# Patient Record
Sex: Male | Born: 1955
Health system: Southern US, Community
[De-identification: ages and names within clinical notes are randomized; demographics above are authoritative.]

## PROBLEM LIST (undated history)

## (undated) DIAGNOSIS — Z87442 Personal history of urinary calculi: Secondary | ICD-10-CM

## (undated) DIAGNOSIS — R0789 Other chest pain: Secondary | ICD-10-CM

## (undated) DIAGNOSIS — Z972 Presence of dental prosthetic device (complete) (partial): Secondary | ICD-10-CM

## (undated) DIAGNOSIS — N200 Calculus of kidney: Secondary | ICD-10-CM

## (undated) DIAGNOSIS — R0609 Other forms of dyspnea: Secondary | ICD-10-CM

## (undated) DIAGNOSIS — Z8489 Family history of other specified conditions: Secondary | ICD-10-CM

## (undated) DIAGNOSIS — G629 Polyneuropathy, unspecified: Secondary | ICD-10-CM

## (undated) DIAGNOSIS — N400 Enlarged prostate without lower urinary tract symptoms: Secondary | ICD-10-CM

## (undated) DIAGNOSIS — K219 Gastro-esophageal reflux disease without esophagitis: Secondary | ICD-10-CM

## (undated) DIAGNOSIS — R0600 Dyspnea, unspecified: Secondary | ICD-10-CM

## (undated) DIAGNOSIS — K08109 Complete loss of teeth, unspecified cause, unspecified class: Secondary | ICD-10-CM

## (undated) DIAGNOSIS — Z9889 Other specified postprocedural states: Secondary | ICD-10-CM

## (undated) DIAGNOSIS — M1990 Unspecified osteoarthritis, unspecified site: Secondary | ICD-10-CM

## (undated) DIAGNOSIS — R112 Nausea with vomiting, unspecified: Secondary | ICD-10-CM

## (undated) DIAGNOSIS — E785 Hyperlipidemia, unspecified: Secondary | ICD-10-CM

## (undated) DIAGNOSIS — I10 Essential (primary) hypertension: Secondary | ICD-10-CM

## (undated) DIAGNOSIS — Z973 Presence of spectacles and contact lenses: Secondary | ICD-10-CM

## (undated) DIAGNOSIS — R000 Tachycardia, unspecified: Secondary | ICD-10-CM

## (undated) HISTORY — PX: CYSTOSCOPY W/ URETERAL STENT PLACEMENT: SHX1429

## (undated) HISTORY — PX: TONSILLECTOMY AND ADENOIDECTOMY: SUR1326

## (undated) HISTORY — DX: Polyneuropathy, unspecified: G62.9

## (undated) HISTORY — PX: LUMBAR DISC SURGERY: SHX700

## (undated) HISTORY — PX: CARDIOVASCULAR STRESS TEST: SHX262

## (undated) HISTORY — PX: TOTAL SHOULDER ARTHROPLASTY: SHX126

## (undated) HISTORY — DX: Unspecified osteoarthritis, unspecified site: M19.90

## (undated) HISTORY — PX: ANTERIOR CERVICAL DECOMP/DISCECTOMY FUSION: SHX1161

## (undated) HISTORY — DX: Other forms of dyspnea: R06.09

## (undated) HISTORY — PX: CYSTOSCOPY/RETROGRADE/URETEROSCOPY/STONE EXTRACTION WITH BASKET: SHX5317

## (undated) HISTORY — DX: Dyspnea, unspecified: R06.00

## (undated) HISTORY — PX: LUMBAR FUSION: SHX111

## (undated) HISTORY — PX: TRANSTHORACIC ECHOCARDIOGRAM: SHX275

## (undated) HISTORY — DX: Hyperlipidemia, unspecified: E78.5

## (undated) HISTORY — PX: SPINE SURGERY: SHX786

---

## 1995-04-27 HISTORY — PX: LUMBAR SPINE SURGERY: SHX701

## 1999-08-25 ENCOUNTER — Encounter: Admission: RE | Admit: 1999-08-25 | Discharge: 1999-09-25 | Payer: Self-pay | Admitting: Endocrinology

## 1999-11-29 ENCOUNTER — Emergency Department (HOSPITAL_COMMUNITY): Admission: EM | Admit: 1999-11-29 | Discharge: 1999-11-29 | Payer: Self-pay | Admitting: Emergency Medicine

## 1999-11-29 ENCOUNTER — Encounter: Payer: Self-pay | Admitting: Emergency Medicine

## 1999-12-04 ENCOUNTER — Encounter: Payer: Self-pay | Admitting: Emergency Medicine

## 1999-12-04 ENCOUNTER — Inpatient Hospital Stay (HOSPITAL_COMMUNITY): Admission: EM | Admit: 1999-12-04 | Discharge: 1999-12-06 | Payer: Self-pay | Admitting: Emergency Medicine

## 1999-12-04 ENCOUNTER — Encounter: Payer: Self-pay | Admitting: Urology

## 1999-12-07 ENCOUNTER — Encounter: Admission: RE | Admit: 1999-12-07 | Discharge: 1999-12-07 | Payer: Self-pay | Admitting: Urology

## 1999-12-07 ENCOUNTER — Encounter: Payer: Self-pay | Admitting: Urology

## 1999-12-14 ENCOUNTER — Ambulatory Visit (HOSPITAL_COMMUNITY): Admission: RE | Admit: 1999-12-14 | Discharge: 1999-12-14 | Payer: Self-pay | Admitting: Urology

## 2000-04-15 ENCOUNTER — Ambulatory Visit (HOSPITAL_COMMUNITY): Admission: RE | Admit: 2000-04-15 | Discharge: 2000-04-15 | Payer: Self-pay | Admitting: Neurosurgery

## 2000-04-15 ENCOUNTER — Encounter: Payer: Self-pay | Admitting: Neurosurgery

## 2000-05-10 ENCOUNTER — Ambulatory Visit (HOSPITAL_COMMUNITY): Admission: RE | Admit: 2000-05-10 | Discharge: 2000-05-10 | Payer: Self-pay | Admitting: Neurosurgery

## 2000-05-10 ENCOUNTER — Encounter: Payer: Self-pay | Admitting: Neurosurgery

## 2000-05-24 ENCOUNTER — Inpatient Hospital Stay (HOSPITAL_COMMUNITY): Admission: RE | Admit: 2000-05-24 | Discharge: 2000-05-26 | Payer: Self-pay | Admitting: Neurosurgery

## 2000-05-24 ENCOUNTER — Encounter: Payer: Self-pay | Admitting: Neurosurgery

## 2000-07-04 ENCOUNTER — Encounter: Admission: RE | Admit: 2000-07-04 | Discharge: 2000-07-04 | Payer: Self-pay | Admitting: Neurosurgery

## 2000-07-04 ENCOUNTER — Encounter: Payer: Self-pay | Admitting: Neurosurgery

## 2000-08-12 ENCOUNTER — Encounter: Payer: Self-pay | Admitting: Urology

## 2000-08-12 ENCOUNTER — Encounter: Admission: RE | Admit: 2000-08-12 | Discharge: 2000-08-12 | Payer: Self-pay | Admitting: Urology

## 2000-08-15 ENCOUNTER — Encounter: Admission: RE | Admit: 2000-08-15 | Discharge: 2000-08-15 | Payer: Self-pay | Admitting: Neurosurgery

## 2000-08-15 ENCOUNTER — Encounter: Payer: Self-pay | Admitting: Neurosurgery

## 2000-08-16 ENCOUNTER — Ambulatory Visit (HOSPITAL_BASED_OUTPATIENT_CLINIC_OR_DEPARTMENT_OTHER): Admission: RE | Admit: 2000-08-16 | Discharge: 2000-08-16 | Payer: Self-pay | Admitting: Orthopedic Surgery

## 2000-08-19 ENCOUNTER — Encounter: Admission: RE | Admit: 2000-08-19 | Discharge: 2000-09-15 | Payer: Self-pay | Admitting: Neurosurgery

## 2000-10-06 ENCOUNTER — Ambulatory Visit (HOSPITAL_COMMUNITY): Admission: RE | Admit: 2000-10-06 | Discharge: 2000-10-06 | Payer: Self-pay | Admitting: Neurosurgery

## 2001-05-22 ENCOUNTER — Encounter: Admission: RE | Admit: 2001-05-22 | Discharge: 2001-05-22 | Payer: Self-pay | Admitting: Neurosurgery

## 2001-05-22 ENCOUNTER — Encounter: Payer: Self-pay | Admitting: Neurosurgery

## 2001-06-13 ENCOUNTER — Ambulatory Visit (HOSPITAL_COMMUNITY): Admission: RE | Admit: 2001-06-13 | Discharge: 2001-06-13 | Payer: Self-pay | Admitting: Neurosurgery

## 2001-06-13 ENCOUNTER — Encounter: Payer: Self-pay | Admitting: Neurosurgery

## 2001-06-26 ENCOUNTER — Encounter: Payer: Self-pay | Admitting: Neurosurgery

## 2001-06-29 ENCOUNTER — Inpatient Hospital Stay (HOSPITAL_COMMUNITY): Admission: RE | Admit: 2001-06-29 | Discharge: 2001-07-03 | Payer: Self-pay | Admitting: Neurosurgery

## 2001-06-29 ENCOUNTER — Encounter: Payer: Self-pay | Admitting: Neurosurgery

## 2003-08-17 ENCOUNTER — Emergency Department (HOSPITAL_COMMUNITY): Admission: EM | Admit: 2003-08-17 | Discharge: 2003-08-17 | Payer: Self-pay | Admitting: Emergency Medicine

## 2004-03-09 ENCOUNTER — Ambulatory Visit (HOSPITAL_COMMUNITY): Admission: RE | Admit: 2004-03-09 | Discharge: 2004-03-10 | Payer: Self-pay | Admitting: Neurosurgery

## 2005-11-01 ENCOUNTER — Observation Stay (HOSPITAL_COMMUNITY): Admission: AD | Admit: 2005-11-01 | Discharge: 2005-11-03 | Payer: Self-pay | Admitting: Neurosurgery

## 2005-11-01 HISTORY — PX: CERVICAL FUSION: SHX112

## 2006-06-06 ENCOUNTER — Inpatient Hospital Stay (HOSPITAL_COMMUNITY): Admission: RE | Admit: 2006-06-06 | Discharge: 2006-06-08 | Payer: Self-pay | Admitting: Neurosurgery

## 2006-06-06 HISTORY — PX: POSTERIOR FUSION CERVICAL SPINE: SUR628

## 2006-08-30 ENCOUNTER — Encounter (INDEPENDENT_AMBULATORY_CARE_PROVIDER_SITE_OTHER): Payer: Self-pay | Admitting: Specialist

## 2006-08-30 ENCOUNTER — Ambulatory Visit (HOSPITAL_COMMUNITY): Admission: RE | Admit: 2006-08-30 | Discharge: 2006-08-30 | Payer: Self-pay | Admitting: *Deleted

## 2007-11-10 ENCOUNTER — Ambulatory Visit (HOSPITAL_COMMUNITY): Admission: RE | Admit: 2007-11-10 | Discharge: 2007-11-10 | Payer: Self-pay | Admitting: Urology

## 2008-04-29 ENCOUNTER — Ambulatory Visit (HOSPITAL_COMMUNITY): Admission: RE | Admit: 2008-04-29 | Discharge: 2008-04-30 | Payer: Self-pay | Admitting: Neurosurgery

## 2008-07-15 ENCOUNTER — Emergency Department (HOSPITAL_COMMUNITY): Admission: EM | Admit: 2008-07-15 | Discharge: 2008-07-15 | Payer: Self-pay | Admitting: Emergency Medicine

## 2008-09-26 ENCOUNTER — Encounter: Admission: RE | Admit: 2008-09-26 | Discharge: 2008-09-26 | Payer: Self-pay | Admitting: Neurosurgery

## 2008-10-15 ENCOUNTER — Inpatient Hospital Stay (HOSPITAL_COMMUNITY): Admission: RE | Admit: 2008-10-15 | Discharge: 2008-10-18 | Payer: Self-pay | Admitting: Neurosurgery

## 2009-07-27 HISTORY — PX: IRRIGATION AND DEBRIDEMENT ABSCESS: SHX5252

## 2009-08-06 ENCOUNTER — Ambulatory Visit (HOSPITAL_BASED_OUTPATIENT_CLINIC_OR_DEPARTMENT_OTHER): Admission: RE | Admit: 2009-08-06 | Discharge: 2009-08-06 | Payer: Self-pay | Admitting: Orthopedic Surgery

## 2010-01-08 ENCOUNTER — Inpatient Hospital Stay (HOSPITAL_COMMUNITY): Admission: RE | Admit: 2010-01-08 | Discharge: 2010-01-09 | Payer: Self-pay | Admitting: Orthopedic Surgery

## 2010-07-09 LAB — URINALYSIS, ROUTINE W REFLEX MICROSCOPIC
Bilirubin Urine: NEGATIVE
Glucose, UA: NEGATIVE mg/dL
Hgb urine dipstick: NEGATIVE
Ketones, ur: NEGATIVE mg/dL
Nitrite: NEGATIVE
Protein, ur: NEGATIVE mg/dL
Specific Gravity, Urine: 1.02 (ref 1.005–1.030)
Urobilinogen, UA: 0.2 mg/dL (ref 0.0–1.0)
pH: 5.5 (ref 5.0–8.0)

## 2010-07-09 LAB — COMPREHENSIVE METABOLIC PANEL
ALT: 21 U/L (ref 0–53)
AST: 27 U/L (ref 0–37)
Albumin: 4.2 g/dL (ref 3.5–5.2)
Alkaline Phosphatase: 106 U/L (ref 39–117)
BUN: 7 mg/dL (ref 6–23)
CO2: 30 mEq/L (ref 19–32)
Calcium: 9.4 mg/dL (ref 8.4–10.5)
Chloride: 102 mEq/L (ref 96–112)
Creatinine, Ser: 0.9 mg/dL (ref 0.4–1.5)
GFR calc Af Amer: >60 mL/min (ref >60)
GFR calc non Af Amer: >60 mL/min (ref >60)
Glucose, Bld: 96 mg/dL (ref 70–99)
Potassium: 4.1 mEq/L (ref 3.5–5.1)
Sodium: 138 mEq/L (ref 135–145)
Total Bilirubin: 0.6 mg/dL (ref 0.3–1.2)
Total Protein: 6.8 g/dL (ref 6.0–8.3)

## 2010-07-09 LAB — SURGICAL PCR SCREEN
MRSA, PCR: NEGATIVE
Staphylococcus aureus: POSITIVE — AB

## 2010-07-09 LAB — CBC
HCT: 37.7 % — ABNORMAL LOW (ref 39.0–52.0)
Hemoglobin: 12.3 g/dL — ABNORMAL LOW (ref 13.0–17.0)
MCH: 28.3 pg (ref 26.0–34.0)
MCHC: 32.6 g/dL (ref 30.0–36.0)
MCV: 86.9 fL (ref 78.0–100.0)
Platelets: 168 10*3/uL (ref 150–400)
RBC: 4.34 MIL/uL (ref 4.22–5.81)
RDW: 14.1 % (ref 11.5–15.5)
WBC: 5 10*3/uL (ref 4.0–10.5)

## 2010-07-09 LAB — PROTIME-INR
INR: 1.02 (ref 0.00–1.49)
Prothrombin Time: 13.6 seconds (ref 11.6–15.2)

## 2010-07-09 LAB — APTT: aPTT: 28 seconds (ref 24–37)

## 2010-07-15 LAB — BASIC METABOLIC PANEL
BUN: 7 mg/dL (ref 6–23)
CO2: 29 mEq/L (ref 19–32)
Calcium: 9.3 mg/dL (ref 8.4–10.5)
Chloride: 103 mEq/L (ref 96–112)
Creatinine, Ser: 0.73 mg/dL (ref 0.4–1.5)
GFR calc Af Amer: >60 mL/min (ref >60)
GFR calc non Af Amer: >60 mL/min (ref >60)
Glucose, Bld: 114 mg/dL — ABNORMAL HIGH (ref 70–99)
Potassium: 3.8 mEq/L (ref 3.5–5.1)
Sodium: 139 mEq/L (ref 135–145)

## 2010-07-15 LAB — WOUND CULTURE

## 2010-07-15 LAB — ANAEROBIC CULTURE

## 2010-07-15 LAB — POCT HEMOGLOBIN-HEMACUE: Hemoglobin: 12 g/dL — ABNORMAL LOW (ref 13.0–17.0)

## 2010-08-03 LAB — URINALYSIS, ROUTINE W REFLEX MICROSCOPIC
Bilirubin Urine: NEGATIVE
Glucose, UA: NEGATIVE mg/dL
Hgb urine dipstick: NEGATIVE
Ketones, ur: NEGATIVE mg/dL
Nitrite: NEGATIVE
Protein, ur: NEGATIVE mg/dL
Specific Gravity, Urine: 1.018 (ref 1.005–1.030)
Urobilinogen, UA: 0.2 mg/dL (ref 0.0–1.0)
pH: 6 (ref 5.0–8.0)

## 2010-08-03 LAB — PROTIME-INR
INR: 1 (ref 0.00–1.49)
Prothrombin Time: 13.6 seconds (ref 11.6–15.2)

## 2010-08-03 LAB — BASIC METABOLIC PANEL
BUN: 19 mg/dL (ref 6–23)
CO2: 27 mEq/L (ref 19–32)
Calcium: 9.4 mg/dL (ref 8.4–10.5)
Chloride: 97 mEq/L (ref 96–112)
Creatinine, Ser: 1.12 mg/dL (ref 0.4–1.5)
GFR calc Af Amer: >60 mL/min (ref >60)
GFR calc non Af Amer: >60 mL/min (ref >60)
Glucose, Bld: 104 mg/dL — ABNORMAL HIGH (ref 70–99)
Potassium: 4.4 mEq/L (ref 3.5–5.1)
Sodium: 133 mEq/L — ABNORMAL LOW (ref 135–145)

## 2010-08-03 LAB — TYPE AND SCREEN
ABO/RH(D): O POS
Antibody Screen: NEGATIVE

## 2010-08-03 LAB — CBC
HCT: 40.5 % (ref 39.0–52.0)
Hemoglobin: 13.7 g/dL (ref 13.0–17.0)
MCHC: 33.8 g/dL (ref 30.0–36.0)
MCV: 93.4 fL (ref 78.0–100.0)
Platelets: 167 10*3/uL (ref 150–400)
RBC: 4.33 MIL/uL (ref 4.22–5.81)
RDW: 13.6 % (ref 11.5–15.5)
WBC: 6.6 10*3/uL (ref 4.0–10.5)

## 2010-08-03 LAB — APTT: aPTT: 28 seconds (ref 24–37)

## 2010-09-08 NOTE — Op Note (Signed)
NAME:  Dustin Carter, Dustin Carter NO.:  [id number]   MEDICAL RECORD NO.:  [id number]          PATIENT TYPE:  INP   LOCATION:  3030                         FACILITY:  [facility]   PHYSICIAN:  Clydene Fake, M.D.  DATE OF BIRTH:  1955/10/16   DATE OF PROCEDURE:  10/15/2008  DATE OF DISCHARGE:                               OPERATIVE REPORT   PREOPERATIVE DIAGNOSES:  Lumbar stenosis and spondylosis at L4-5 prior  with prior surgery and prior fusion above this.   POSTOPERATIVE DIAGNOSES:  Lumbar stenosis and spondylosis at L4-5 prior  with prior surgery and prior fusion above this.   PROCEDURE:  Redo decompressive laminectomy at L4 and L5 (2 levels),  posterior lumbar interbody fusion at L4, Saber interbody cages at 4-5,  revision at the left L3 and bilateral L4 pedicle screws with segmented  pedicle fixation at L3 through L5 with expedient screws, posterolateral  fusion at L3 through L5 (2 levels), autograft, same incision, allograft  with Infuse BMP.   SURGEON:  Clydene Fake, MD   ASSISTANT:  None.   ANESTHESIA:  General endotracheal tube anesthesia.   ESTIMATED BLOOD LOSS:  350 mL.   BLOOD GIVEN:  None.   DRAINS:  None.   COMPLICATIONS:  None.   REASON FOR PROCEDURE:  The patient is a 55 year old gentleman who has  had an L1 to L4 fusion about 9 years ago.  He has had  other multiple  lumbar surgeries in the past also.  He developed transitional syndrome  at 4-5 with increased spondylosis and stenosis.  The patient was brought  in for decompression and fusion.   PROCEDURE IN DETAIL:  The patient was brought into the operating room  and general anesthesia was induced.  The patient was placed in the prone  position on a Wilson frame with all pressure points padded.  The patient  was prepped and draped in sterile fashion.  A standard incision was  injected with 20 mL of 1% lidocaine with epinephrine.  Incision was then  made beside previous incision at the lower  lumbar spine from about L3  down to L5.  Incision was taken down to the fascia.  Hemostasis obtained  with Bovie cauterization.  Fascia was incised and subperiosteal  dissection was done over the L4 spinous process and lamina up to the  facets.  We dissected up cephalad.  We found the old pedicle screw  system and rods.  We uncovered the screws at L4 and L3 including the  rods and exposed that.  The patient had a Zimmer XP 360instrumentation  and explored options extending transplantation down at one level, but  could not see any good way any rod connectors would  work appropriately,  so a high-speed drill rod cutter was used to cut the rods of the right  side at 3-4, on the left side between 2-3.  We then removed the rods  between the pedicle screws at right side at L4 and left side at L3 and  4.  The four screws were loose bilaterally.  We removed the excess  screws on the left side and the screws broke and the distal end was  still a vertical body and  that was left in place.  Then, at the left  side of L3, we placed a larger XP pedicle screw.  This was 7-mm wide by  __________, 0.5 mm deeper screw and on the right-side at L4, we replaced  that screw with a larger screw 7 mm x 45 mm screw.  We then returned  back to the 4-5 interspace.  We dissected out laterally the transverse  process of the 4-5.  Self retaining retractor was then placed there and  decompressive laminectomy was done, decompressing most of spinous  process and lamina at the top part of  5, decompressing the central  canal in the L4 and L5 nerve roots bilaterally.  Aggressive medial  facetectomies also were done to make sure that the lateral gutter was  well decompressed.  We decompressed more than was needed for just  standard interbody fusion.  Disk space was incised bilateral at the 4-5  level and diskectomy done with pituitary rongeurs, we distracted the  interspace up to 11 mm with interspace distractors.  We  continued to  prepare the interspace for interbody fusion using curettes, pituitary  rongeurs, and various broaches from the Saber system and two 11 x 9 wide  Saber interbody cages were packed with Infuse BMP and autograft bone.  All the bone removed during laminectomy was cleaned from its soft tissue  cut into small pieces and this bone was then packed in in the interbody  cages, also packed in the interspace and then we tapped our cages into  position and explored thecal sac and nerve roots.  We had good  decompression of the central canal and the 4/5 nerve roots bilaterally.  Attention was then taken to the pedicle entry points at L5 .  It was  decorticated with high-speed drill.  Pedicle probe placed on the pedicle  was tapped up with a small ball probe to ensure we had good bony  circumference and pedicle screw placed, 6 x 50 mm screws were used at  L5.  Then, on the left-side at L4, we put a pedicle probe down pedicle  and anterior intervertebral body adjacent to the broken screw, we then  tapped this and placed a 6 wide by 50-mm screw there.  Rods were then  placed in the screw heads on the right side between 4 and 5, and at the  left side between L3, 4, and 5 and then locking nuts placed and final  tightened.  Final AP and lateral fluoroscopic imaging showed good  position of interbody cages pedicle screw fixation.  Decorticated  lateral facets and transverse processes of L3 through 5, packed the  sterile gutters with Infuse BMP and autograft bone for posterolateral  fusion from L3 to L5.   Next we explored the dura and nerve roots.  There was a questionable  area in the dura, but with exploration of this with hooks and Valsalva,  no CSF leak was seen and Duraseal was placed over this area for  added  protection.  Retractors removed.  We had good hemostasis.  Fascia closed  with #0 Vicryl interrupted sutures.  Subcutaneous tissue closed with 2-0  and 3-0 Vicryl interrupted  sutures.  Skin closed with benzoin and Steri-  Strips.  Dressing was placed.  The patient was placed back in supine  position, awoken from anesthesia, and transferred to recovery room  in a  stable condition.           ______________________________  Clydene Fake, M.D.     JRH/MEDQ  D:  10/15/2008  T:  10/16/2008  Job:  [id number]

## 2010-09-08 NOTE — Op Note (Signed)
NAME:  Dustin Carter, CHAIKIN                 ACCOUNT NO.:  [id number]   MEDICAL RECORD NO.:  [id number]          PATIENT TYPE:  AMB   LOCATION:  DAY                          FACILITY:  [facility]   PHYSICIAN:  Sigmund I. Patsi Sears, M.D.DATE OF BIRTH:  07-11-1955   DATE OF PROCEDURE:  11/10/2007  DATE OF DISCHARGE:                               OPERATIVE REPORT   PREOPERATIVE DIAGNOSIS:  Right ureteral stone.   POSTOPERATIVE DIAGNOSIS:  Negative right ureteroscopy.   PROCEDURE:  1. Cystourethroscopy.  2. Right retrograde pyelogram.  3. Right ureteroscopy, semi rigid.   ATTENDING PHYSICIAN:  Sigmund I. Patsi Sears, M.D.   RESIDENT PHYSICIAN:  Dr. Delman Kitten.   ANESTHESIA:  Was general.   INDICATIONS FOR PROCEDURE:  Dustin Carter is a 55 year old white male who  saw Dr. Patsi Sears at the beginning of July complaining of right-sided  abdominal pain.  CT stone study at that time demonstrated a 3.5 to 4 mm  obstructing stone in the right mid ureter with perinephric soft tissue  stranding secondary to interstitial backflow.  His pain has improved,  however, he does not recall passing a stone per se.  He has been on  medical expulsive therapy since.  He is being brought back today for  further evaluation and definitive management of his stone if indicated.   PROCEDURE IN DETAIL:  The patient was brought to the operating room and  placed in supine position.  He was correctly identified by his wristband  and appropriate time-out was taken.  IV antibiotics were administered  and general anesthesia was delivered.  Once adequately anesthetized he  was placed in dorsal lithotomy position with great care taken to  minimize the risk of peripheral neuropathy or compartment syndrome.  His  perineum was prepped and draped sterilely.  We began our procedure by  performing rigid cystourethroscopy with a 22 French rigid cystoscopic  sheath and 12 degree lens and sterile water as our irrigant.  His  urethra was  normal in its course and caliber.  On entering the bladder  clear urine was identified.  Pan cystoscopy did not reveal any stones or  urothelial abnormality.  Both ureteral orifices were noted to be in  their normal anatomic position effluxing clear urine.  His right  ureteral orifice was cannulated with a 6 French end-hole catheter and  right retrograde pyelogram was performed.  There was a mobile filling  defect consistent with a bubble but otherwise there were no fixed  filling defects or radiopacities suggestive of stone either in the  ureter or the kidney.  His ureter was widely patent throughout its  entire length without evidence of stricture.  We then advanced a sensor  tip guidewire through the cystoscope and directed it to the level of the  right renal pelvis using fluoroscopy.  We then removed the cystoscope  and performed semi-rigid ureteroscopy.  The ureteral orifice was not  significantly difficult to cannulate and again the right ureter was  widely patent up to the UPJ.  There were no strictures.  There was no  evidence of edema or urothelial injury  nor were there any stones seen.  Likewise we slowly backed out the semi-rigid ureteroscope taking a  second very careful look at the ureter and no abnormalities were seen.  Likewise we removed the ureteroscope and the guidewire and replaced the  cystoscope back into his bladder to drain his bladder from urine and  this marked the  end of our procedure.  He tolerated the procedure well.  There were no  complications.  He awoke from anesthesia and was taken to the recovery  room in stable condition.  Dr. Patsi Sears was present and participated  in all aspects of the case.     ______________________________  Dr [place] Constable I. Patsi Sears, M.D.  Electronically Signed    DW/MEDQ  D:  11/10/2007  T:  11/10/2007  Job:  [id number]

## 2010-09-08 NOTE — Op Note (Signed)
NAME:  Dustin Carter, Dustin Carter NO.:  [id number]   MEDICAL RECORD NO.:  [id number]          PATIENT TYPE:  OIB   LOCATION:  3535                         FACILITY:  [facility]   PHYSICIAN:  Clydene Fake, M.D.  DATE OF BIRTH:  April 12, 1956   DATE OF PROCEDURE:  04/29/2008  DATE OF DISCHARGE:                               OPERATIVE REPORT   PREOPERATIVE DIAGNOSES:  Herniated nucleus pulposus and spondylosis, C4-  5 with right-sided radiculopathy.   POSTOPERATIVE DIAGNOSES:  Herniated nucleus pulposus and spondylosis, C4-  5 with right-sided radiculopathy.   PROCEDURE:  Anterior cervical decompression, diskectomy, and fusion at  C4-5 with LifeNet allograft bone and Trestle anterior cervical plate.   SURGEON:  Clydene Fake, MD   ASSISTANT:  Coletta Memos, MD   ANESTHESIA:  General endotracheal tube anesthesia.   ESTIMATED BLOOD LOSS:  Minimal.   BLOOD GIVEN:  None.   DRAINS:  None.   COMPLICATIONS:  None.   REASON FOR PROCEDURE:  The patient is a 55 year old gentleman with neck  and right shoulder and arm pain and numbness.  He has had a prior fusion  at anterior and posterior C5 through T1.  MRI and x-ray were done  showing some decreased disk height, progressive at C4-C5 with some  movement between flexion and extension, showing some instability.  An  MRI shows biforaminal narrowing, worse at the right due to spondylitic  change and some disk herniation.  The patient was brought in for  decompression and fusion.   PROCEDURE IN DETAIL:  The patient was brought to the operating room.  General anesthesia was induced.  The patient was placed in 10-pound  halter traction, prepped and draped in sterile fashion.  Site of  incision was injected with 10 mL of 1% lidocaine with epinephrine.  An  incision was then made in the midline to the anterior border of the  sternocleidomastoid muscle on the left side.  The neck incision was  taken down to the platysma and hemostasis  was obtained with Bovie  cauterization.  The platysma was incised with the Bovie and blunt  dissection and taken through the anterior cervical fascia of the  anterior cervical spine.  Needle was placed at the interspace to the  previous plate and was entered at a level below.  X-rays were obtained  showing the needle was in the C4-5 interspace.  Disk space was incised  with 15-blade.  Partial diskectomy was performed and the needle was  removed.  Longus coli muscle was reflected laterally using the Bovie on  each side.  A self-retaining retractor was then placed and anterior  osteophytes were removed with Leksell rongeurs and Kerrison punches.  Diskectomy continued with pituitary rongeurs and curettes.  Microscope  was then brought in for microdissection.  Distraction pins were placed  in the C4-5 and the interspace was distracted and then the diskectomy  was continued with 1 and 2-mm Kerrison punches, removing posterior disk  osteophytes, ligament disk herniation, decompressing the central canal  and performing bilateral foraminotomies.  Once we had finished, we had  good  decompression of the central canal, each foramen, and nerve root.  We used high-speed drill to remove cartilaginous endplate, measured the  height of disk space to be 5 mm and 5 mm LifeNet allograft bone was  tapped into place and countersunk couple of millimeters.  Distraction  pins were removed.  Hemostasis was obtained with Gelfoam and thrombin.  Weight was removed from the traction.  Bone was firmly in place and the  Trestle anterior cervical plate was placed over the anterior cervical  spine and 2 screws was placed in C4 and 2 in the C5.  These were  tightened down.  Lateral x-rays obtained showing good position of plate,  screws, and bone plug at the C4-5 level.  Retractors were removed.  Hemostasis was obtained with bipolar cauterization.  We irrigated with  antibiotic solution and had good hemostasis and the  platysma was closed  with 3-0 Vicryl interrupted sutures.  Subcutaneous tissue closed with  same.  Skin was closed with benzoin and Steri-Strips.  A dressing was  placed.  The patient was placed in a cervical collar, awoken from  anesthesia, and transferred to recovery room in stable condition.           ______________________________  Clydene Fake, M.D.     JRH/MEDQ  D:  04/29/2008  T:  04/30/2008  Job:  [id number]

## 2010-09-11 NOTE — Op Note (Signed)
NAME:  Dustin Carter, Dustin Carter NO.:  [id number]   MEDICAL RECORD NO.:  [id number]          PATIENT TYPE:  AMB   LOCATION:  ENDO                         FACILITY:  [facility]   PHYSICIAN:  Georgiana Spinner, M.D.    DATE OF BIRTH:  03/31/1956   DATE OF PROCEDURE:  08/30/2006  DATE OF DISCHARGE:                               OPERATIVE REPORT   PROCEDURE:  Upper endoscopy.   INDICATIONS:  Gastroesophageal reflux disease.   ANESTHESIA:  Demerol 70 mg, Versed 7 mg.   PROCEDURE:  With the patient mildly sedated in the left lateral  decubitus position the Pentax videoscopic endoscope was inserted and  passed under direct vision through the esophagus which appeared normal  until we reached distal esophagus.  There was [address] of [place]  photographed and biopsied.  We entered into the stomach.  Fundus, body,  antrum, duodenal bulb, second portion of duodenum were visualized.  From  this point the endoscope was slowly withdrawn taking circumferential  views of duodenal mucosa until the endoscope had been pulled back into  the stomach placed in retroflexion to view the stomach from below.  The  endoscope was then straightened and withdrawn after taking biopsies of  the antrum which appeared mildly erythematous.  We took circumferential  views of the remaining gastric and esophageal mucosa.  The patient's  vital signs, pulse oximeter remained stable.  The patient tolerated  procedure well without apparent complications.   FINDINGS:  Changes of antrum and island of Barrett's esophagus seen,  both of which were photographed.  Await biopsy report.  The patient will  call me for results and follow-up with me as an outpatient.  Proceed to  colonoscopy.           ______________________________  Georgiana Spinner, M.D.     GMO/MEDQ  D:  08/30/2006  T:  08/30/2006  Job:  [id number]   cc:   Lonzo Cloud. Kriste Basque, MD

## 2010-09-11 NOTE — H&P (Signed)
[employer]  Patient:    Dustin Carter, Dustin Carter Visit Number: [id number] MRN: [id number]          Service Type: SUR Location: 3000 [id number] Attending Physician:  Colon Branch Dictated by:   Clydene Fake, M.D. Admit Date:  06/29/2001                           History and Physical  CHIEF COMPLAINT:  Back and leg pain, worse on the left.  HISTORY:  Patient is a 55 year old gentleman who has had multiple lumbar surgeries but continues having worsening in back pain and leg pain with problems doing any activity.  Pain increases if he walks, sits or stands.  He is having intermittent numbness in the left anterior thigh.  His activities are being diminished because of pain.  He was worked up with an MRI of the lumbar spine with flexion and extension.  He has a right-sided broad-based herniation at L1-2 causing stenosis.  At 2-3, he has spondylitic change, degenerative change and spondylolisthesis that does move between flexion and extension.  At 3-4, there is a left-sided recurrent disk herniation along with degenerative changes.  Patient will be admitted for decompression and fusion of L1 to L4.  PAST MEDICAL HISTORY:  Significant for hypertension and kidney stones.  PAST SURGICAL HISTORY:  Thoracic and lumbar diskectomies in January of 2002, right 2-3 diskectomy in 1997, hand surgery in 1990, kidney stone procedure in 2001.  CURRENT MEDICATIONS:  Zocor, Ziac, Prevacid, Celebrex, Neurontin, Elavil.  ALLERGIES:  No known drug allergies.  SOCIAL HISTORY:  He does not smoke or drink alcohol and is single.  REVIEW OF SYSTEMS:  Otherwise negative.  FAMILY HISTORY:  Significant for back problems, otherwise, noncontributory.  PHYSICAL EXAMINATION:  GENERAL:  Patient pleasant.  HEENT:  Unremarkable.  NECK:  Supple.  LUNGS:  Clear.  HEART:  Regular rhythm.  ABDOMEN:  Soft and nontender.  EXTREMITIES:  Intact.  No edema.  BACK AND NEUROLOGIC:   Exam shows negative straight leg raising.  Range of motion of the back is decreased in all directions and having increasing pain. Motor strength is intact.  Sensation may be a little decreased in left anterior thigh.  Gait is normal.  ASSESSMENT AND PLAN:  Patient with significant degenerative changes, L1 through 4 with herniated nucleus pulposus at 1-2 and 3-4 and unstable spondylolisthesis at 2-3.  Patient is brought in for a diskectomy and fusion. Dictated by:   Clydene Fake, M.D. Attending Physician:  Colon Branch DD:  06/29/01 TD:  06/30/01 Job: [id number]

## 2010-09-11 NOTE — Op Note (Signed)
[facility]  Patient:    KALA, GASSMANN                        MRN: [id number] Proc. Date: 12/14/99 Adm. Date:  [id number] Attending:  Laqueta Jean                           Operative Report  PREOPERATIVE DIAGNOSIS:  Left lower ureteral calculus.  POSTOPERATIVE DIAGNOSIS:  Left lower ureteral calculus.  OPERATION PERFORMED:  Cystourethroscopy, removal of left Double J catheter, ureteroscopy, basket extraction of left ureteral stone, placement of Xylocaine in the left ureter.  SURGEON:  Sigmund I. Patsi Sears, M.D.  ANESTHESIA:  General.  DESCRIPTION OF PROCEDURE:  After appropriate preanesthesia, the patient is brought to the operating room and placed on the operating table in the dorsosupine position where general LMA anesthesia was introduced.  He was then replaced in the low Allen stirrup dorsolithotomy position where the pubis was prepped with Betadine solution and draped in the usual fashion.  Cystoscopy was accomplished and revealed the left Double J catheter.  C-arm fluoroscopy did not reveal the stone.  The left Double J catheter was removed and the guidewire was passed in the ureter and a retrograde pyelogram was performed which showed the stone in the left lower ureter.  Ureteroscopy was accomplished and the stone was basket extracted.  Following this, repeat ureteroscopy revealed no further stone.  Xylocaine jelly was placed in the ureter.  The bladder was drained of fluid and Xylocaine was placed in the urethra.  The patient was awakened and taken to the recovery room after IV Toradol given. DD:  12/14/99 TD:  12/15/99 Job: [id number]

## 2010-09-11 NOTE — Op Note (Signed)
NAME:  Dustin, Carter NO.:  [id number]   MEDICAL RECORD NO.:  [id number]          PATIENT TYPE:  AMB   LOCATION:  ENDO                         FACILITY:  [facility]   PHYSICIAN:  Georgiana Spinner, M.D.    DATE OF BIRTH:  [birth year]   DATE OF PROCEDURE:  DATE OF DISCHARGE:  08/30/2006                               OPERATIVE REPORT   PROCEDURE:  Colonoscopy.   INDICATION:  Screening colonoscopy.   ANESTHESIA:  Versed 2.5 mg.   DESCRIPTION OF PROCEDURE:  With the patient mildly sedated in the left  lateral decubitus position, rectal examination was attempted.  Subsequently the Pentax videoscopic colonoscope was inserted into the  rectum, passed under direct vision to the cecum identified by the  ileocecal valve and base of the cecum, both of which were photographed.  From this point, the colonoscope was slowly withdrawn, taking  circumferential views of the colonic mucosa, stopping only in the rectum  which appeared normal on direct, showed hemorrhoids in retroflexed view.  The endoscope was straightened and withdrawn.  The patient's vital signs  and pulse oximetry remained stable.  The patient tolerated the procedure  well with no apparent complications.   FINDINGS:  Internal hemorrhoids, otherwise, an unremarkable exam.   PLAN:  See endoscopy note for further details.           ______________________________  Georgiana Spinner, M.D.     GMO/MEDQ  D:  11/10/2006  T:  11/10/2006  Job:  [id number]

## 2010-09-11 NOTE — Op Note (Signed)
NAMETAVEN, STRITE NO.:  [id number]   MEDICAL RECORD NO.:  [id number]          PATIENT TYPE:  INP   LOCATION:  2899                         FACILITY:  [facility]   PHYSICIAN:  Clydene Fake, M.D.  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   POSTOPERATIVE DIAGNOSIS:  Nonunion, C6-7, 7-1, after anterior  decompression and fusion.   PROCEDURE:  Posterior fusion, C6-7 and C7-T1, autograft through the same  incision, allograft, Infuse, bone morphogenic protein, and  instrumentation with Mountaineer lateral mass and pedicle screws,  segmented, C6 through T1.   SURGEON:  Clydene Fake, M.D.   ASSISTANT:  Cristi Loron, M.D.   ANESTHESIA:  General endotracheal tube anesthesia.   ESTIMATED BLOOD LOSS:  Minimal.   BLOOD GIVEN:  None.   DRAINS:  None.   COMPLICATIONS:  None.   REASON FOR PROCEDURE:  The patient is a 55 year old gentleman, who  underwent 2-level ACDF at 6-7 and 7-1 in July, and a prior 5-6 ACDF 8  years before.  He has had decreased radicular symptoms, but has had  increasing neck pain, and followup x-rays showed some evidence between  bone grafts and vertebral bodies, 6-7 and 7-1, and a few millimeters of  screw pullout at the T1 level.  MRI of his neck was done, showing no  nerve root compression, delayed healing and screws starting to back out  at T1 and it was thought best to support him posteriorly and do a  posterior fusion of these levels.  The patient was brought in for this  procedure.   PROCEDURE IN DETAIL:  The patient was brought to the operating room and  general anesthesia was induced.  The patient was placed in [place] head  pins on the radiolucent frame and placed in a prone position with all  pressure points well padded.  The patient was prepped and draped  in  sterile fashion.  The site of the incision was injected with 10 mL of 1%  lidocaine with epinephrine.  A needle was placed in the midline of the  cervical spine and fluoroscopy was used to insure that the needle was  pointing at the C6-7 spinous process.  An incision was then made  centered where the needle was and the incision taken down to the fascia  and hemostasis obtained with Bovie cauterization.  The fascia was  incised with subperiosteal dissection over the spinous processes and the  laminae and out to the lateral masses.  T1 was found.  We dissected out  to C7 and C6.  We had to expose the lateral masses again of C6, 7 and  T1.  We used lateral fluoroscopic imaging with markers on the facets of  5-6 and 6-7, and the landmarks were used for the anterior plate, where  the screws were in C6 and T1, in order to confirm our positioning, along  with just the intraoperative view where  the first screw joins to T1.  We  used an awl to start a hole on the right side at C6 and C7, just  inferior medial to the midpoint of the lateral mass.  At each pedicle,  we used a drill and drilled up and out both areas.  We tapped and placed  12-mm screws, first on the right side, and then on the left side.  We  then used A/P fluoroscopy to find the T1 pedicles, placed a marker, and  I found the pedicle and started drilling down the pedicle.  I placed a  marker within the pedicle and continued using fluoroscopy as a guide,  and we had good bony edges all the way down and a good appearance on the  A/P fluoroscopy.  We tapped the hole to 22, and 24-mm screws were placed  in the T1 pedicle, first on the right and then on the left.  We then  used a high-speed drill to decorticated the lamina and to roughen up the  facets and decorticate the facets.  Rods were placed over the screws and  locking nuts were placed and these were tightened down.  Infuse BMP was  started at the lateral gutters and posteriorly  with some autograft bone  that was taken from around the facets from all our drillings, and was  placed with the BMP in the conduits.  Allograft substitute was placed  over this.  The retractors were removed.  The fascia was closed with 0  Vicryl interrupted suture.  The subcutaneous tissue was closed with 2-0  and 3-0 Vicryl interrupted sutures.  The skin was closed with benzoin  and Steri-Strips.  A dressing was placed.  The patient was placed back  in supine position, removed from the head pins, placed in an Aspen  cervical collar, and was transferred to the recovery room in stable  condition.           ______________________________  Clydene Fake, M.D.     JRH/MEDQ  D:  06/06/2006  T:  06/06/2006  Job:  [id number]

## 2010-09-11 NOTE — Op Note (Signed)
[place]  Patient:    Dustin Carter, Dustin Carter                        MRN: [id number] Proc. Date: 05/24/00 Adm. Date:  [id number] Attending:  Colon Branch                           Operative Report  DIAGNOSES: 1. Lumbar stenosis L3-4. 2. Left T9-10 herniated nucleus pulposus causing myelopathy and radiculopathy.  PROCEDURES: 1. Left L3-4 decompressive semihemilaminectomy and a diskectomy. 2. Left T9-10 transcostovertebral diskectomy. 3. Interbody fusion at T9-10 with autograft bone from same incision and    foreign microdissection with microscope.  SURGEON:  Clydene Fake, M.D.  ASSISTANT:  Izell Hartselle. Elesa Hacker, M.D.  ANESTHESIA:  General endotracheal anesthesia.  ESTIMATED BLOOD LOSS:  200 cc.  BLOOD GIVEN:  None.  DRAINS:  None.  COMPLICATIONS:  None.  REASON FOR PROCEDURE:  Patient is a 55 year old gentleman, who has had low-back pain and left leg pain and numbness and weakness with some left mid back pain with radiating pain around his lower rib cage.  Work-up included MRI of the lumbar spine and then a myelogram of the lumbar and thoracic spine. He was found to have severe stenosis at L3-4 worse on the left going along with his left symptoms due to hypertrophic ligament, facet hypertrophy, disk bulging.  Patient was also found to have a left T9-10 disk herniation that was causing some stenosis there and deforming the cord.  On examination, the patient was weak in his left leg.  It was mildly myelopathic and he was brought in for surgery.  DESCRIPTION OF PROCEDURE:  Patient was brought in the operating room, general anesthesia was induced.  Patient was placed on the Wilson frame in a prone position with all pressure points padded.  Incision was then made at the site of previous incision and scar in the midline of his lumbar spine and just slightly inferior to this.  Incision taken down to the fascia and hemostasis obtained with Bovie  cauterization. Subperiosteal dissection was done over the L3-4 spinous processes out to the facet and self-retaining retractor system was placed.  Meanwhile, marker was placed at the 3-4 interspace and x-ray obtained confirming the levels.  First x-ray prior to placing the retractor showed we were at the 2-3 level and we moved down and were at the 3-4 level on the next x-ray.  Drill and Kerrison punches were used then to perform a semihemilaminectomy of L3-4 taking off the inferior part of L3 and the superior part of the L4 lamina and along with the medial facetectomy was performed.  We also undercut the spinous process trying to decompress the canal as much as possible.  Attention was then taken in a lateral margin, where an enlarged ligament was removed.  Hemostasis in the epidural space was obtained with bipolar cauterization and Gelfoam and thrombin.  Disk space was explored and a large disk bulge or possibly even subligamentous herniation was seen and the disk space.  Microscope was brought into the field for the microdissection during this epidural exploration.  Disk space was incised with a 15-blade and diskectomy performed with pituitary rongeurs and curets.  After this, we had greatly decreased the stenosis at this level.  The nerve roots coming out above and below this level looked good.  We then used some curets to remove contralateral ligament from  the underside of lamina.  This also helped to decrease the stenosis at this level.  Gelfoam and thrombin was then placed over the dura.  Marker was placed on the L2 spinous processes and then lateral x-rays were obtained with marker up in the thoracic spine and T9 area was found.  Incision was then made centered over the T9-10 interspace and T8 through T10 spinous processes were exposed.  Hemostasis obtained with Bovie cauterization.  Subperiosteal dissection was done over the left side of the T8, 9, 10 spinous processes and lamina out  to the facets.  The transverse processes of T9, 10 and 11 were all found and we centered the disk space of 9-10 was under the T10 transverse process.  We then confirmed position with A/P and lateral imaging.  A/P imaging was through fluoroscopy.  At this point, we dissected the T10 transverse process and resected it.  This was saved to be used later in the case for fusion.  Microscope was then brought in for microdissection and using micro drill we drilled the rest of the transverse process and performed a lateral facetectomy.  We drilled down to the pedicle and the left T10 pedicle was then drilled out at its superior and lateral margins.  The T10 rib was then drilled at the posterior margin entering the costovertebral joint, but leaving the anterior margin of the rib intact.  We continued using A/P imaging to make sure we were at the appropriate disk space and keeping our orientation.  We then drilled into the vertebral body of T10 at the end plate the J8-11 disk.  We were then able to drill into the center of the disk and start removing the disk with pituitary rongeurs.  We then used curets and were able to push the herniated disk material down from near the dura into the cavity of the disk space and drilled out superior margin of the T10 vertebra.  As we did this, we did remove free fragment of disk herniation. After we were satisfied with the decompression of the thecal sac and nerve root on that side, we irrigated with antibiotic solution.  We decorticated T9 endplate and we cleaned the transverse process that was removed earlier in the case, cut it to size and tapped that into place in the T9-10 interbody space. This was tightly impacted.  A/P and lateral imaging was then obtained showing good position of the graft.  Wound was irrigated with antibiotic solution and retractors were removed and the fascia closed with 0 Vicryl interrupted sutures, subcutaneous tissue was closed with 2-0  and 3-0 Vicryl interrupted sutures.  Note, prior to closure, Valsalva maneuver was done and no spinal fluid was seen leaking.  We then filled the incision with irrigation and  valsalved again and no bubbles showed up showing no sign of any pneumothorax. Closure was then as previously dictated.  Skin was then closed with staples and the lumbar incision after we got positioning of our incision in the thoracic spine, we closed the lumbar incision with 0 Vicryl interrupted sutures in the fascia, 0, 2-0 and 3-0 Vicryl interrupted sutures in the subcutaneous tissue prior to continuing the thoracic diskectomy.  At the end of the case, so we stapled the skin along with the second incision.  Dressing was placed over both incisions and patient was placed back in a supine position, awoken from anesthesia and transferred to recovery room. DD:  05/24/00 TD:  05/24/00 Job: [id number]

## 2010-09-11 NOTE — Op Note (Signed)
[facility]  Patient:    Dustin Carter, Dustin Carter                        MRN: [id number] Proc. Date: 01/12/00 Adm. Date:  [id number] Disc. Date: [id number] Attending:  Laqueta Jean                           Operative Report  DATE OF BIRTH:  1956/03/25  PREOPERATIVE DIAGNOSIS:  Temperature of 102, white count 13.6, with two stones in the left ureter.  POSTOPERATIVE DIAGNOSIS:  Temperature of 102, white count 13.6, with two stones in the left ureter.  PROCEDURE:  Cystoscopy and left double-J stent placement.  SURGEON:  Verl Dicker, M.D.  ANESTHESIA:  General.  DRAINS:  A 6-French 26 cm stent.  SPECIMENS:  None.  COMPLICATIONS:  None.  INDICATIONS:  See Dr. Hollie Beach notes from December 04, 1999.  DESCRIPTION OF PROCEDURE:  The patient was prepped and draped in the dorsolithotomy position after institution of an adequate level of general anesthesia.  A well-lubricated 21-French panendoscope was gently inserted at the urethral meatus.  Normal urethra and sphincter and nonobstructive prostate.  There was edema around the left orifice and clear efflux at the right orifice.  Right retrograde showed normal course and caliber of the ureter, pelvis, and calices.  Left retrograde showed stone in the distal ureter with an additional stone in the proximal ureter.  A guidewire was negotiated beyond the distal ureteral stone with immediate efflux of grossly purulent urine from the left ureteral orifice.  The decision was made to make no attempt to manipulate the stone.  Once the guidewire coiled nicely in the upper pole calices, a 6-French 26 cm double-J stent was passed over the indwelling guidewire with prompt efflux of cloudy urine through the fenestrations.  The bladder was drained and the cystoscope was removed.  The patient was returned to recovery in satisfactory condition. DD:  01/12/00 TD:  01/13/00 Job: [id number]

## 2010-09-11 NOTE — Op Note (Signed)
NAME:  Dustin Carter, NEIDHARDT NO.:  [id number]   MEDICAL RECORD NO.:  [id number]          PATIENT TYPE:  AMB   LOCATION:  ENDO                         FACILITY:  [facility]   PHYSICIAN:  Georgiana Spinner, M.D.    DATE OF BIRTH:  [birth year]   DATE OF PROCEDURE:  08/30/2006  DATE OF DISCHARGE:                               OPERATIVE REPORT   PROCEDURE:  Colonoscopy.   INDICATIONS:  Colon cancer screening.   ANESTHESIA:  None further given.   PROCEDURE:  With the patient mildly sedated in the left lateral  decubitus position, the Pentax videoscopic colonoscope was inserted into  the rectum after normal rectal exam was performed, and passed under  direct vision to the cecum, identified by ileocecal valve and  appendiceal orifice, both of which were photographed.  From this point,  the colonoscope was slowly withdrawn, taking circumferential views of  colonic mucosa, stopping only in the rectum, which appeared normal on  direct, and showed hemorrhoids on retroflexed view.  The endoscope was  straightened and withdrawn.  The patient's vital signs and pulse  oximeter remained stable.  The patient tolerated the procedure well  without apparent complications.   FINDINGS:  Internal hemorrhoids, otherwise an unremarkable examination.   PLAN:  Consider repeat examination in the next 5 to 10 years.           ______________________________  Georgiana Spinner, M.D.     GMO/MEDQ  D:  08/30/2006  T:  08/30/2006  Job:  [id number]   cc:   Lonzo Cloud. Kriste Basque, MD

## 2010-09-11 NOTE — Op Note (Signed)
[facility]  Patient:    Dustin Carter, Dustin Carter                        MRN: [id number] Proc. Date: 12/05/99 Adm. Date:  [id number] Disc. Date: [id number] Attending:  Laqueta Jean                           Operative Report  DATE OF BIRTH:  10-24-1955  PREOPERATIVE DIAGNOSES:  Left ureteral calculi.  POSTOPERATIVE DIAGNOSES:  Left ureteral calculi.  OPERATION:  Cystoscopy, left double-J stent placement.  SURGEON:  Verl Dicker, M.D.  ANESTHESIA:  General.  DRAINS:  6 French 26 cm stent.  COMPLICATIONS:  None.  DESCRIPTION OF PROCEDURE:  The patient was prepped and draped in the dorsal lithotomy position after institution of an adequate level of general anesthesia.  A well-lubricated 21 French panendoscope was gently inserted at the urethral meatus.  Normal urethra and sphincter.  Nonobstructed prostate. Right retrograde showed normal course and caliber of the ureter, pelvis and calyces with prompt drainage at 3-5 minutes.  Guidewire was introduced at the left ureteral orifice.  It was negotiated beyond what appeared to be a distal ureteral calculus with immediate appearance of purulent urine at the left ureteral orifice.  No attempt was made to manipulate the stone.  End-hole catheter was advanced over the guidewire.  Several cc of contrast were then lightly injected which faintly outlined the hydronephrotic proximal ureter and calyces.  Guidewire was then advanced in the upper pole calyces and a 6 French 26 cm double-J stent was advanced over the guidewire with excellent pigtail formation on guidewire removal.  There were fenestrations of cloudy urine through the holes of the double-J stent.  The bladder was drained.  The cystoscope was removed.  The patient was returned to recovery in satisfactory condition. DD:  01/29/00 TD:  01/31/00 Job: [id number]

## 2010-09-11 NOTE — Op Note (Signed)
NAME:  GUSTIN, ZOBRIST                 ACCOUNT NO.:  [id number]   MEDICAL RECORD NO.:  [id number]          PATIENT TYPE:  AMB   LOCATION:  SDS                          FACILITY:  [facility]   PHYSICIAN:  Clydene Fake, M.D.  DATE OF BIRTH:  03-29-1956   DATE OF PROCEDURE:  11/01/2005  DATE OF DISCHARGE:                                 OPERATIVE REPORT   PREOPERATIVE DIAGNOSES:  1.  Herniated nucleus pulposus and spondylosis of cervical 6 and 7, and      cervical 7, thoracic 1 with,  2.  Left-sided radiculopathy.   POSTOPERATIVE DIAGNOSES:  1.  Herniated nucleus pulposus and spondylosis of cervical 6 and 7, and      cervical 7, thoracic 1 with,  2.  Left-sided radiculopathy.   OPERATION PERFORMED:  1.  Anterior cervical decompression,  2.  Diskectomy,  3.  Fusion at cervical 6 and 7, and cervical 7, thoracic 2 with LifeSite      allograft bone, Eagle anterior cervical plate; and,  4.  Removal of Eagle anterior cervical plate from cervical 5, 6.   SURGEON:  Clydene Fake, M.D.   ASSISTANT:  Eliane Decree, M.D.   ANESTHESIA:  General endotracheal tube anesthesia.   ESTIMATED BLOOD LOSS:  The estimated blood loss was 400 mL.  Blood given;  none.   DRAINS:  Jackson-Pratt drain placed.   COMPLICATIONS:  The complications were none.   REASON FOR THE SURGERY:  The patient is a 55 year old gentleman who two  years ago underwent cervical 5, 6  anterior cervical fusion who has  developed more and more neck pain, left-sided radicular symptoms, changes in  the foraminal nerve root, as well as disk protrusions at C6, 7 and C7, T1,  worse on the left.  The patient decided to undergo decompression and fusion.   OPERATION IN DETAIL:  The patient was brought into the operating room and  general anesthesia was induced.  The patient was placed in a Halter traction  of 10 pounds.  He was then prepped and draped in a sterile fashion.  The  site of the incision was injeceted with 10 mL of 1%  lidocaine with  epinephrine.  The site of the incision was in the site of the scar from the  previous surgery in the midline in the anterior border of the  sternocleidomastoid muscle on the left side.  The neck incision was taken  down deep to the platysma and hemostasis was obtained with Bovie  cauterization.  The platysma was incised with the Bovie and using blunt  dissection it was taken down to though the anterior cervical fascia to the  anterior cervical spine.  We went down below the omohyoid.  We were able to  get down to the spine nicely.  As we dissected up the __________  we could  feel the bottom end of the plate.   We were able to dissect the bottom end of the plate out to remove the two  screws from C6.  There was a scar in the omohyoid stopping [place] from seeing  the old plate  and removing the plate.  We turned the dissection through the  cervical fascia above the omohyoid spinal structures here.  We  carefully  dissected down the anterior cervical spine and the plate was removed,  the  next two screws from C5 to  loosen the plate and remove the plate.   We then placed a new plate in the 6-7 space, took an x-ray and confirmed our  positioning.  The longus colli muscle was reflected laterally __________  using the Bovie from C6 through T1.  Self-retaining retractors were placed  structures in the C6 and T1.  The interspaces of 6-7 and C7, T1 were incised  with a 15 blade.  A partial diskectomy was performed and we distracted the  interspaces gently.   The microscope was brought into the field for the remainder of the  dissection.  Using curets, one and two millimeter Kerrison punches we  continued the diskectomy using the high-speed to remove the cartilaginous  implant.  We removed the posterior disk and the posterior ligament with the  Kerrison punches, and did bilateral foraminotomies at both levels of 6-7 and  C7, T1 levels.  I measured the height of the disk spaces to be  7 mm.  I  dissected 6 mm at C7, T1.  We left small pieces of Gelfoam in the lateral  areas of venous bleeding, which were controlled easily. It was irrigated  with antibiotic solution and tapped the bone plugs into place at both levels  __________  a couple millimeters, which __________  posterior __________  cutting are between the bone grafts and dura.  Distraction pins were  removed. Hemostasis was obtained with Gelfoam and thrombin.   The osteophytes were removed with Leksell rongeur and weights were removed  from the traction.  Bone plugs were firmly in place.  An Eagle anterior  __________  was placed over the anterior cervical spine.  The rest of the  screws were placed in the holes of C6 where the higher plate was.  We then  drilled holes in the C7, T1 endplates  with 14 mm screws firmly attached  plate to the cervical spine and checked the bone plugs there, which were  firmly in place.   Lateral x-rays were obtained showing our plate and screws to look very good  C6, 7.  We could not see T1.   We irrigated with antibiotic solution.  Hemostasis was obtained with  electrocauterization.  The patient was on some aspirin  and there was some  oozing, but no definitive bleeding.  It was felt best to leave a drain and a  JP drain was placed through a stab wound incision, and placed into the  surgical site and connected to bulb suction.  The drain was sutured in with  3-0 nylon sutures.  The platysma was closed with 3-0 Vicryl interrupted  sutures.  The subcutaneous tissue was closed with the same material.  The  skin was closed with benzoin and Steri-strips.  A dressing was placed.   The patient was reversed from anesthesia and transferred to the recovery  room in stable condition.           ______________________________  Clydene Fake, M.D.     JRH/MEDQ  D:  11/01/2005  T:  11/02/2005  Job:  [id number]

## 2010-09-11 NOTE — Op Note (Signed)
[place]. [facility]  Patient:    Dustin Carter, Dustin Carter Visit Number: [id number] MRN: [id number]          Service Type: SUR Location: 3000 3010 [id number] Attending Physician:  Colon Branch Dictated by:   Clydene Fake, M.D. Proc. Date: 06/29/01 Admit Date:  06/29/2001                             Operative Report  PREOPERATIVE DIAGNOSIS:  Degenerative disk disease at L1 through L-4, with herniated nucleus pulposus on the right side at L1-2, recurrent herniated nucleus pulposus left side L3-4, and unstable spondylolisthesis at L2-3.  POSTOPERATIVE DIAGNOSIS:  Degenerative disk disease at L1 through L-4, with herniated nucleus pulposus on the right side at L1-2, recurrent herniated nucleus pulposus left side L3-4, and unstable spondylolisthesis at L2-3.  PROCEDURES: 1. Recurrent diskectomy at L3-4. 2. Lumbar laminectomy and diskectomy at L1-2. 3. T-LIF transforaminal lumbar interbody fusion at L1-2, L2-3, L3-4. 4. Interbody cages at L1-2, L2-3, and L3-4. 5. Segmented pedicle screw fixation, L1 through L4. 6. Posterolateral fusion, L1 through L4. 7. Open reduction and internal fixation of spondylolisthesis. 8. Left iliac crest graft harvest through separate fascial incision. 9. Allograft.  SURGEON:  Clydene Fake, M.D.  ASSISTANT:  Izell Grand Detour. Elesa Hacker, M.D.  ANESTHESIA:  General endotracheal tube anesthesia.  ESTIMATED BLOOD LOSS:  1100 cc.  BLOOD GIVEN:  500 cc of Cell Saver was given, no other blood.  COMPLICATIONS:  None.  DRAINS:  None.  DISPOSITION:  The patient was transferred to the recovery room in stable condition.  REASON FOR PROCEDURE:  Patient has a long history of lumbar problems, has had prior surgery at 2-3 and 3-4.  Continues having worsening of back pain with symptoms of stenosis, some leg pain.  Workup showed degenerative changes L1 through L4 with a right-sided disk herniation at 1-2 causing canal stenosis, unstable spondylolisthesis  at 2-3, and recurrent disk herniation on the left side at L3-4, and the patient brought in for decompression and fusion.  DESCRIPTION OF PROCEDURE:  The patient was brought into the operating room and general anesthesia was induced.  The patient was placed in the prone position on the Wilson frame with all pressure points padded.  The patient was then draped in a sterile fashion and the site of incision, which was in the midline of the lumbar spine at the site of previous incision, was injected with 10 cc of 1% lidocaine with epinephrine.  The incision was then made in the midline of the lumbar spine.  The incision was made at the site of previous scar, but this was extended both cephalad and caudally.  The incision was taken from the bottom of T12 to the top of L5 spinous processes.  Subperiosteal dissection was done over the spinous processes and laminae out to the facets. Fluoroscopic imaging was used to confirm our levels.  Dissection was continued out lateral on each side to expose the L1, L2, L3, and L4 transverse processes bilaterally.  Two self-retaining retractors were then placed.  Again fluoroscopic imaging was used to confirm levels.  Lamina was then removed from bottom of L1 through the top of L4 and all bone was saved for fusion later in the case.  We then approached the right side at L1-2.  At this time a hemilaminectomy was performed with Kerrison punches, high-speed drill.  All bone again was saved for use later in the case.  The disk space was found.  We actually continued the dissection down laterally and removed the right facet there, preparing the space for a T-LIF fusion.  The disk space was incised and diskectomy performed.  There was a free fragment that was removed out from under the dura.  The disk space was then prepared for interbody cage, and the space was distracted with interbody distractors and an 11 mm high titanium cage was then tapped into place through  the T-LIF approach.  This processes was repeated at the L2-3 level on the right side where the facetectomy was performed.  With a standard T-LIF approach to the disk, the disk space was incised.  The disk was removed and the disk space prepared for the interbody cage, and the 9 mm cage was tapped in there.  Note prior to placing the cages, we went off to the left side of our incision and made a separate fascial incision to the left iliac crest, hemostasis with Bovie cauterization.  The left iliac crest was then harvested using osteotomes and Leksell rongeurs and curettes to curette the bone.  This autograft bone was then added to the autograft from the spinous processes and laminae, which was cleaned and chopped up into small pieces.  This bone had Grafton putty added to it, and this was packed into our interbody cages.  The fascia over the iliac crest was closed with 0 Vicryl interrupted suture after bone waxing the bone and placing some Gelfoam in the iliac crest defect.  Next we approached the L3-4 disk.  A semihemilaminectomy was performed and a recurrent diskectomy performed.  A lot of scar tissue was there.  We did find a few pieces of disk, and we decompressed the 3 and 4 roots.  We continued dissection to perform a facetectomy on that left side and through a standard T-LIF approach we continued dissecting the disk out laterally and then preparing the interspace for interbody cage.  An 11 mm interbody cage was then tapped into place after preparing the space and packing it with autograft bone.  Note in all three spaces we placed this autograft bone mixture into the space prior to putting the cage in.  Fluoroscopic imaging was used to confirm good position of all our cages and good reduction of the spondylolisthesis at the 2-3 level with the distraction and the interbody cage.  We irrigated with antibiotic solution, hemostasis in the epidural space with Gelfoam and thrombin at  each space.  We then went back to the L1 level.  Starting at the right side, we found the pedicle entry points using fluoroscopy and landmarks of the  transverse process and facet, used a high-speed drill to decorticate the bone, placed a pedicle probe down the pedicle, tapped the hole, and then placed a 5.5 mm x 40 mm long screw in the L1 pedicle.  We then went down to L2, L3, and L4, placing 6.5 mm diameter pedicle screws into each of the pedicles using fluoroscopic imaging as a guidance.  After that we repeated the process at L1 through L4, one screw at each level, L1, L2, L3, and L4 on the right side.  A 5.5 mm screw was used at L1, 6.5 mm screws used at the other levels.  We then placed a rod over the screws and tightened our connectors to these screws.  We then started to tighten the rod to our connectors starting at L4 to tighten them down and then with compression across our  pedicle screw system with compression over the 3-4 space, tightening the L3 connector to the rod, compressing the level of 2-3, tightening the L2, compression over L1-2, and tightening the rod to the L1 connector.  We repeated this process on the right side after we did it on the left.  Final AP and lateral fluoroscopic images were obtained showing good position of the pedicle screws, rods, interbody cages, and good orientation of the spine.  The wound was irrigated with antibiotic solution.  The transverse processes were then decorticated with a high-speed bur and our bone mixture had allograft bone added to this, and this was then placed into our posterolateral gutters from L1 to L4, performing a posterolateral fusion.  Twenty cubic centimeters of 0.25% Marcaine without epinephrine was then injected in the paraspinous muscles.  The paraspinous muscles and then the fascia were closed with 0 Vicryl interrupted sutures.  We then closed the fascial layer that went off toward the iliac crest, separating that from  our midline incision.  Vicryl 2-0 interrupted sutures and 3-0 sutures were used to close the subcutaneous tissue, and the skin was closed with benzoin and Steri-Strips.  Dressing was placed.  The patient was placed back in a supine position and transferred to the recovery room in stable condition. Dictated by:   Clydene Fake, M.D. Attending Physician:  Colon Branch DD:  06/29/01 TD:  06/30/01 Job: [id number]

## 2010-09-11 NOTE — Discharge Summary (Signed)
[place]. [facility]  Patient:    Dustin Carter, Dustin Carter Visit Number: [id number] MRN: [id number]          Service Type: SUR Location: 3000 3010 01 Attending Physician:  Colon Branch Dictated by:   Clydene Fake, M.D. Admit Date:  06/29/2001 Discharge Date: 07/03/2001                             Discharge Summary  ADMISSION DIAGNOSES: 1. Degenerative disc disease lumbar spine with right herniated nucleus    pulposus at L1-L2 and recurrent herniated nucleus pulposus at L3-L4. 2. Unstable spondylolisthesis at L2-L3.  DISCHARGE DIAGNOSES: 1. Degenerative disc disease lumbar spine with right herniated nucleus    pulposus at L1-L2 and recurrent herniated nucleus pulposus at L3-L4. 2. Unstable spondylolisthesis at L2-L3.  PROCEDURE:  Recurrent diskectomy L3-L4 and diskectomy at L1-L2, transforaminal lumbar interbody fusion at L1-L2, L2-L3 and L3-L4 with interbody cages at all three levels with pedicle screw fixation L1-L4 and posterior lateral fusion L1-L4 with reduction internal fixation spondylolisthesis and left leg iliac crest harvest and Allograft.  REASON FOR ADMISSION:  The patient is a 55 year old gentleman who was has had multiple lumbar surgeries but continues to have worsening back pain and leg pain.  His work-up has included x-rays and magnetic resonance imaging which showed degenerative changes in the upper lumbar spine with abnormal motion between flexion and extension  at L2-L3 level.  Magnetic resonance imaging shows broad based shell disc herniation more on the right side at L1-L2 that does cause canal stenosis and recurrent disc herniation left sided at L3-L4 and the patient was brought in for decompression and fusion of these levels.  HOSPITAL COURSE:  The patient was admitted to day surgery and underwent the above procedures with no complications.  Postoperatively the patient was transferred to the recovery room and then to the floor.  He  had some left sided shoulder pain that lasted about one day which was probably due to the positioning and this resolved.  We followed his hemoglobin and hematocrit levels postoperatively which were 13 and 37 and first day after surgery was 11.5 and 37.2.  He started getting up and ambulating and was working with therapy and working on Marketing executive.  He was up with the brace. Incision has remained clean, dry and intact.  The patient had a mild postoperative ileus but did have a bowel movement on the morning of discharge. He has been taking oral medications well.  He will be discharged home in stable condition.  DISCHARGE MEDICATIONS:  Same as prehospitalization except no nonsteroidal anti-inflammatory drugs plus OxyContin 20 mg q.12h with Vicodin p.r.n. for break through pain.  Flexeril p.r.n. spasms.  DIET: As tolerated.  ACTIVITY: Up with brace on.  May shower if he keeps his incision dry.  FOLLOW UP: Follow up will be in three weeks in my office. Dictated by:   Clydene Fake, M.D. Attending Physician:  Colon Branch DD:  07/03/01 TD:  07/04/01 Job: [id number]

## 2010-09-11 NOTE — Discharge Summary (Signed)
[facility]  Patient:    Dustin Carter, Dustin Carter                        MRN: [id number] Adm. Date:  [id number] Disc. Date: [id number] Attending:  Laqueta Jean CC:         Bernadene Person, M.D.   Discharge Summary  HOSPITAL COURSE:  Patient was admitted on August 10, operated on August 11. The operation was a cystourethroscopy and left double-J catheter.  PRINCIPAL DIAGNOSES: 1. Left ureteral stone. 2. History of back surgery. 3. History of tonsillectomy. 4. Hypertension. 5. Gastroesophageal reflux disease.  HISTORY OF PRESENT ILLNESS:  Mr. Kropf is a 55 year old, single white male, seen in the [facility] emergency room by Dr. Wanda Plump one week prior to admission, with left upper quadrant pain, left lower quadrant pain, gross hematuria, and a CT scan showing a 3 m left upper ureteral stone, and a 2 mm left renal stone.  The patient was seen in the office for evaluation with continued pain but could not be operated upon for a basket extraction of stone without medical clearance.  The patient has awaited cardiac and medical clearance for the week, but called 911 overnight for emergency room evaluation for continued left upper quadrant pain, nausea, and vomiting.  PAST MEDICAL HISTORY: 1. Back surgery. 2. Tonsillectomy. 3. Hyperlipidemia. 4. Gastroesophageal reflux disease.  ALLERGIES:  None known.  MEDICATIONS: 1. Ziac. 2. Zocor. 3. Prevacid. 4. Celebrex.  FAMILY HISTORY: 1. Arteriosclerotic vascular disease. 2. Hypertension. 3. Carcinoma of the prostate. 4. Renal stones. 5. Renal disease.  SOCIAL HISTORY:  Cigarettes:  None.  Alcohol:  None.  ADMISSION PHYSICAL EXAMINATION:  VITAL SIGNS:  Temperature 102, decreased to 99.2, pulse 111, respiratory rate 16, blood pressure 154/98.  Remainder of physical examination is as noted on H&P dictated on December 04, 1999.  LABORATORY DATA:  White blood count 17,000, hemoglobin 12.9,  hematocrit 38.1, platelet count 191,000, with sodium of 136, potassium 4,5, chloride 101, CO2 27, BUN 13, creatinine 1.5.  Liver functions showed alkaline phosphatase of 194 (elevated).  Urinalysis show 6 to 10 white blood cells, 0 to 5 white blood cells.  Urine culture showed no growth.  EKG showed normal EKG.  HOSPITAL COURSE:  The patient was admitted to the urologic surgical service where he was seen in consultation by Dr. Juleen China on August 10, and noted to have a history of hypertension, elevated cholesterol, and GERD.  He was felt to be stable for surgery.  On August 11, the patient was taken to the operating room by Dr. Wanda Plump, and underwent a left double J catheter insertion.  He tolerated the procedure well, and was discharged on August 12 per Dr. Wanda Plump.  A CBC showed the white count had decreased to 8800 by the time of discharge.  The patient is discharged in stable condition. DD:  02/12/00 TD:  02/12/00 Job: [id number]

## 2010-09-11 NOTE — Discharge Summary (Signed)
[place]. [facility]  Patient:    Dustin Carter, Dustin Carter                          MRN: [id number] Adm. Date:  05/24/00 Disc. Date: 05/26/00 Attending:  Clydene Fake, M.D.                           Discharge Summary  ADMISSION DIAGNOSES: 1. Lumbar stenosis at L3-4. 2. Left T9-10 herniated nucleus pulposus with some myelopathy.  DISCHARGE DIAGNOSES: 1. Lumbar stenosis at L3-4. 2. Left T9-10 herniated nucleus pulposus with some myelopathy.  PROCEDURES: 1. A left L3-4 decompressive semihemilaminectomy and diskectomy. 2. A left T9-10 transcostovertebral diskectomy, inner body fusion with    autograft in the same incision, microdissection with microscope.  REASON FOR ADMISSION:  The patient is a 55 year old gentleman who has been having left leg pain, low back pain with numbness and weakness in his legs and also some left mid back pain with pain radiating around his lower rib cage. He was worked up with MRI lumbar spine and myelogram of the lumbar and thoracic spine.  He was found to have severe stenosis at L3-4, especially on the left side but also found to have a large T9-10 disc herniation causing deformation of the spinal cord.  The patient was found to have some mild myelopathic findings and is brought in for surgery for both levels.  HOSPITAL COURSE:  The patient was admitted the day of surgery and underwent the procedures named above at the same anesthetic course without complications.  Postoperatively, the patient was transferred to the recovery room and a chest x-ray was done showing no signs of pneumothorax and he was transferred to the floor. The next day he did complain of some incisional pain, especially the thoracic incision, moved his lower extremities well, 0-1 beats of clonus in the lower extremities which is better than preoperatively. Rapid alternating movements were done well.  Both incisions were looking good. During that day we worked on  increasing his diet and his activities.  He has been up ambulating with brace on and actually doing very well.  On May 26, 2000, he was ambulating and eating well.  Moderate incision pain but controlled with oral pain medications and he was discharged home in stable condition.  DISCHARGE MEDICATIONS: 1. Vicodin ES one to two q.4-6h. p.r.n. 2. Flexeril 10 mg q.8h. p.r.n. 3. All his home medications which include Zocor, Ziac, Prevacid, and will keep    him off Celebrex for a while.  DIET:  As tolerated.  FOLLOW-UP:  He will follow up in my office in 10 to 14 days for staple removal. DD:  06/02/00 TD:  06/03/00 Job: [id number]

## 2010-09-11 NOTE — H&P (Signed)
[facility]  Patient:    Dustin Carter, Dustin Carter                        MRN: [id number] Adm. Date:  [id number] Attending:  Laqueta Jean CC:         Bernadene Person, M.D.   History and Physical  HISTORY OF PRESENT ILLNESS:  Mr. Dyas is a 55 year old single white male, living at home with his parents, who was seen in the [facility] on Sunday of last weekend, with left upper quadrant pain, left lower quadrant pain, gross hematuria, with CT scan showing a 3 mm left upper ureteral calculus and a 2 mm left renal stone.  He was seen in the office for evaluation and with continued pain, but could not be operated upon for basket extraction of stone without medical clearance.  The patient has awaited cardiac and medical clearance this week, but called 911 overnight for emergency room evaluation for continued left upper quadrant pain, nausea, and vomiting.  PAST MEDICAL HISTORY:  Significant for back surgery, tonsillectomy.  Also significant for hypertension, GERD.  ALLERGIES:  None known.  MEDICATIONS:   Ziac (unknown dose), Zocor 40 mg a day; Prevacid 30 mg a day; Celebrex 200 mg a day.  FAMILY HISTORY: 1. ASCD. 2. Hypertension. 3. Carcinoma of the prostate. 4. Renal stones. 5. Renal disease.  SOCIAL HISTORY:  Cigarettes:  None.  Alcohol:  None.  ADMITTING PHYSICAL EXAMINATION:  VITAL SIGNS:  Temperature 102, was decreased to 99.2, pulse 111, respiratory rate 16, blood pressure 154/98.  GENERAL:  A well-developed, well-nourished white male in mild distress (the patient has been treated with Toradol).  HEENT:  PERRL, EOM full.  NECK:  Supple, nontender, no nodes.  CHEST:  Clear to P&A.  ABDOMEN:  Soft, decrease in bowel sounds.  There is left upper quadrant and left CVA pain to percussion.  There is some left lower quadrant pain as well.  EXTREMITIES:  Without cyanosis or edema.  NEUROLOGIC:  Physiologic.  RECTAL:   Deferred this examination.  GENITOURINARY:  Normal male external genitalia.  ADMISSION LABORATORY DATA:  Urinalysis with hematuria.  KUB showing possible 3 mm left lower ureteral stone.  IMPRESSION:  I have discussed the case with Dr. Juleen China.  The patient demands admission to the hospital for treatment of his nausea, vomiting, and pain. Dr. Juleen China will see in consultation for medical clearance, and will plan surgery for Monday if the patient does not pass the stone over the weekend. In view of the patients fever when first seen in the emergency room, he could need surgery over the weekend. DD:  12/04/99 TD:  12/04/99 Job: [id number]

## 2010-09-11 NOTE — H&P (Signed)
[place]. [facility]  Patient:    Dustin Carter, Dustin Carter                        MRN: [id number] Adm. Date:  [id number] Attending:  Colon Branch                         History and Physical  CHIEF COMPLAINT:  Low back and left leg pain with numbness and weakness and left mid back pain with pain radiating around to the lower rib cage.  HISTORY:  Patient is a 55 year old gentleman who had been having the complaints above and was worked up with MRI lumbar spine and myelogram of the lumbar and thoracic spine.  He was found to have severe stenosis at 3/4, especially on the left side, but also found to have a T9/10 left side disk herniation causing some deformation of the spinal cord.  Patient brought in for surgery.  PAST MEDICAL HISTORY:  Significant for hypertension and kidney stones.  PAST SURGICAL HISTORY:  Kidney stone procedure in August 2001, back surgery right 2/3 in 1997, hand surgery in 1990.  CURRENT MEDICATIONS:  Zocor, Ziac, Prevacid, Celebrex.  ALLERGIES:  No known drug allergies.  SOCIAL HISTORY:  He does not smoke or drink alcohol.  He is single.  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  REVIEW OF SYSTEMS:  Otherwise negative.  FAMILY HISTORY:  Noncontributory.  PHYSICAL EXAMINATION:  HEENT:  Unremarkable.  NECK:  Supple.  LUNGS:  Clear.  HEART:  Regular rhythm.  ABDOMEN:  Soft, nontender.  EXTREMITIES:  Intact.  No edema.  NEUROLOGIC:  Rapid alternating movements in the upper extremities are done well, also in the lower extremities.  There is no pronator drift in the upper extremities.  Motor strength intact in the upper extremities, in the lower extremities muscle strength also intact.  Sensation:  He has a decreased in light touch and pin prick in the left L3 and 4 distributions.  Maybe a slight decrease in light touch in the L5/S1 distributions on the left compared to the right side.  Deep tendon reflexes are 3 at the knees and  ankles with some ______ and brisker than they are in the upper extremities.  There is also two to three beats of clonus bilaterally at the ankles, though toes are downgoing.  ASSESSMENT AND PLAN:  Patient with lumbar stenosis with symptoms of that and T9/10 disk herniation with some mild myelopathy and thoracic radicular pain. Patient brought in for decompressive laminectomy of the lumbar spine, T9/10 diskectomy and possible fusion. DD:  05/24/00 TD:  05/24/00 Job: [id number]

## 2010-09-11 NOTE — Op Note (Signed)
NAME:  Carter, Dustin NO.:  [id number]   MEDICAL RECORD NO.:  [id number]          PATIENT TYPE:  OIB   LOCATION:  2899                         FACILITY:  [facility]   PHYSICIAN:  Clydene Fake, M.D.  DATE OF BIRTH:  Aug 23, 1955   DATE OF PROCEDURE:  03/09/2004  DATE OF DISCHARGE:                                 OPERATIVE REPORT   PREOPERATIVE DIAGNOSIS:  HNP, spondylosis of the C-spine with left  radiculopathy.   POSTOPERATIVE DIAGNOSIS:  HNP, spondylosis of the C-spine with left  radiculopathy.   PROCEDURE:  Anterior cervical decompression, diskectomy, and fusion at C5-6  with LifeNet bone and Eagle anterior cervical plate.   SURGEON:  Clydene Fake, M.D.   ASSISTANT:  Stefani Dama, M.D.   ANESTHESIA:  General endotracheal tube anesthesia.   ESTIMATED BLOOD LOSS:  Minimal.   BLOOD REPLACED:  None.   DRAINS:  None.   COMPLICATIONS:  None.   INDICATIONS FOR PROCEDURE:  The patient is a 55 year old gentleman with  head, neck, and left arm pain and numbness.  MRI was done showing severe  spondylitic changes at 5-6 with canal stenosis, biforaminal narrowing, and  flattening of small cord with mild spondylitic changes at other levels.  The  patient is brought in for decompression and fusion at C5-6.   DESCRIPTION OF PROCEDURE:  The patient was brought into the operating room  and general anesthesia was induced.  The patient was placed in 10 pounds of  Halter traction and prepped and draped in the usual sterile fashion.  The  site of incision was injected with 10 mL of 1% lidocaine with epinephrine.  The incision was then made from the midline to the anterior border of the  sternocleidomastoid muscle on the left side.  The neck incision was taken  down to the platysma and hemostasis was obtained with Bovie cauterization.  Blunt dissection was taken through the anterior cervical fascia to the  anterior cervical spine.  Needle was placed in the interspace  and x-rays  were obtained showing we were at the 5-6 interspace.  Disk space was incised  with a 15 blade, the needle was removed and partial diskectomy performed  with pituitary rongeurs.  Longus coli muscles were reflected laterally using  the Bovie and self-retaining retractors were then placed.  Distraction pins  were placed in the C5-6 interspace, distracted, and the diskectomy was  continued with curets, pituitary rongeurs.  1 and 2 mm Kerrison punches were  then used to remove posterior osteophytes, posterior ligament and disk, and  bilateral foraminotomies were performed.  When we were finished, we had good  central decompression and good decompression of the nerve roots and foramen.  The wound was irrigated with antibiotic solution.  Hemostasis was obtained  with Gelfoam and thrombin.  Gelfoam was irrigated out.  We decorticated the  cartilaginous endplates with high speed drill and measured the interspace to  be 6 mm x 6 mm.  The LifeNet bone was then tapped into place, countersunk  about 1 mm or so.  We checked and there was  plenty of room between dura and  bone graft.  The wound was irrigated with antibiotic solution.  Distraction  pins were removed and hemostasis was obtained with Gelfoam and thrombin.  Osteophytes were removed with osteophyte tool and anterior cervical plate  was placed over the anterior cervical spine.  Two screws were placed in the  C5 and two in the C6.  These were tightened down.  Lateral x-rays were  obtained and showed good position of plate and screws at C5-6 and good  position of the bone graft at the lamina spine and decompression of the  osteophytes posteriorly.  Wound was irrigated with antibiotic solution.  We  had good hemostasis.  Retractors were removed and platysma was closed with 3-  0 Vicryl interrupted suture, the subcutaneous tissue was closed with the  same, and skin closed with Benzoin and Steri-Strips.  Dressing was placed.  The patient  was placed in a soft cervical collar and awakened from  anesthesia and transferred to the recovery room in stable condition.       JRH/MEDQ  D:  03/09/2004  T:  03/09/2004  Job:  [id number]

## 2010-12-01 ENCOUNTER — Other Ambulatory Visit: Payer: Self-pay | Admitting: Gastroenterology

## 2010-12-15 ENCOUNTER — Other Ambulatory Visit: Payer: Self-pay | Admitting: Gastroenterology

## 2010-12-15 DIAGNOSIS — D509 Iron deficiency anemia, unspecified: Secondary | ICD-10-CM

## 2010-12-17 ENCOUNTER — Ambulatory Visit
Admission: RE | Admit: 2010-12-17 | Discharge: 2010-12-17 | Disposition: A | Discharge status: Home / Self Care | Payer: BC Managed Care – PPO | Source: Ambulatory Visit | Attending: Gastroenterology | Admitting: Gastroenterology

## 2010-12-17 DIAGNOSIS — D509 Iron deficiency anemia, unspecified: Secondary | ICD-10-CM

## 2011-01-22 LAB — BASIC METABOLIC PANEL
BUN: 8
CO2: 29
Calcium: 9.4
Chloride: 105
Creatinine, Ser: 0.75
GFR calc Af Amer: >60
GFR calc non Af Amer: >60
Glucose, Bld: 105 — ABNORMAL HIGH
Potassium: 3.8
Sodium: 141

## 2011-01-22 LAB — HEMOGLOBIN AND HEMATOCRIT, BLOOD
HCT: 41
Hemoglobin: 13.6

## 2011-01-28 LAB — BASIC METABOLIC PANEL WITH GFR
BUN: 7 mg/dL (ref 6–23)
CO2: 30 meq/L (ref 19–32)
Calcium: 9.1 mg/dL (ref 8.4–10.5)
Chloride: 106 meq/L (ref 96–112)
Creatinine, Ser: 0.73 mg/dL (ref 0.4–1.5)
GFR calc Af Amer: >60 mL/min (ref >60)
GFR calc non Af Amer: >60 mL/min (ref >60)
Glucose, Bld: 97 mg/dL (ref 70–99)
Potassium: 4.5 meq/L (ref 3.5–5.1)
Sodium: 141 meq/L (ref 135–145)

## 2011-01-28 LAB — URINALYSIS, ROUTINE W REFLEX MICROSCOPIC
Bilirubin Urine: NEGATIVE
Glucose, UA: NEGATIVE mg/dL
Hgb urine dipstick: NEGATIVE
Ketones, ur: NEGATIVE mg/dL
Nitrite: NEGATIVE
Protein, ur: NEGATIVE mg/dL
Specific Gravity, Urine: 1.007 (ref 1.005–1.030)
Urobilinogen, UA: 0.2 mg/dL (ref 0.0–1.0)
pH: 6.5 (ref 5.0–8.0)

## 2011-01-28 LAB — CBC
HCT: 39.1 % (ref 39.0–52.0)
Hemoglobin: 13.2 g/dL (ref 13.0–17.0)
MCHC: 33.7 g/dL (ref 30.0–36.0)
MCV: 89.7 fL (ref 78.0–100.0)
Platelets: 142 K/uL — ABNORMAL LOW (ref 150–400)
RBC: 4.36 MIL/uL (ref 4.22–5.81)
RDW: 13.6 % (ref 11.5–15.5)
WBC: 5 K/uL (ref 4.0–10.5)

## 2011-01-28 LAB — APTT: aPTT: 29 s (ref 24–37)

## 2011-01-28 LAB — PROTIME-INR
INR: 1 (ref 0.00–1.49)
Prothrombin Time: 13.6 s (ref 11.6–15.2)

## 2011-03-29 DIAGNOSIS — N400 Enlarged prostate without lower urinary tract symptoms: Secondary | ICD-10-CM

## 2011-03-29 HISTORY — DX: Benign prostatic hyperplasia without lower urinary tract symptoms: N40.0

## 2011-10-22 ENCOUNTER — Other Ambulatory Visit: Payer: Self-pay | Admitting: Endocrinology

## 2011-10-22 DIAGNOSIS — R7989 Other specified abnormal findings of blood chemistry: Secondary | ICD-10-CM

## 2011-10-22 DIAGNOSIS — R945 Abnormal results of liver function studies: Secondary | ICD-10-CM

## 2011-10-25 ENCOUNTER — Encounter (HOSPITAL_COMMUNITY): Payer: Self-pay | Admitting: Pharmacy Technician

## 2011-10-26 ENCOUNTER — Ambulatory Visit
Admission: RE | Admit: 2011-10-26 | Discharge: 2011-10-26 | Disposition: A | Discharge status: Home / Self Care | Payer: Medicare Other | Source: Ambulatory Visit | Attending: Endocrinology | Admitting: Endocrinology

## 2011-10-26 ENCOUNTER — Other Ambulatory Visit: Payer: Self-pay | Admitting: Cardiovascular Disease

## 2011-10-26 DIAGNOSIS — R7989 Other specified abnormal findings of blood chemistry: Secondary | ICD-10-CM

## 2011-10-26 DIAGNOSIS — R945 Abnormal results of liver function studies: Secondary | ICD-10-CM

## 2011-11-02 ENCOUNTER — Encounter (HOSPITAL_COMMUNITY)
Admission: RE | Disposition: A | Discharge status: Home / Self Care | Payer: Self-pay | Source: Ambulatory Visit | Attending: Cardiovascular Disease

## 2011-11-02 ENCOUNTER — Encounter (INDEPENDENT_AMBULATORY_CARE_PROVIDER_SITE_OTHER): Payer: Self-pay

## 2011-11-02 ENCOUNTER — Ambulatory Visit (INDEPENDENT_AMBULATORY_CARE_PROVIDER_SITE_OTHER): Payer: Self-pay | Admitting: General Surgery

## 2011-11-02 ENCOUNTER — Ambulatory Visit (HOSPITAL_COMMUNITY)
Admission: RE | Admit: 2011-11-02 | Discharge: 2011-11-02 | Disposition: A | Discharge status: Home / Self Care | Payer: Medicare Other | Source: Ambulatory Visit | Attending: Cardiovascular Disease | Admitting: Cardiovascular Disease

## 2011-11-02 DIAGNOSIS — Z8249 Family history of ischemic heart disease and other diseases of the circulatory system: Secondary | ICD-10-CM | POA: Insufficient documentation

## 2011-11-02 DIAGNOSIS — I10 Essential (primary) hypertension: Secondary | ICD-10-CM | POA: Insufficient documentation

## 2011-11-02 DIAGNOSIS — E785 Hyperlipidemia, unspecified: Secondary | ICD-10-CM | POA: Insufficient documentation

## 2011-11-02 DIAGNOSIS — Z87891 Personal history of nicotine dependence: Secondary | ICD-10-CM | POA: Insufficient documentation

## 2011-11-02 DIAGNOSIS — R079 Chest pain, unspecified: Secondary | ICD-10-CM | POA: Insufficient documentation

## 2011-11-02 HISTORY — PX: LEFT HEART CATHETERIZATION WITH CORONARY ANGIOGRAM: SHX5451

## 2011-11-02 LAB — CBC
HCT: 36.8 % — ABNORMAL LOW (ref 39.0–52.0)
Hemoglobin: 12 g/dL — ABNORMAL LOW (ref 13.0–17.0)
MCH: 28.4 pg (ref 26.0–34.0)
MCHC: 32.6 g/dL (ref 30.0–36.0)
MCV: 87 fL (ref 78.0–100.0)
Platelets: 149 10*3/uL — ABNORMAL LOW (ref 150–400)
RBC: 4.23 MIL/uL (ref 4.22–5.81)
RDW: 14 % (ref 11.5–15.5)
WBC: 7.9 10*3/uL (ref 4.0–10.5)

## 2011-11-02 SURGERY — LEFT HEART CATHETERIZATION WITH CORONARY ANGIOGRAM
Anesthesia: LOCAL

## 2011-11-02 MED ORDER — SODIUM CHLORIDE 0.9 % IJ SOLN: 3.0000 mL | INTRAMUSCULAR | Status: DC | PRN

## 2011-11-02 MED ORDER — DIAZEPAM 5 MG PO TABS
5.0000 mg | ORAL_TABLET | ORAL | Status: AC
  Administered 2011-11-02: 5 mg via ORAL
  Filled 2011-11-02: qty 1

## 2011-11-02 MED ORDER — SODIUM CHLORIDE 0.9 % IV SOLN
INTRAVENOUS | Status: DC
  Administered 2011-11-02: 13:00:00 via INTRAVENOUS

## 2011-11-02 MED ORDER — ASPIRIN 81 MG PO CHEW: 81.0000 mg | CHEWABLE_TABLET | Freq: Every day | ORAL | Status: DC

## 2011-11-02 MED ORDER — ONDANSETRON HCL 4 MG/2ML IJ SOLN: 4.0000 mg | Freq: Four times a day (QID) | INTRAMUSCULAR | Status: DC | PRN

## 2011-11-02 MED ORDER — SODIUM CHLORIDE 0.9 % IV SOLN: INTRAVENOUS | Status: DC

## 2011-11-02 MED ORDER — ACETAMINOPHEN 325 MG PO TABS
650.0000 mg | ORAL_TABLET | ORAL | Status: DC | PRN
  Administered 2011-11-02: 650 mg via ORAL
  Filled 2011-11-02: qty 2

## 2011-11-02 NOTE — Op Note (Signed)
CLOUD GRAHAM is a 56 y.o. male    [id number] LOCATION:  FACILITY: [facility]  PHYSICIAN: Nanetta Batty, M.D. [birth year]   DATE OF PROCEDURE:  11/02/2011  DATE OF DISCHARGE:  [facility]  CARDIAC CATHETERIZATION     History obtained from chart review. Mr. Blough is a 56 year old single Caucasian male with no children and has positive cardiac risk factors including hypertension and remote tobacco abuse as well as dyslipidemia and strong family history of heart disease. He's had ongoing chest pain despite a negative MET test and Myoview stress test. He presents now for outpatient diagnostic coronary arteriography to define his anatomy and rule out an ischemic etiology.   PROCEDURE DESCRIPTION:    The patient was brought to the second floor  [facility] lab in the postabsorptive state. He was  premedicated with Valium 5 mg by mouth. His right groin was prepped and shaved in usual sterile fashion. Xylocaine 1% was used  for local anesthesia. A 5 French sheath was inserted into the right common femoral  artery using standard Seldinger technique. 5 French left Judkins diagnostic catheters well-defined and pigtail catheter were used for selective coronary in angiography and left ventriculography respectively. Visipaque dye was used for the entirety of the case. Retrograde aortic, left ventricular, and pullback pressures were recorded.   HEMODYNAMICS:    AO SYSTOLIC/AO DIASTOLIC: 148/82   LV SYSTOLIC/LV DIASTOLIC: 148/14  ANGIOGRAPHIC RESULTS:   1. Left main; normal  2. LAD; normal 3. Left circumflex; normal.  4. Right coronary artery; dominant and normal 5. Left ventriculography; RAO left ventriculogram was performed using  25 mL of Visipaque dye at 12 mL/second. The overall LVEF estimated  60 % Without wall motion abnormalities  IMPRESSION:Mr. Dowson has normal coronaries and normal left ventricular function. I believe his chest pain is  noncardiac. The sheath was removed and pressure was held and the groin to achieve hemostasis. The patient left the lab in stable condition. The patient will be gently hydrated and sent home in 4 hours. I will see him back in the office in followup.  Runell Gess MD, [facility] 11/02/2011 3:04 PM

## 2011-12-01 ENCOUNTER — Ambulatory Visit (INDEPENDENT_AMBULATORY_CARE_PROVIDER_SITE_OTHER): Payer: Medicare Other | Admitting: General Surgery

## 2011-12-01 ENCOUNTER — Encounter (INDEPENDENT_AMBULATORY_CARE_PROVIDER_SITE_OTHER): Payer: Self-pay | Admitting: General Surgery

## 2011-12-01 VITALS — BP 140/80 | HR 84 | Temp 97.7°F | Resp 16 | Ht 68.0 in | Wt 190.6 lb

## 2011-12-01 DIAGNOSIS — K8020 Calculus of gallbladder without cholecystitis without obstruction: Secondary | ICD-10-CM

## 2011-12-01 NOTE — Patient Instructions (Signed)
Please read the brochure

## 2011-12-01 NOTE — Progress Notes (Signed)
Patient ID: Dustin Carter, male   DOB: 09/14/1955, 55 y.o.   MRN: [id number]  Chief Complaint  Patient presents with  . Other    eval GB w/stones    HPI Dustin Carter is a 55 y.o. male.   HPI 55-year-old Caucasian male referred by Dr. Kohut for evaluation of cholelithiasis. The patient had routine lab work performed  In mid June which revealed some elevated transaminases. His outline phosphatase was 319, his AST 55, ALT 41 and total bilirubin was normal at 0.3. This prompted an abdominal ultrasound which revealed fatty liver, cholelithiasis, and a normal common bile duct. The patient has been having intermittent epigastric pain on and off for several months. It appears it was initially thought  To be related to chest pain. He saw his cardiologist and underwent a heart catheterization in mid July which revealed normal coronary arteries as well as an ejection fraction of over 60%. He reports that he has occasional epigastric and right upper quadrant pain that lasts for about 15-20 minutes at a time. He describes it as a sharp sensation. It does radiate to his back. It is not associated with nausea or vomiting. It occurs mainly at night. It is not necessarily related to any particular foods. He does take medicine for acid reflux. He states that he didn't get a whole lot of relief when he took a antacid medicine. He denies any trouble swallowing liquids or solids. He denies any weight loss. He denies any jaundice. He states he has a bowel movement about every other day. He denies any melena or hematochezia. He does have a tendency to have loose stools. He denies any prior abdominal surgery. He had his LFTs rechecked in mid July his transaminases including his alkaline phosphatase and bilirubins were all normal. Past Medical History  Diagnosis Date  . Arthritis   . Hyperlipidemia   . Chronic kidney disease     Past Surgical History  Procedure Date  . Back surgery x5 1997-2011  . Neck surgery x4   .  Joint replacement 12/2009    arm & shoulder    Family History  Problem Relation Age of Onset  . Heart disease Father   . Hyperlipidemia Father   . Heart disease Brother   . Hyperlipidemia Brother   . Cancer Maternal Aunt     brain  . Heart disease Maternal Uncle   . Cancer Paternal Uncle     liver  . Heart disease Maternal Grandmother   . Stroke Paternal Grandfather   . Heart disease Brother   . Hyperlipidemia Brother   . Heart disease Maternal Uncle     Social History History  Substance Use Topics  . Smoking status: Former Smoker -- 1.0 packs/day for 12 years    Types: Cigarettes    Quit date: 04/26/1992  . Smokeless tobacco: Former User    Types: Chew    Quit date: 04/26/1980   Comment: used chew for 12 years before started smoking  . Alcohol Use: No    No Known Allergies  Current Outpatient Prescriptions  Medication Sig Dispense Refill  . amitriptyline (ELAVIL) 100 MG tablet Take 100 mg by mouth at bedtime.      . amLODipine (NORVASC) 5 MG tablet Take 5 mg by mouth daily.      . aspirin EC 325 MG tablet Take 325 mg by mouth at bedtime.      . cyclobenzaprine (FLEXERIL) 10 MG tablet Take 10 mg by mouth 3 (three)   times daily as needed. For muscle spasms      . gabapentin (NEURONTIN) 300 MG capsule Take 300 mg by mouth 4 (four) times daily.      . HYDROcodone-acetaminophen (NORCO) 7.5-325 MG per tablet Take 1 tablet by mouth 2 (two) times daily as needed. For pain      . meloxicam (MOBIC) 15 MG tablet Take 15 mg by mouth daily.      . metoprolol succinate (TOPROL-XL) 50 MG 24 hr tablet Take 75 mg by mouth daily. Take with or immediately following a meal.      . niacin-simvastatin (SIMCOR) 1000-20 MG 24 hr tablet Take 1 tablet by mouth at bedtime.      . pantoprazole (PROTONIX) 40 MG tablet Take 40 mg by mouth daily.        Review of Systems Review of Systems  Constitutional: Negative for fever, chills, appetite change and unexpected weight change.  HENT: Positive  for neck pain. Negative for congestion and trouble swallowing.   Eyes: Negative for visual disturbance.  Respiratory: Negative for chest tightness and shortness of breath.   Cardiovascular: Negative for chest pain and leg swelling.       No PND, no orthopnea, no DOE  Gastrointestinal:       See HPI  Genitourinary: Negative for dysuria and hematuria.  Musculoskeletal: Positive for back pain.  Skin: Negative for rash.  Neurological: Negative for seizures and speech difficulty.       No amaurosis fugax  Hematological: Does not bruise/bleed easily.  Psychiatric/Behavioral: Negative for behavioral problems and confusion.    Blood pressure 140/80, pulse 84, temperature 97.7 F (36.5 C), temperature source Temporal, resp. rate 16, height 5' 8" (1.727 m), weight 190 lb 9.6 oz (86.456 kg).  Physical Exam Physical Exam  Vitals reviewed. Constitutional: He is oriented to person, place, and time. He appears well-developed and well-nourished. No distress.  HENT:  Head: Normocephalic and atraumatic.  Right Ear: External ear normal.  Left Ear: External ear normal.       +glasses  Eyes: Conjunctivae are normal. No scleral icterus.  Neck: Normal range of motion. Neck supple. No tracheal deviation present. No thyromegaly present.    Cardiovascular: Normal rate, regular rhythm and normal heart sounds.   No murmur heard. Pulmonary/Chest: Effort normal and breath sounds normal. No respiratory distress. He has no wheezes.  Abdominal: Soft. Bowel sounds are normal. He exhibits no distension. There is no tenderness.  Musculoskeletal: Normal range of motion. He exhibits no edema and no tenderness.  Neurological: He is alert and oriented to person, place, and time. He exhibits normal muscle tone.  Skin: Skin is warm and dry. No rash noted. He is not diaphoretic. No erythema.  Psychiatric: He has a normal mood and affect. His behavior is normal. Thought content normal.    Data Reviewed ABDOMINAL  ULTRASOUND COMPLETE 10/22/11 Comparison: None.  Findings:   Gallbladder: There is an 8 mm gallstone within the gallbladder  lumen. No gallbladder wall thickening or pericholecystic fluid.  Negative sonographic Murphy's sign.   Common Bile Duct: Within normal limits in caliber. Measures 3 mm.   Liver: No focal mass lesion identified. There is diffuse fatty  infiltration.  IVC: Appears normal.  Pancreas: No abnormality identified.  Spleen: Within normal limits in size and echotexture.  Right kidney: Normal in size and parenchymal echogenicity. No  evidence of mass or hydronephrosis.  Left kidney: Normal in size and parenchymal echogenicity. No  evidence of mass or hydronephrosis.  Abdominal   Aorta: No aneurysm identified.   IMPRESSION:  There is diffuse fatty infiltration of the liver.  Cholelithiasis without sonographic evidence of cholecystitis.  Dr Berry's office note 6/28 LHC report 7/9 - normal coronary arteries and EF 60%   Assessment    Symptomatic cholelithiasis Fatty Liver    Plan    I believe the patient's symptoms are consistent with gallbladder disease. We did discuss several differential diagnoses for his transient elevation in his transaminases and his phosphatase in mid June. It could be due to fatty liver disease, medication related, or passing a gallstone. Since his LFTs normalized,my suspicion is that he passed a gallstone.  We discussed gallbladder disease. The patient was given educational material. We discussed non-operative and operative management. We discussed the signs & symptoms of acute cholecystitis  I discussed laparoscopic cholecystectomy with IOC in detail.  The patient was given educational material as well as diagrams detailing the procedure.  We discussed the risks and benefits of a laparoscopic cholecystectomy including, but not limited to bleeding, infection, injury to surrounding structures such as the intestine or liver, bile leak, retained  gallstones, need to convert to an open procedure, prolonged diarrhea, blood clots such as  DVT, common bile duct injury, anesthesia risks, and possible need for additional procedures.  We discussed the typical post-operative recovery course. I explained that the likelihood of improvement of their symptoms is good.  He has elected to proceed to the operating room.   Shaliyah Taite M. Derward Marple, MD, FACS General, Bariatric, & Minimally Invasive Surgery [facility]        Jennah Satchell M 12/01/2011, 11:03 AM    

## 2011-12-02 ENCOUNTER — Encounter (INDEPENDENT_AMBULATORY_CARE_PROVIDER_SITE_OTHER): Payer: Self-pay

## 2011-12-16 ENCOUNTER — Encounter (HOSPITAL_COMMUNITY): Payer: Self-pay | Admitting: Pharmacy Technician

## 2011-12-23 ENCOUNTER — Encounter (HOSPITAL_COMMUNITY): Payer: Self-pay

## 2011-12-23 ENCOUNTER — Encounter (HOSPITAL_COMMUNITY)
Admission: RE | Admit: 2011-12-23 | Discharge: 2011-12-23 | Disposition: A | Discharge status: Home / Self Care | Payer: Medicare Other | Source: Ambulatory Visit | Attending: General Surgery | Admitting: General Surgery

## 2011-12-23 HISTORY — DX: Gastro-esophageal reflux disease without esophagitis: K21.9

## 2011-12-23 HISTORY — DX: Benign prostatic hyperplasia without lower urinary tract symptoms: N40.0

## 2011-12-23 HISTORY — DX: Essential (primary) hypertension: I10

## 2011-12-23 LAB — COMPREHENSIVE METABOLIC PANEL
ALT: 12 U/L (ref 0–53)
AST: 17 U/L (ref 0–37)
Albumin: 3.6 g/dL (ref 3.5–5.2)
Alkaline Phosphatase: 96 U/L (ref 39–117)
BUN: 6 mg/dL (ref 6–23)
CO2: 28 mEq/L (ref 19–32)
Calcium: 9 mg/dL (ref 8.4–10.5)
Chloride: 106 mEq/L (ref 96–112)
Creatinine, Ser: 0.76 mg/dL (ref 0.50–1.35)
GFR calc Af Amer: >90 mL/min (ref >90)
GFR calc non Af Amer: >90 mL/min (ref >90)
Glucose, Bld: 92 mg/dL (ref 70–99)
Potassium: 4.2 mEq/L (ref 3.5–5.1)
Sodium: 140 mEq/L (ref 135–145)
Total Bilirubin: 0.2 mg/dL — ABNORMAL LOW (ref 0.3–1.2)
Total Protein: 6.5 g/dL (ref 6.0–8.3)

## 2011-12-23 LAB — CBC WITH DIFFERENTIAL/PLATELET
Basophils Absolute: 0.1 10*3/uL (ref 0.0–0.1)
Basophils Relative: 1 % (ref 0–1)
Eosinophils Absolute: 0.1 10*3/uL (ref 0.0–0.7)
Eosinophils Relative: 2 % (ref 0–5)
HCT: 37.7 % — ABNORMAL LOW (ref 39.0–52.0)
Hemoglobin: 12.2 g/dL — ABNORMAL LOW (ref 13.0–17.0)
Lymphocytes Relative: 25 % (ref 12–46)
Lymphs Abs: 1.5 10*3/uL (ref 0.7–4.0)
MCH: 27.8 pg (ref 26.0–34.0)
MCHC: 32.4 g/dL (ref 30.0–36.0)
MCV: 85.9 fL (ref 78.0–100.0)
Monocytes Absolute: 0.6 10*3/uL (ref 0.1–1.0)
Monocytes Relative: 9 % (ref 3–12)
Neutro Abs: 3.9 10*3/uL (ref 1.7–7.7)
Neutrophils Relative %: 64 % (ref 43–77)
Platelets: 177 10*3/uL (ref 150–400)
RBC: 4.39 MIL/uL (ref 4.22–5.81)
RDW: 14.3 % (ref 11.5–15.5)
WBC: 6.2 10*3/uL (ref 4.0–10.5)

## 2011-12-23 LAB — SURGICAL PCR SCREEN
MRSA, PCR: NEGATIVE
Staphylococcus aureus: POSITIVE — AB

## 2011-12-23 MED ORDER — CHLORHEXIDINE GLUCONATE 4 % EX LIQD: 1.0000 "application " | Freq: Once | CUTANEOUS | Status: DC

## 2011-12-23 NOTE — Pre-Procedure Instructions (Signed)
PREOP CBC, DIFF, CMET WERE DONE AT [facility] AS PER ORDERS DR. Andrey Campanile. PT HAS EKG REPORT June 2013, NUCLEAR STRESS TEST REPORT 10/01/11, AND CARDIOLOGY OFFICE NOTES ON CHART FROM DR. BERRY. PT HAS CXR REPORT 08/23/11 ON HIS CHART FROM DR. KOHUT. PREOP INSTRUCTIONS DISCUSSED WITH PT USING TEACH BACK METHOD.

## 2011-12-23 NOTE — Patient Instructions (Signed)
YOUR SURGERY IS SCHEDULED ON:  WED  9/4  AT 8:30 AM  REPORT TO [place] [facility]ORT STAY [facility] AT:  6:30 AM      PHONE # FOR SHORT STAY IS [phone]  DO NOT EAT OR DRINK ANYTHING AFTER MIDNIGHT THE NIGHT BEFORE YOUR SURGERY.  YOU MAY BRUSH YOUR TEETH, RINSE OUT YOUR MOUTH--BUT NO WATER, NO FOOD, NO CHEWING GUM, NO MINTS, NO CANDIES, NO CHEWING TOBACCO.  PLEASE TAKE THE FOLLOWING MEDICATIONS THE AM OF YOUR SURGERY WITH A FEW SIPS OF WATER:  AMLODIPINE, GABAPENTIN, METOPROLOL, PANTOPRAZOLE    IF YOU USE INHALERS--USE YOUR INHALERS THE AM OF YOUR SURGERY AND BRING INHALERS TO THE HOSPITAL -TAKE TO SURGERY.    IF YOU ARE DIABETIC:  DO NOT TAKE ANY DIABETIC MEDICATIONS THE AM OF YOUR SURGERY.  IF YOU TAKE INSULIN IN THE EVENINGS--PLEASE ONLY TAKE 1/2 NORMAL EVENING DOSE THE NIGHT BEFORE YOUR SURGERY.  NO INSULIN THE AM OF YOUR SURGERY.  IF YOU HAVE SLEEP APNEA AND USE CPAP OR BIPAP--PLEASE BRING THE MASK --NOT THE MACHINE-NOT THE TUBING   -JUST THE MASK. DO NOT BRING VALUABLES, MONEY, CREDIT CARDS.  CONTACT LENS, DENTURES / PARTIALS, GLASSES SHOULD NOT BE WORN TO SURGERY AND IN MOST CASES-HEARING AIDS WILL NEED TO BE REMOVED.  BRING YOUR GLASSES CASE, ANY EQUIPMENT NEEDED FOR YOUR CONTACT LENS. FOR PATIENTS ADMITTED TO THE HOSPITAL--CHECK OUT TIME THE DAY OF DISCHARGE IS 11:00 AM.  ALL INPATIENT ROOMS ARE PRIVATE - WITH BATHROOM, TELEPHONE, TELEVISION AND WIFI INTERNET. IF YOU ARE BEING DISCHARGED THE SAME DAY OF YOUR SURGERY--YOU CAN NOT DRIVE YOURSELF HOME--AND SHOULD NOT GO HOME ALONE BY TAXI OR BUS.  NO DRIVING OR OPERATING MACHINERY FOR 24 HOURS FOLLOWING ANESTHESIA / PAIN MEDICATIONS.                            SPECIAL INSTRUCTIONS:  CHLORHEXIDINE SOAP SHOWER (other brand names are Betasept and Hibiclens ) PLEASE SHOWER WITH CHLORHEXIDINE THE NIGHT BEFORE YOUR SURGERY AND THE AM OF YOUR SURGERY. DO NOT USE CHLORHEXIDINE ON YOUR FACE OR PRIVATE AREAS--YOU MAY USE YOUR NORMAL SOAP THOSE AREAS  AND YOUR NORMAL SHAMPOO.  WOMEN SHOULD AVOID SHAVING UNDER ARMS AND SHAVING LEGS 48 HOURS BEFORE USING CHLORHEXIDINE TO AVOID SKIN IRRITATION.  DO NOT USE IF ALLERGIC TO CHLORHEXIDINE.  PLEASE READ OVER ANY  FACT SHEETS THAT YOU WERE GIVEN: MRSA INFORMATION

## 2011-12-28 MED ORDER — DEXTROSE 5 % IV SOLN
2.0000 g | INTRAVENOUS | Status: AC
  Administered 2011-12-29: 2 g via INTRAVENOUS
  Filled 2011-12-28: qty 2

## 2011-12-28 NOTE — Anesthesia Preprocedure Evaluation (Addendum)
Anesthesia Evaluation  Patient identified by MRN, date of birth, ID band Patient awake    Reviewed: Allergy & Precautions, H&P , NPO status , Patient's Chart, lab work & pertinent test results, reviewed documented beta blocker date and time   Airway Mallampati: II TM Distance: >3 FB Neck ROM: full    Dental  (+) Edentulous Upper and Edentulous Lower   Pulmonary neg pulmonary ROS,  breath sounds clear to auscultation  Pulmonary exam normal       Cardiovascular hypertension, Pt. on medications Rhythm:regular Rate:Normal     Neuro/Psych Cervical and lumbar fusion negative psych ROS   GI/Hepatic negative GI ROS, Neg liver ROS, GERD-  Medicated and Controlled,  Endo/Other  negative endocrine ROS  Renal/GU negative Renal ROS  negative genitourinary   Musculoskeletal   Abdominal   Peds  Hematology negative hematology ROS (+)   Anesthesia Other Findings   Reproductive/Obstetrics negative OB ROS                          Anesthesia Physical Anesthesia Plan  ASA: II  Anesthesia Plan: General   Post-op Pain Management:    Induction: Intravenous  Airway Management Planned: Oral ETT  Additional Equipment:   Intra-op Plan:   Post-operative Plan: Extubation in OR  Informed Consent: I have reviewed the patients History and Physical, chart, labs and discussed the procedure including the risks, benefits and alternatives for the proposed anesthesia with the patient or authorized representative who has indicated his/her understanding and acceptance.   Dental Advisory Given  Plan Discussed with: CRNA and Surgeon  Anesthesia Plan Comments:         Anesthesia Quick Evaluation

## 2011-12-29 ENCOUNTER — Encounter (HOSPITAL_COMMUNITY): Payer: Self-pay | Admitting: Anesthesiology

## 2011-12-29 ENCOUNTER — Encounter (HOSPITAL_COMMUNITY): Payer: Self-pay | Admitting: *Deleted

## 2011-12-29 ENCOUNTER — Encounter (HOSPITAL_COMMUNITY)
Admission: RE | Disposition: A | Discharge status: Home / Self Care | Payer: Self-pay | Source: Ambulatory Visit | Attending: General Surgery

## 2011-12-29 ENCOUNTER — Ambulatory Visit (HOSPITAL_COMMUNITY): Payer: Medicare Other | Admitting: Anesthesiology

## 2011-12-29 ENCOUNTER — Ambulatory Visit (HOSPITAL_COMMUNITY)
Admission: RE | Admit: 2011-12-29 | Discharge: 2011-12-29 | Disposition: A | Discharge status: Home / Self Care | Payer: Medicare Other | Source: Ambulatory Visit | Attending: General Surgery | Admitting: General Surgery

## 2011-12-29 ENCOUNTER — Ambulatory Visit (HOSPITAL_COMMUNITY): Payer: Medicare Other

## 2011-12-29 DIAGNOSIS — K7689 Other specified diseases of liver: Secondary | ICD-10-CM | POA: Insufficient documentation

## 2011-12-29 DIAGNOSIS — K8010 Calculus of gallbladder with chronic cholecystitis without obstruction: Secondary | ICD-10-CM | POA: Insufficient documentation

## 2011-12-29 DIAGNOSIS — Z96619 Presence of unspecified artificial shoulder joint: Secondary | ICD-10-CM | POA: Insufficient documentation

## 2011-12-29 DIAGNOSIS — Z01812 Encounter for preprocedural laboratory examination: Secondary | ICD-10-CM | POA: Insufficient documentation

## 2011-12-29 DIAGNOSIS — N189 Chronic kidney disease, unspecified: Secondary | ICD-10-CM | POA: Insufficient documentation

## 2011-12-29 DIAGNOSIS — E785 Hyperlipidemia, unspecified: Secondary | ICD-10-CM | POA: Insufficient documentation

## 2011-12-29 DIAGNOSIS — Z79899 Other long term (current) drug therapy: Secondary | ICD-10-CM | POA: Insufficient documentation

## 2011-12-29 HISTORY — PX: CHOLECYSTECTOMY: SHX55

## 2011-12-29 SURGERY — LAPAROSCOPIC CHOLECYSTECTOMY WITH INTRAOPERATIVE CHOLANGIOGRAM
Anesthesia: General | Site: Abdomen | Wound class: Clean Contaminated

## 2011-12-29 MED ORDER — SODIUM CHLORIDE 0.9 % IJ SOLN: 3.0000 mL | Freq: Two times a day (BID) | INTRAMUSCULAR | Status: DC

## 2011-12-29 MED ORDER — MORPHINE SULFATE 10 MG/ML IJ SOLN: 1.0000 mg | INTRAMUSCULAR | Status: DC | PRN

## 2011-12-29 MED ORDER — OXYCODONE HCL 5 MG PO TABS: 5.0000 mg | ORAL_TABLET | ORAL | Status: DC | PRN

## 2011-12-29 MED ORDER — ACETAMINOPHEN 10 MG/ML IV SOLN
INTRAVENOUS | Status: DC | PRN
  Administered 2011-12-29: 1000 mg via INTRAVENOUS

## 2011-12-29 MED ORDER — GLYCOPYRROLATE 0.2 MG/ML IJ SOLN
INTRAMUSCULAR | Status: DC | PRN
  Administered 2011-12-29: 0.6 mg via INTRAVENOUS

## 2011-12-29 MED ORDER — LACTATED RINGERS IV SOLN
INTRAVENOUS | Status: DC | PRN
  Administered 2011-12-29: 08:00:00 via INTRAVENOUS

## 2011-12-29 MED ORDER — 0.9 % SODIUM CHLORIDE (POUR BTL) OPTIME
TOPICAL | Status: DC | PRN
  Administered 2011-12-29: 1000 mL

## 2011-12-29 MED ORDER — IOHEXOL 300 MG/ML  SOLN
INTRAMUSCULAR | Status: AC
  Filled 2011-12-29: qty 1

## 2011-12-29 MED ORDER — HYDROMORPHONE HCL PF 1 MG/ML IJ SOLN
INTRAMUSCULAR | Status: AC
  Filled 2011-12-29: qty 1

## 2011-12-29 MED ORDER — SODIUM CHLORIDE 0.9 % IJ SOLN: 3.0000 mL | INTRAMUSCULAR | Status: DC | PRN

## 2011-12-29 MED ORDER — HYDROMORPHONE HCL PF 1 MG/ML IJ SOLN
0.2500 mg | INTRAMUSCULAR | Status: DC | PRN
  Administered 2011-12-29 (×2): 0.5 mg via INTRAVENOUS

## 2011-12-29 MED ORDER — SUCCINYLCHOLINE CHLORIDE 20 MG/ML IJ SOLN
INTRAMUSCULAR | Status: DC | PRN
  Administered 2011-12-29: 100 mg via INTRAVENOUS

## 2011-12-29 MED ORDER — LACTATED RINGERS IV SOLN
INTRAVENOUS | Status: DC | PRN
  Administered 2011-12-29: 1000 mL

## 2011-12-29 MED ORDER — LACTATED RINGERS IV SOLN: INTRAVENOUS | Status: DC

## 2011-12-29 MED ORDER — BUPIVACAINE-EPINEPHRINE PF 0.25-1:200000 % IJ SOLN
INTRAMUSCULAR | Status: AC
  Filled 2011-12-29: qty 30

## 2011-12-29 MED ORDER — OXYCODONE HCL 5 MG PO TABS
ORAL_TABLET | ORAL | Status: AC
  Administered 2011-12-29: 5 mg
  Filled 2011-12-29: qty 1

## 2011-12-29 MED ORDER — BUPIVACAINE-EPINEPHRINE 0.25% -1:200000 IJ SOLN
INTRAMUSCULAR | Status: DC | PRN
  Administered 2011-12-29: 30 mL

## 2011-12-29 MED ORDER — ACETAMINOPHEN 650 MG RE SUPP
650.0000 mg | RECTAL | Status: DC | PRN
  Filled 2011-12-29: qty 1

## 2011-12-29 MED ORDER — PROMETHAZINE HCL 25 MG/ML IJ SOLN
INTRAMUSCULAR | Status: AC
  Filled 2011-12-29: qty 1

## 2011-12-29 MED ORDER — PROPOFOL 10 MG/ML IV BOLUS
INTRAVENOUS | Status: DC | PRN
  Administered 2011-12-29: 150 mg via INTRAVENOUS

## 2011-12-29 MED ORDER — FENTANYL CITRATE 0.05 MG/ML IJ SOLN
INTRAMUSCULAR | Status: DC | PRN
  Administered 2011-12-29 (×3): 50 ug via INTRAVENOUS
  Administered 2011-12-29: 100 ug via INTRAVENOUS

## 2011-12-29 MED ORDER — ONDANSETRON HCL 4 MG/2ML IJ SOLN
INTRAMUSCULAR | Status: DC | PRN
  Administered 2011-12-29: 4 mg via INTRAVENOUS

## 2011-12-29 MED ORDER — NEOSTIGMINE METHYLSULFATE 1 MG/ML IJ SOLN
INTRAMUSCULAR | Status: DC | PRN
  Administered 2011-12-29: 4 mg via INTRAVENOUS

## 2011-12-29 MED ORDER — ACETAMINOPHEN 325 MG PO TABS: 650.0000 mg | ORAL_TABLET | ORAL | Status: DC | PRN

## 2011-12-29 MED ORDER — PROMETHAZINE HCL 25 MG/ML IJ SOLN
12.5000 mg | INTRAMUSCULAR | Status: DC | PRN
  Administered 2011-12-29: 12.5 mg via INTRAVENOUS

## 2011-12-29 MED ORDER — CEFOXITIN SODIUM-DEXTROSE 1-4 GM-% IV SOLR (PREMIX)
INTRAVENOUS | Status: AC
  Filled 2011-12-29: qty 100

## 2011-12-29 MED ORDER — ONDANSETRON HCL 4 MG/2ML IJ SOLN: 4.0000 mg | Freq: Four times a day (QID) | INTRAMUSCULAR | Status: DC | PRN

## 2011-12-29 MED ORDER — SODIUM CHLORIDE 0.9 % IV SOLN: 250.0000 mL | INTRAVENOUS | Status: DC | PRN

## 2011-12-29 MED ORDER — OXYCODONE-ACETAMINOPHEN 7.5-325 MG PO TABS: 1.0000 | ORAL_TABLET | ORAL | Status: AC | PRN

## 2011-12-29 MED ORDER — ROCURONIUM BROMIDE 100 MG/10ML IV SOLN
INTRAVENOUS | Status: DC | PRN
  Administered 2011-12-29: 30 mg via INTRAVENOUS

## 2011-12-29 MED ORDER — MIDAZOLAM HCL 5 MG/5ML IJ SOLN
INTRAMUSCULAR | Status: DC | PRN
  Administered 2011-12-29: 2 mg via INTRAVENOUS

## 2011-12-29 MED ORDER — DIATRIZOATE MEGLUMINE 30 % UR SOLN
URETHRAL | Status: DC | PRN
  Administered 2011-12-29: 8 mL

## 2011-12-29 MED ORDER — POTASSIUM CHLORIDE IN NACL 20-0.9 MEQ/L-% IV SOLN: INTRAVENOUS | Status: DC

## 2011-12-29 MED ORDER — ACETAMINOPHEN 10 MG/ML IV SOLN
INTRAVENOUS | Status: AC
  Filled 2011-12-29: qty 100

## 2011-12-29 MED ORDER — DEXAMETHASONE SODIUM PHOSPHATE 10 MG/ML IJ SOLN
INTRAMUSCULAR | Status: DC | PRN
  Administered 2011-12-29: 10 mg via INTRAVENOUS

## 2011-12-29 SURGICAL SUPPLY — 44 items
APL SKNCLS STERI-STRIP NONHPOA (GAUZE/BANDAGES/DRESSINGS) ×1
APPLIER CLIP 5 13 M/L LIGAMAX5 (MISCELLANEOUS) ×2
APPLIER CLIP ROT 10 11.4 M/L (STAPLE)
APR CLP MED LRG 11.4X10 (STAPLE)
APR CLP MED LRG 5 ANG JAW (MISCELLANEOUS) ×1
BAG SPEC RTRVL LRG 6X4 10 (ENDOMECHANICALS) ×1
BANDAGE ADHESIVE 1X3 (GAUZE/BANDAGES/DRESSINGS) ×6 IMPLANT
BENZOIN TINCTURE PRP APPL 2/3 (GAUZE/BANDAGES/DRESSINGS) ×2 IMPLANT
CANISTER SUCTION 2500CC (MISCELLANEOUS) ×2 IMPLANT
CHLORAPREP W/TINT 26ML (MISCELLANEOUS) ×2 IMPLANT
CLIP APPLIE 5 13 M/L LIGAMAX5 (MISCELLANEOUS) IMPLANT
CLIP APPLIE ROT 10 11.4 M/L (STAPLE) IMPLANT
CLOTH BEACON ORANGE TIMEOUT ST (SAFETY) ×2 IMPLANT
COVER MAYO STAND STRL (DRAPES) ×1 IMPLANT
COVER SURGICAL LIGHT HANDLE (MISCELLANEOUS) ×2 IMPLANT
DECANTER SPIKE VIAL GLASS SM (MISCELLANEOUS) ×2 IMPLANT
DRAPE C-ARM 42X72 X-RAY (DRAPES) ×1 IMPLANT
DRAPE LAPAROSCOPIC ABDOMINAL (DRAPES) ×2 IMPLANT
DRAPE UTILITY XL STRL (DRAPES) ×2 IMPLANT
DRSG TEGADERM 2-3/8X2-3/4 SM (GAUZE/BANDAGES/DRESSINGS) ×2 IMPLANT
ELECT REM PT RETURN 9FT ADLT (ELECTROSURGICAL) ×2
ELECTRODE REM PT RTRN 9FT ADLT (ELECTROSURGICAL) ×1 IMPLANT
GLOVE BIO SURGEON STRL SZ7.5 (GLOVE) ×2 IMPLANT
GLOVE BIOGEL M STRL SZ7.5 (GLOVE) IMPLANT
GLOVE BIOGEL PI IND STRL 7.0 (GLOVE) ×1 IMPLANT
GLOVE BIOGEL PI INDICATOR 7.0 (GLOVE) ×1
GLOVE INDICATOR 8.0 STRL GRN (GLOVE) ×2 IMPLANT
GOWN STRL NON-REIN LRG LVL3 (GOWN DISPOSABLE) ×2 IMPLANT
GOWN STRL REIN XL XLG (GOWN DISPOSABLE) ×4 IMPLANT
HEMOSTAT SNOW SURGICEL 2X4 (HEMOSTASIS) ×1 IMPLANT
KIT BASIN OR (CUSTOM PROCEDURE TRAY) ×2 IMPLANT
NS IRRIG 1000ML POUR BTL (IV SOLUTION) ×2 IMPLANT
POUCH SPECIMEN RETRIEVAL 10MM (ENDOMECHANICALS) ×2 IMPLANT
SET CHOLANGIOGRAPH MIX (MISCELLANEOUS) IMPLANT
SET IRRIG TUBING LAPAROSCOPIC (IRRIGATION / IRRIGATOR) ×2 IMPLANT
SOLUTION ANTI FOG 6CC (MISCELLANEOUS) ×2 IMPLANT
STRIP CLOSURE SKIN 1/2X4 (GAUZE/BANDAGES/DRESSINGS) ×2 IMPLANT
SUT MNCRL AB 4-0 PS2 18 (SUTURE) ×2 IMPLANT
SUT VICRYL 0 UR6 27IN ABS (SUTURE) ×1 IMPLANT
TOWEL OR 17X26 10 PK STRL BLUE (TOWEL DISPOSABLE) ×2 IMPLANT
TRAY LAP CHOLE (CUSTOM PROCEDURE TRAY) ×2 IMPLANT
TROCAR BLADELESS OPT 5 75 (ENDOMECHANICALS) ×6 IMPLANT
TROCAR XCEL BLUNT TIP 100MML (ENDOMECHANICALS) ×2 IMPLANT
TUBING INSUFFLATION 10FT LAP (TUBING) ×2 IMPLANT

## 2011-12-29 NOTE — Transfer of Care (Signed)
Immediate Anesthesia Transfer of Care Note  Patient: Dustin Carter  Procedure(s) Performed: Procedure(s) (LRB) with comments: LAPAROSCOPIC CHOLECYSTECTOMY WITH INTRAOPERATIVE CHOLANGIOGRAM (N/A)  Patient Location: PACU  Anesthesia Type: General  Level of Consciousness: awake, alert  and patient cooperative  Airway & Oxygen Therapy: Patient Spontanous Breathing and Patient connected to face mask oxygen  Post-op Assessment: Report given to PACU RN and Post -op Vital signs reviewed and stable  Post vital signs: Reviewed and stable  Complications: No apparent anesthesia complications

## 2011-12-29 NOTE — Op Note (Signed)
Laparoscopic Cholecystectomy with IOC Procedure Note  Indications: This patient presents with symptomatic gallbladder disease and will undergo laparoscopic cholecystectomy.  Pre-operative Diagnosis: Calculus of gallbladder without mention of cholecystitis or obstruction  Post-operative Diagnosis: Calculus of gallbladder with other cholecystitis, without mention of obstruction  Surgeon: Atilano Ina   Assistants: Glenna Fellows  Anesthesia: General endotracheal anesthesia  ASA Class: 2  Procedure Details  The patient was seen again in the Holding Room. The risks, benefits, complications, treatment options, and expected outcomes were discussed with the patient. The possibilities of reaction to medication, pulmonary aspiration, perforation of viscus, bleeding, recurrent infection, finding a normal gallbladder, the need for additional procedures, failure to diagnose a condition, the possible need to convert to an open procedure, and creating a complication requiring transfusion or operation were discussed with the patient. The likelihood of improving the patient's symptoms with return to their baseline status is good.  The patient and/or family concurred with the proposed plan, giving informed consent. The site of surgery properly noted. The patient was taken to Operating Room, identified as Vernard Gambles and the procedure verified as Laparoscopic Cholecystectomy with Intraoperative Cholangiogram. A Time Out was held and the above information confirmed.  Prior to the induction of general anesthesia, antibiotic prophylaxis was administered. General endotracheal anesthesia was then administered and tolerated well. After the induction, the abdomen was prepped with Chloraprep and draped in the sterile fashion. The patient was positioned in the supine position.  Local anesthetic agent was injected into the skin near the umbilicus and an incision made. We dissected down to the abdominal fascia with  blunt dissection.  The fascia was incised vertically and we entered the peritoneal cavity bluntly.  A pursestring suture of 0-Vicryl was placed around the fascial opening.  The Hasson cannula was inserted and secured with the stay suture.  Pneumoperitoneum was then created with CO2 and tolerated well without any adverse changes in the patient's vital signs. An 5-mm port was placed in the subxiphoid position.  Two 5-mm ports were placed in the right upper quadrant. All skin incisions were infiltrated with a local anesthetic agent before making the incision and placing the trocars.   We positioned the patient in reverse Trendelenburg, tilted slightly to the patient's left.  The gallbladder was identified, the fundus grasped and retracted cephalad. Adhesions were lysed bluntly and with the electrocautery where indicated, taking care not to injure any adjacent organs or viscus. The infundibulum was grasped and retracted laterally, exposing the peritoneum overlying the triangle of Calot. This was then divided and exposed in a blunt fashion. A critical view of the cystic duct and cystic artery was obtained.  The cystic duct was clearly identified and bluntly dissected circumferentially. The cystic duct was ligated with a clip distally.   An incision was made in the cystic duct and the [facility] cholangiogram catheter introduced. The catheter was secured using a clip. A cholangiogram was then obtained which showed good visualization of the distal and proximal biliary tree with no sign of filling defects or obstruction.  Contrast flowed easily into the duodenum. The catheter was then removed.   The cystic duct was then ligated with clips and divided. The cystic artery was identified, (both a anterior and posterior branch) dissected free, & each ligated with clips and divided as well.   The gallbladder was dissected from the liver bed in retrograde fashion with the electrocautery. The gallbladder was removed and placed in  an Endocatch sac.  The gallbladder and Endocatch sac  were then removed through the umbilical port site. The liver bed was irrigated and inspected. Hemostasis was achieved with the electrocautery. Copious irrigation was utilized and was repeatedly aspirated until clear.  I elected to place a piece of Ethicon SNoW in the gallbladder fossa. The pursestring suture was used to close the umbilical fascia.    We again inspected the right upper quadrant for hemostasis.  The umbilical closure was inspected and there was no air leak and nothing trapped within the closure; however, I felt an additional suture was warranted. An additional interrupted 0-vicryl suture was placed at the umbilical fascia. It was again re-inspected.  Pneumoperitoneum was released as we removed the trocars.  4-0 Monocryl was used to close the skin.   Dermabond was applied. The patient was then extubated and brought to the recovery room in stable condition. Instrument, sponge, and needle counts were correct at closure and at the conclusion of the case.   Findings: Chronic Cholecystitis with Cholelithiasis +SNoW  Estimated Blood Loss: Minimal         Drains: none         Specimens: Gallbladder           Complications: None; patient tolerated the procedure well.         Disposition: PACU - hemodynamically stable.         Condition: stable  Mary Sella. Andrey Campanile, MD, FACS General, Bariatric, & Minimally Invasive Surgery [facility], [place]

## 2011-12-29 NOTE — Preoperative (Signed)
Beta Blockers   Reason not to administer Beta Blockers:Not Applicable 

## 2011-12-29 NOTE — H&P (View-Only) (Signed)
Patient ID: Dustin Carter   DOB: [birth year], 56 y.o.   MRN: [id number]  Chief Complaint  Patient presents with  . Other    eval GB w/stones    HPI Dustin Carter is a 56 y.o. Carter.   HPI 56 year old Caucasian Carter referred by Dr. Juleen China for evaluation of cholelithiasis. The patient had routine lab work performed  In mid June which revealed some elevated transaminases. His outline phosphatase was 319, his AST 55, ALT 41 and total bilirubin was normal at 0.3. This prompted an abdominal ultrasound which revealed fatty liver, cholelithiasis, and a normal common bile duct. The patient has been having intermittent epigastric pain on and off for several months. It appears it was initially thought  To be related to chest pain. He saw his cardiologist and underwent a heart catheterization in mid July which revealed normal coronary arteries as well as an ejection fraction of over 60%. He reports that he has occasional epigastric and right upper quadrant pain that lasts for about 15-20 minutes at a time. He describes it as a sharp sensation. It does radiate to his back. It is not associated with nausea or vomiting. It occurs mainly at night. It is not necessarily related to any particular foods. He does take medicine for acid reflux. He states that he didn't get a whole lot of relief when he took a antacid medicine. He denies any trouble swallowing liquids or solids. He denies any weight loss. He denies any jaundice. He states he has a bowel movement about every other day. He denies any melena or hematochezia. He does have a tendency to have loose stools. He denies any prior abdominal surgery. He had his LFTs rechecked in mid July his transaminases including his alkaline phosphatase and bilirubins were all normal. Past Medical History  Diagnosis Date  . Arthritis   . Hyperlipidemia   . Chronic kidney disease     Past Surgical History  Procedure Date  . Back surgery x5 1997-2011  . Neck surgery x4   .  Joint replacement 12/2009    arm & shoulder    Family History  Problem Relation Age of Onset  . Heart disease Father   . Hyperlipidemia Father   . Heart disease Brother   . Hyperlipidemia Brother   . Cancer Maternal Aunt     brain  . Heart disease Maternal Uncle   . Cancer Paternal Uncle     liver  . Heart disease Maternal Grandmother   . Stroke Paternal Grandfather   . Heart disease Brother   . Hyperlipidemia Brother   . Heart disease Maternal Uncle     Social History History  Substance Use Topics  . Smoking status: Former Smoker -- 1.0 packs/day for 12 years    Types: Cigarettes    Quit date: 04/26/1992  . Smokeless tobacco: Former Neurosurgeon    Types: Chew    Quit date: 04/26/1980   Comment: used chew for 12 years before started smoking  . Alcohol Use: No    No Known Allergies  Current Outpatient Prescriptions  Medication Sig Dispense Refill  . amitriptyline (ELAVIL) 100 MG tablet Take 100 mg by mouth at bedtime.      Marland Kitchen amLODipine (NORVASC) 5 MG tablet Take 5 mg by mouth daily.      Marland Kitchen aspirin EC 325 MG tablet Take 325 mg by mouth at bedtime.      . cyclobenzaprine (FLEXERIL) 10 MG tablet Take 10 mg by mouth 3 (three)  times daily as needed. For muscle spasms      . gabapentin (NEURONTIN) 300 MG capsule Take 300 mg by mouth 4 (four) times daily.      Marland Kitchen HYDROcodone-acetaminophen (NORCO) 7.5-325 MG per tablet Take 1 tablet by mouth 2 (two) times daily as needed. For pain      . meloxicam (MOBIC) 15 MG tablet Take 15 mg by mouth daily.      . metoprolol succinate (TOPROL-XL) 50 MG 24 hr tablet Take 75 mg by mouth daily. Take with or immediately following a meal.      . niacin-simvastatin (SIMCOR) 1000-20 MG 24 hr tablet Take 1 tablet by mouth at bedtime.      . pantoprazole (PROTONIX) 40 MG tablet Take 40 mg by mouth daily.        Review of Systems Review of Systems  Constitutional: Negative for fever, chills, appetite change and unexpected weight change.  HENT: Positive  for neck pain. Negative for congestion and trouble swallowing.   Eyes: Negative for visual disturbance.  Respiratory: Negative for chest tightness and shortness of breath.   Cardiovascular: Negative for chest pain and leg swelling.       No PND, no orthopnea, no DOE  Gastrointestinal:       See HPI  Genitourinary: Negative for dysuria and hematuria.  Musculoskeletal: Positive for back pain.  Skin: Negative for rash.  Neurological: Negative for seizures and speech difficulty.       No amaurosis fugax  Hematological: Does not bruise/bleed easily.  Psychiatric/Behavioral: Negative for behavioral problems and confusion.    Blood pressure 140/80, pulse 84, temperature 97.7 F (36.5 C), temperature source Temporal, resp. rate 16, height 5\' 8"  (1.727 m), weight 190 lb 9.6 oz (86.456 kg).  Physical Exam Physical Exam  Vitals reviewed. Constitutional: He is oriented to person, place, and time. He appears well-developed and well-nourished. No distress.  HENT:  Head: Normocephalic and atraumatic.  Right Ear: External ear normal.  Left Ear: External ear normal.       +glasses  Eyes: Conjunctivae are normal. No scleral icterus.  Neck: Normal range of motion. Neck supple. No tracheal deviation present. No thyromegaly present.    Cardiovascular: Normal rate, regular rhythm and normal heart sounds.   No murmur heard. Pulmonary/Chest: Effort normal and breath sounds normal. No respiratory distress. He has no wheezes.  Abdominal: Soft. Bowel sounds are normal. He exhibits no distension. There is no tenderness.  Musculoskeletal: Normal range of motion. He exhibits no edema and no tenderness.  Neurological: He is alert and oriented to person, place, and time. He exhibits normal muscle tone.  Skin: Skin is warm and dry. No rash noted. He is not diaphoretic. No erythema.  Psychiatric: He has a normal mood and affect. His behavior is normal. Thought content normal.    Data Reviewed ABDOMINAL  ULTRASOUND COMPLETE 10/22/11 Comparison: None.  Findings:   Gallbladder: There is an 8 mm gallstone within the gallbladder  lumen. No gallbladder wall thickening or pericholecystic fluid.  Negative sonographic Murphy's sign.   Common Bile Duct: Within normal limits in caliber. Measures 3 mm.   Liver: No focal mass lesion identified. There is diffuse fatty  infiltration.  IVC: Appears normal.  Pancreas: No abnormality identified.  Spleen: Within normal limits in size and echotexture.  Right kidney: Normal in size and parenchymal echogenicity. No  evidence of mass or hydronephrosis.  Left kidney: Normal in size and parenchymal echogenicity. No  evidence of mass or hydronephrosis.  Abdominal  Aorta: No aneurysm identified.   IMPRESSION:  There is diffuse fatty infiltration of the liver.  Cholelithiasis without sonographic evidence of cholecystitis.  Dr Hazle Coca office note 6/28 LHC report 7/9 - normal coronary arteries and EF 60%   Assessment    Symptomatic cholelithiasis Fatty Liver    Plan    I believe the patient's symptoms are consistent with gallbladder disease. We did discuss several differential diagnoses for his transient elevation in his transaminases and his phosphatase in mid June. It could be due to fatty liver disease, medication related, or passing a gallstone. Since his LFTs normalized,my suspicion is that he passed a gallstone.  We discussed gallbladder disease. The patient was given Agricultural engineer. We discussed non-operative and operative management. We discussed the signs & symptoms of acute cholecystitis  I discussed laparoscopic cholecystectomy with IOC in detail.  The patient was given educational material as well as diagrams detailing the procedure.  We discussed the risks and benefits of a laparoscopic cholecystectomy including, but not limited to bleeding, infection, injury to surrounding structures such as the intestine or liver, bile leak, retained  gallstones, need to convert to an open procedure, prolonged diarrhea, blood clots such as  DVT, common bile duct injury, anesthesia risks, and possible need for additional procedures.  We discussed the typical post-operative recovery course. I explained that the likelihood of improvement of their symptoms is good.  He has elected to proceed to the operating room.   Mary Sella. Andrey Campanile, MD, FACS General, Bariatric, & Minimally Invasive Surgery [facility], [place]        [facility] M 12/01/2011, 11:03 AM

## 2011-12-29 NOTE — Anesthesia Postprocedure Evaluation (Signed)
  Anesthesia Post-op Note  Patient: Dustin Carter  Procedure(s) Performed: Procedure(s) (LRB): LAPAROSCOPIC CHOLECYSTECTOMY WITH INTRAOPERATIVE CHOLANGIOGRAM (N/A)  Patient Location: PACU  Anesthesia Type: General  Level of Consciousness: awake and alert   Airway and Oxygen Therapy: Patient Spontanous Breathing  Post-op Pain: mild  Post-op Assessment: Post-op Vital signs reviewed, Patient's Cardiovascular Status Stable, Respiratory Function Stable, Patent Airway and No signs of Nausea or vomiting  Post-op Vital Signs: stable  Complications: No apparent anesthesia complications

## 2011-12-29 NOTE — Interval H&P Note (Signed)
History and Physical Interval Note:  12/29/2011 8:18 AM  Dustin Carter  has presented today for surgery, with the diagnosis of symptomatic cholelithiasis  The various methods of treatment have been discussed with the patient and family. After consideration of risks, benefits and other options for treatment, the patient has consented to  Procedure(s) (LRB) with comments: LAPAROSCOPIC CHOLECYSTECTOMY WITH INTRAOPERATIVE CHOLANGIOGRAM (N/A) as a surgical intervention .  The patient's history has been reviewed, patient examined, no change in status, stable for surgery.  I have reviewed the patient's chart and labs.  Questions were answered to the patient's satisfaction.    Mary Sella. Andrey Campanile, MD, FACS General, Bariatric, & Minimally Invasive Surgery [facility] Surgery, [place]   [facility] M

## 2011-12-30 ENCOUNTER — Encounter (HOSPITAL_COMMUNITY): Payer: Self-pay | Admitting: General Surgery

## 2012-01-21 ENCOUNTER — Encounter (INDEPENDENT_AMBULATORY_CARE_PROVIDER_SITE_OTHER): Payer: Self-pay | Admitting: General Surgery

## 2012-01-21 ENCOUNTER — Ambulatory Visit (INDEPENDENT_AMBULATORY_CARE_PROVIDER_SITE_OTHER): Payer: Medicare Other | Admitting: General Surgery

## 2012-01-21 VITALS — BP 121/83 | HR 72 | Temp 98.0°F | Resp 14 | Ht 68.0 in | Wt 187.0 lb

## 2012-01-21 DIAGNOSIS — Z09 Encounter for follow-up examination after completed treatment for conditions other than malignant neoplasm: Secondary | ICD-10-CM

## 2012-01-21 MED ORDER — OXYCODONE-ACETAMINOPHEN 5-325 MG PO TABS: 1.0000 | ORAL_TABLET | Freq: Four times a day (QID) | ORAL | Status: DC | PRN

## 2012-01-21 NOTE — Progress Notes (Signed)
Subjective:     Patient ID: Dustin Carter, male   DOB: 07/24/1955, 56 y.o.   MRN: [id number]  HPI 56 year old Caucasian male comes in for his postop visit after allowing a laparoscopic cholecystectomy with cholangiogram on September 4. He states that he did well after surgery. He denies any current fever, chills, nausea, vomiting, abdominal pain, diarrhea or constipation. He reports a good appetite. He states the symptoms that he was having prior to surgery have resolved. He states that he feels that he slipped a disc in his back and it bothers him particularly at night. He is unable to get an appointment with his back surgeon until 2 weeks from now. He states that the Percocet he was given for his abdominal surgery has helped somewhat with his back pain and is inquiring if I would give him a refill. He denies any numbness or tingling in his legs  Review of Systems     Objective:   Physical Exam BP 121/83  Pulse 72  Temp 98 F (36.7 C) (Temporal)  Resp 14  Ht 5\' 8"  (1.727 m)  Wt 187 lb (84.823 kg)  BMI 28.43 kg/m2  Gen: alert, NAD, non-toxic appearing Abd: soft, nontender, nondistended. Well-healed trocar sites. No cellulitis. No incisional hernia Ext: no edema, no calf tenderness Skin: no rash, no jaundice     Assessment:     Status post laparoscopic cholecystectomy for chronic cholecystitis and cholelithiasis    Plan:     We reviewed his pathology report which showed chronic cholecystitis and cholelithiasis. Overall he is doing quite well with respect to his abdomen. I have released him to full activities. We did discuss the importance of a cardiac diet. With respect to his back pain I encouraged him to follow with his back surgeon. I did give him a refill on Percocet for 30 tablets. I told him I would not give him any additional refills after this. Followup as needed  Mary Sella. Andrey Campanile, MD, FACS General, Bariatric, & Minimally Invasive Surgery [facility], [place]

## 2012-01-21 NOTE — Patient Instructions (Signed)
Can resume full activities 

## 2012-02-01 ENCOUNTER — Other Ambulatory Visit: Payer: Self-pay | Admitting: Anesthesiology

## 2012-02-01 DIAGNOSIS — M549 Dorsalgia, unspecified: Secondary | ICD-10-CM

## 2012-02-04 ENCOUNTER — Ambulatory Visit
Admission: RE | Admit: 2012-02-04 | Discharge: 2012-02-04 | Disposition: A | Discharge status: Home / Self Care | Payer: Medicare Other | Source: Ambulatory Visit | Attending: Anesthesiology | Admitting: Anesthesiology

## 2012-02-04 VITALS — BP 153/93 | HR 82

## 2012-02-04 DIAGNOSIS — M549 Dorsalgia, unspecified: Secondary | ICD-10-CM

## 2012-02-04 MED ORDER — DIAZEPAM 5 MG PO TABS
10.0000 mg | ORAL_TABLET | Freq: Once | ORAL | Status: AC
  Administered 2012-02-04: 10 mg via ORAL

## 2012-02-04 MED ORDER — ONDANSETRON HCL 4 MG/2ML IJ SOLN: 4.0000 mg | Freq: Once | INTRAMUSCULAR | Status: DC

## 2012-02-04 MED ORDER — IOHEXOL 180 MG/ML  SOLN
20.0000 mL | Freq: Once | INTRAMUSCULAR | Status: AC | PRN
  Administered 2012-02-04: 20 mL via INTRATHECAL

## 2012-02-04 MED ORDER — MEPERIDINE HCL 100 MG/ML IJ SOLN: 75.0000 mg | Freq: Once | INTRAMUSCULAR | Status: DC

## 2012-02-04 NOTE — Progress Notes (Signed)
Patient states he has been off of Amitriptyline for the past two days.  Larina Earthly, RN

## 2012-02-17 ENCOUNTER — Other Ambulatory Visit: Payer: Self-pay | Admitting: Neurosurgery

## 2012-02-17 DIAGNOSIS — IMO0002 Reserved for concepts with insufficient information to code with codable children: Secondary | ICD-10-CM

## 2012-02-17 DIAGNOSIS — M545 Low back pain, unspecified: Secondary | ICD-10-CM

## 2012-02-17 DIAGNOSIS — M961 Postlaminectomy syndrome, not elsewhere classified: Secondary | ICD-10-CM

## 2012-02-23 ENCOUNTER — Ambulatory Visit
Admission: RE | Admit: 2012-02-23 | Discharge: 2012-02-23 | Disposition: A | Discharge status: Home / Self Care | Payer: Medicare Other | Source: Ambulatory Visit | Attending: Neurosurgery | Admitting: Neurosurgery

## 2012-02-23 DIAGNOSIS — IMO0002 Reserved for concepts with insufficient information to code with codable children: Secondary | ICD-10-CM

## 2012-02-23 DIAGNOSIS — M961 Postlaminectomy syndrome, not elsewhere classified: Secondary | ICD-10-CM

## 2012-02-23 DIAGNOSIS — M545 Low back pain, unspecified: Secondary | ICD-10-CM

## 2012-02-23 MED ORDER — GADOBENATE DIMEGLUMINE 529 MG/ML IV SOLN
15.0000 mL | Freq: Once | INTRAVENOUS | Status: AC | PRN
  Administered 2012-02-23: 15 mL via INTRAVENOUS

## 2012-02-25 ENCOUNTER — Encounter (HOSPITAL_COMMUNITY): Payer: Self-pay | Admitting: Pharmacy Technician

## 2012-02-25 ENCOUNTER — Other Ambulatory Visit: Payer: Self-pay | Admitting: Neurosurgery

## 2012-02-28 ENCOUNTER — Encounter (HOSPITAL_COMMUNITY): Payer: Self-pay

## 2012-02-28 MED ORDER — CEFAZOLIN SODIUM-DEXTROSE 2-3 GM-% IV SOLR
2.0000 g | INTRAVENOUS | Status: AC
  Administered 2012-02-29: 2 g via INTRAVENOUS
  Filled 2012-02-28: qty 50

## 2012-02-28 NOTE — Progress Notes (Signed)
Contacted [facility], spoke Aram Beecham, requested last office visit notes, EKG, stress test and echo,.

## 2012-02-29 ENCOUNTER — Encounter (HOSPITAL_COMMUNITY)
Admission: RE | Disposition: A | Discharge status: Home / Self Care | Payer: Self-pay | Source: Ambulatory Visit | Attending: Neurosurgery

## 2012-02-29 ENCOUNTER — Inpatient Hospital Stay (HOSPITAL_COMMUNITY)
Admission: RE | Admit: 2012-02-29 | Discharge: 2012-03-01 | DRG: 491 | Disposition: A | Discharge status: Home / Self Care | Payer: Medicare Other | Source: Ambulatory Visit | Attending: Neurosurgery | Admitting: Neurosurgery

## 2012-02-29 ENCOUNTER — Inpatient Hospital Stay (HOSPITAL_COMMUNITY): Payer: Medicare Other

## 2012-02-29 ENCOUNTER — Encounter (HOSPITAL_COMMUNITY): Payer: Self-pay | Admitting: Anesthesiology

## 2012-02-29 ENCOUNTER — Inpatient Hospital Stay (HOSPITAL_COMMUNITY): Payer: Medicare Other | Admitting: Anesthesiology

## 2012-02-29 ENCOUNTER — Encounter (HOSPITAL_COMMUNITY): Payer: Self-pay | Admitting: Surgery

## 2012-02-29 DIAGNOSIS — M5126 Other intervertebral disc displacement, lumbar region: Principal | ICD-10-CM | POA: Diagnosis present

## 2012-02-29 DIAGNOSIS — Z79899 Other long term (current) drug therapy: Secondary | ICD-10-CM

## 2012-02-29 HISTORY — PX: LUMBAR LAMINECTOMY/DECOMPRESSION MICRODISCECTOMY: SHX5026

## 2012-02-29 LAB — CBC WITH DIFFERENTIAL/PLATELET
Basophils Absolute: 0 10*3/uL (ref 0.0–0.1)
Basophils Relative: 1 % (ref 0–1)
Eosinophils Absolute: 0.1 10*3/uL (ref 0.0–0.7)
Eosinophils Relative: 1 % (ref 0–5)
HCT: 41.2 % (ref 39.0–52.0)
Hemoglobin: 13.3 g/dL (ref 13.0–17.0)
Lymphocytes Relative: 19 % (ref 12–46)
Lymphs Abs: 1.6 10*3/uL (ref 0.7–4.0)
MCH: 28.2 pg (ref 26.0–34.0)
MCHC: 32.3 g/dL (ref 30.0–36.0)
MCV: 87.3 fL (ref 78.0–100.0)
Monocytes Absolute: 0.6 10*3/uL (ref 0.1–1.0)
Monocytes Relative: 6 % (ref 3–12)
Neutro Abs: 6.5 10*3/uL (ref 1.7–7.7)
Neutrophils Relative %: 74 % (ref 43–77)
Platelets: 206 10*3/uL (ref 150–400)
RBC: 4.72 MIL/uL (ref 4.22–5.81)
RDW: 13.6 % (ref 11.5–15.5)
WBC: 8.8 10*3/uL (ref 4.0–10.5)

## 2012-02-29 LAB — BASIC METABOLIC PANEL
BUN: 13 mg/dL (ref 6–23)
CO2: 28 mEq/L (ref 19–32)
Calcium: 9.7 mg/dL (ref 8.4–10.5)
Chloride: 100 mEq/L (ref 96–112)
Creatinine, Ser: 0.83 mg/dL (ref 0.50–1.35)
GFR calc Af Amer: >90 mL/min (ref >90)
GFR calc non Af Amer: >90 mL/min (ref >90)
Glucose, Bld: 96 mg/dL (ref 70–99)
Potassium: 4.7 mEq/L (ref 3.5–5.1)
Sodium: 137 mEq/L (ref 135–145)

## 2012-02-29 LAB — SURGICAL PCR SCREEN
MRSA, PCR: NEGATIVE
Staphylococcus aureus: POSITIVE — AB

## 2012-02-29 LAB — PROTIME-INR
INR: 1.09 (ref 0.00–1.49)
Prothrombin Time: 14 seconds (ref 11.6–15.2)

## 2012-02-29 LAB — APTT: aPTT: 30 seconds (ref 24–37)

## 2012-02-29 SURGERY — LUMBAR LAMINECTOMY/DECOMPRESSION MICRODISCECTOMY 1 LEVEL
Anesthesia: General | Site: Spine Lumbar | Laterality: Right | Wound class: Clean

## 2012-02-29 MED ORDER — MUPIROCIN 2 % EX OINT
TOPICAL_OINTMENT | CUTANEOUS | Status: AC
  Administered 2012-02-29: 1 via NASAL
  Filled 2012-02-29: qty 22

## 2012-02-29 MED ORDER — CEFAZOLIN SODIUM 1-5 GM-% IV SOLN
1.0000 g | Freq: Three times a day (TID) | INTRAVENOUS | Status: DC
  Administered 2012-02-29 – 2012-03-01 (×2): 1 g via INTRAVENOUS
  Filled 2012-02-29 (×3): qty 50

## 2012-02-29 MED ORDER — LIDOCAINE HCL 4 % MT SOLN
OROMUCOSAL | Status: DC | PRN
  Administered 2012-02-29: 4 mL via TOPICAL

## 2012-02-29 MED ORDER — HYDROCODONE-ACETAMINOPHEN 5-325 MG PO TABS
1.0000 | ORAL_TABLET | ORAL | Status: DC | PRN
  Administered 2012-03-01: 2 via ORAL
  Filled 2012-02-29: qty 2

## 2012-02-29 MED ORDER — ROCURONIUM BROMIDE 100 MG/10ML IV SOLN
INTRAVENOUS | Status: DC | PRN
  Administered 2012-02-29: 50 mg via INTRAVENOUS

## 2012-02-29 MED ORDER — ONDANSETRON HCL 4 MG/2ML IJ SOLN: 4.0000 mg | INTRAMUSCULAR | Status: DC | PRN

## 2012-02-29 MED ORDER — GLYCOPYRROLATE 0.2 MG/ML IJ SOLN
INTRAMUSCULAR | Status: DC | PRN
  Administered 2012-02-29: .8 mg via INTRAVENOUS

## 2012-02-29 MED ORDER — DOCUSATE SODIUM 100 MG PO CAPS
100.0000 mg | ORAL_CAPSULE | Freq: Two times a day (BID) | ORAL | Status: DC
  Administered 2012-02-29: 100 mg via ORAL
  Filled 2012-02-29: qty 1

## 2012-02-29 MED ORDER — ACETAMINOPHEN 10 MG/ML IV SOLN
INTRAVENOUS | Status: AC
  Filled 2012-02-29: qty 100

## 2012-02-29 MED ORDER — SODIUM CHLORIDE 0.9 % IR SOLN
Status: DC | PRN
  Administered 2012-02-29: 1000 mL

## 2012-02-29 MED ORDER — SODIUM CHLORIDE 0.9 % IR SOLN
Status: DC | PRN
  Administered 2012-02-29: 15:00:00

## 2012-02-29 MED ORDER — HEMOSTATIC AGENTS (NO CHARGE) OPTIME
TOPICAL | Status: DC | PRN
  Administered 2012-02-29: 1 via TOPICAL

## 2012-02-29 MED ORDER — PROMETHAZINE HCL 12.5 MG PO TABS
12.5000 mg | ORAL_TABLET | ORAL | Status: DC | PRN
  Filled 2012-02-29: qty 2

## 2012-02-29 MED ORDER — HYDROMORPHONE HCL PF 1 MG/ML IJ SOLN
0.2500 mg | INTRAMUSCULAR | Status: DC | PRN
  Administered 2012-02-29 (×2): 0.5 mg via INTRAVENOUS

## 2012-02-29 MED ORDER — LIDOCAINE-EPINEPHRINE 1 %-1:100000 IJ SOLN
INTRAMUSCULAR | Status: DC | PRN
  Administered 2012-02-29: 19 mL

## 2012-02-29 MED ORDER — METHOCARBAMOL 100 MG/ML IJ SOLN
500.0000 mg | INTRAVENOUS | Status: AC
  Administered 2012-02-29: 500 mg via INTRAVENOUS
  Filled 2012-02-29: qty 5

## 2012-02-29 MED ORDER — OXYCODONE-ACETAMINOPHEN 5-325 MG PO TABS: 1.0000 | ORAL_TABLET | ORAL | Status: DC | PRN

## 2012-02-29 MED ORDER — ONDANSETRON HCL 4 MG/2ML IJ SOLN: 4.0000 mg | Freq: Once | INTRAMUSCULAR | Status: DC | PRN

## 2012-02-29 MED ORDER — GABAPENTIN 300 MG PO CAPS
300.0000 mg | ORAL_CAPSULE | Freq: Four times a day (QID) | ORAL | Status: DC
  Administered 2012-02-29: 300 mg via ORAL
  Filled 2012-02-29 (×5): qty 1

## 2012-02-29 MED ORDER — ATORVASTATIN CALCIUM 40 MG PO TABS
40.0000 mg | ORAL_TABLET | Freq: Every day | ORAL | Status: DC
  Administered 2012-02-29: 40 mg via ORAL
  Filled 2012-02-29 (×2): qty 1

## 2012-02-29 MED ORDER — HYDROMORPHONE HCL PF 1 MG/ML IJ SOLN
INTRAMUSCULAR | Status: AC
  Filled 2012-02-29: qty 1

## 2012-02-29 MED ORDER — ACETAMINOPHEN 650 MG RE SUPP: 650.0000 mg | RECTAL | Status: DC | PRN

## 2012-02-29 MED ORDER — KETOROLAC TROMETHAMINE 30 MG/ML IJ SOLN
30.0000 mg | Freq: Four times a day (QID) | INTRAMUSCULAR | Status: DC
  Administered 2012-03-01 (×2): 30 mg via INTRAVENOUS
  Filled 2012-02-29 (×5): qty 1

## 2012-02-29 MED ORDER — ACETAMINOPHEN 10 MG/ML IV SOLN
1000.0000 mg | Freq: Once | INTRAVENOUS | Status: AC | PRN
  Administered 2012-02-29: 1000 mg via INTRAVENOUS

## 2012-02-29 MED ORDER — PROMETHAZINE HCL 25 MG/ML IJ SOLN: 12.5000 mg | INTRAMUSCULAR | Status: DC | PRN

## 2012-02-29 MED ORDER — AMLODIPINE BESYLATE 5 MG PO TABS
5.0000 mg | ORAL_TABLET | Freq: Every morning | ORAL | Status: DC
  Filled 2012-02-29: qty 1

## 2012-02-29 MED ORDER — ACETAMINOPHEN 325 MG PO TABS: 650.0000 mg | ORAL_TABLET | ORAL | Status: DC | PRN

## 2012-02-29 MED ORDER — PANTOPRAZOLE SODIUM 40 MG PO TBEC: 40.0000 mg | DELAYED_RELEASE_TABLET | Freq: Every day | ORAL | Status: DC

## 2012-02-29 MED ORDER — SODIUM CHLORIDE 0.9 % IV SOLN: 250.0000 mL | INTRAVENOUS | Status: DC

## 2012-02-29 MED ORDER — METHOCARBAMOL 100 MG/ML IJ SOLN: 500.0000 mg | Freq: Four times a day (QID) | INTRAVENOUS | Status: DC | PRN

## 2012-02-29 MED ORDER — LACTATED RINGERS IV SOLN: INTRAVENOUS | Status: DC

## 2012-02-29 MED ORDER — METOPROLOL SUCCINATE ER 50 MG PO TB24
75.0000 mg | ORAL_TABLET | Freq: Every morning | ORAL | Status: DC
  Filled 2012-02-29: qty 1

## 2012-02-29 MED ORDER — MAGNESIUM HYDROXIDE 400 MG/5ML PO SUSP: 30.0000 mL | Freq: Every day | ORAL | Status: DC | PRN

## 2012-02-29 MED ORDER — ONDANSETRON HCL 4 MG/2ML IJ SOLN
INTRAMUSCULAR | Status: DC | PRN
  Administered 2012-02-29: 4 mg via INTRAVENOUS

## 2012-02-29 MED ORDER — BISACODYL 10 MG RE SUPP: 10.0000 mg | Freq: Every day | RECTAL | Status: DC | PRN

## 2012-02-29 MED ORDER — SODIUM CHLORIDE 0.9 % IV SOLN
INTRAVENOUS | Status: AC
  Filled 2012-02-29: qty 500

## 2012-02-29 MED ORDER — METHOCARBAMOL 500 MG PO TABS
500.0000 mg | ORAL_TABLET | Freq: Four times a day (QID) | ORAL | Status: DC | PRN
  Administered 2012-03-01: 500 mg via ORAL
  Filled 2012-02-29: qty 1

## 2012-02-29 MED ORDER — BACITRACIN 50000 UNITS IM SOLR
INTRAMUSCULAR | Status: AC
  Filled 2012-02-29: qty 1

## 2012-02-29 MED ORDER — AMITRIPTYLINE HCL 25 MG PO TABS
125.0000 mg | ORAL_TABLET | Freq: Every day | ORAL | Status: DC
  Administered 2012-02-29: 125 mg via ORAL
  Filled 2012-02-29 (×2): qty 1

## 2012-02-29 MED ORDER — SODIUM CHLORIDE 0.9 % IJ SOLN: 3.0000 mL | Freq: Two times a day (BID) | INTRAMUSCULAR | Status: DC

## 2012-02-29 MED ORDER — MORPHINE SULFATE 2 MG/ML IJ SOLN
1.0000 mg | INTRAMUSCULAR | Status: DC | PRN
  Administered 2012-02-29: 2 mg via INTRAVENOUS
  Filled 2012-02-29: qty 1

## 2012-02-29 MED ORDER — AMITRIPTYLINE HCL 100 MG PO TABS: 100.0000 mg | ORAL_TABLET | Freq: Every day | ORAL | Status: DC

## 2012-02-29 MED ORDER — FENTANYL CITRATE 0.05 MG/ML IJ SOLN
INTRAMUSCULAR | Status: DC | PRN
  Administered 2012-02-29: 150 ug via INTRAVENOUS

## 2012-02-29 MED ORDER — NEOSTIGMINE METHYLSULFATE 1 MG/ML IJ SOLN
INTRAMUSCULAR | Status: DC | PRN
  Administered 2012-02-29: 5 mg via INTRAVENOUS

## 2012-02-29 MED ORDER — MUPIROCIN 2 % EX OINT
TOPICAL_OINTMENT | Freq: Two times a day (BID) | CUTANEOUS | Status: DC
  Administered 2012-02-29: 1 via NASAL
  Administered 2012-02-29: 21:00:00 via NASAL
  Filled 2012-02-29: qty 22

## 2012-02-29 MED ORDER — SODIUM CHLORIDE 0.9 % IJ SOLN: 3.0000 mL | INTRAMUSCULAR | Status: DC | PRN

## 2012-02-29 MED ORDER — MIDAZOLAM HCL 5 MG/5ML IJ SOLN
INTRAMUSCULAR | Status: DC | PRN
  Administered 2012-02-29: 2 mg via INTRAVENOUS

## 2012-02-29 MED ORDER — ZOLPIDEM TARTRATE 5 MG PO TABS: 5.0000 mg | ORAL_TABLET | Freq: Every evening | ORAL | Status: DC | PRN

## 2012-02-29 MED ORDER — CYCLOBENZAPRINE HCL 10 MG PO TABS: 10.0000 mg | ORAL_TABLET | Freq: Three times a day (TID) | ORAL | Status: DC | PRN

## 2012-02-29 MED ORDER — AMITRIPTYLINE HCL 25 MG PO TABS: 25.0000 mg | ORAL_TABLET | Freq: Every day | ORAL | Status: DC

## 2012-02-29 MED ORDER — LACTATED RINGERS IV SOLN
INTRAVENOUS | Status: DC | PRN
  Administered 2012-02-29 (×2): via INTRAVENOUS

## 2012-02-29 MED ORDER — PROPOFOL 10 MG/ML IV BOLUS
INTRAVENOUS | Status: DC | PRN
  Administered 2012-02-29: 160 mg via INTRAVENOUS

## 2012-02-29 MED ORDER — THROMBIN 5000 UNITS EX KIT
PACK | CUTANEOUS | Status: DC | PRN
  Administered 2012-02-29 (×2): 5000 [IU] via TOPICAL

## 2012-02-29 SURGICAL SUPPLY — 55 items
APL SKNCLS STERI-STRIP NONHPOA (GAUZE/BANDAGES/DRESSINGS) ×1
BAG DECANTER FOR FLEXI CONT (MISCELLANEOUS) ×2 IMPLANT
BENZOIN TINCTURE PRP APPL 2/3 (GAUZE/BANDAGES/DRESSINGS) ×2 IMPLANT
BLADE SURG ROTATE 9660 (MISCELLANEOUS) IMPLANT
BUR ROUND FLUTED 5 RND (BURR) ×2 IMPLANT
CANISTER SUCTION 2500CC (MISCELLANEOUS) ×2 IMPLANT
CLOTH BEACON ORANGE TIMEOUT ST (SAFETY) ×2 IMPLANT
CONT SPEC 4OZ CLIKSEAL STRL BL (MISCELLANEOUS) ×1 IMPLANT
DRAPE LAPAROTOMY 100X72X124 (DRAPES) ×2 IMPLANT
DRAPE MICROSCOPE LEICA (MISCELLANEOUS) ×2 IMPLANT
DRAPE POUCH INSTRU U-SHP 10X18 (DRAPES) ×2 IMPLANT
DRAPE SURG 17X23 STRL (DRAPES) ×2 IMPLANT
DRESSING TELFA 8X3 (GAUZE/BANDAGES/DRESSINGS) ×2 IMPLANT
DURAPREP 26ML APPLICATOR (WOUND CARE) ×2 IMPLANT
ELECT REM PT RETURN 9FT ADLT (ELECTROSURGICAL) ×2
ELECTRODE REM PT RTRN 9FT ADLT (ELECTROSURGICAL) ×1 IMPLANT
GAUZE SPONGE 4X4 16PLY XRAY LF (GAUZE/BANDAGES/DRESSINGS) IMPLANT
GLOVE BIOGEL PI IND STRL 7.5 (GLOVE) IMPLANT
GLOVE BIOGEL PI IND STRL 8 (GLOVE) IMPLANT
GLOVE BIOGEL PI INDICATOR 7.5 (GLOVE) ×1
GLOVE BIOGEL PI INDICATOR 8 (GLOVE) ×1
GLOVE ECLIPSE 7.5 STRL STRAW (GLOVE) ×5 IMPLANT
GLOVE EXAM NITRILE LRG STRL (GLOVE) IMPLANT
GLOVE EXAM NITRILE MD LF STRL (GLOVE) IMPLANT
GLOVE EXAM NITRILE XL STR (GLOVE) IMPLANT
GLOVE EXAM NITRILE XS STR PU (GLOVE) IMPLANT
GOWN BRE IMP SLV AUR LG STRL (GOWN DISPOSABLE) ×2 IMPLANT
GOWN BRE IMP SLV AUR XL STRL (GOWN DISPOSABLE) ×3 IMPLANT
GOWN STRL REIN 2XL LVL4 (GOWN DISPOSABLE) IMPLANT
KIT BASIN OR (CUSTOM PROCEDURE TRAY) ×2 IMPLANT
KIT ROOM TURNOVER OR (KITS) ×2 IMPLANT
NDL HYPO 18GX1.5 BLUNT FILL (NEEDLE) IMPLANT
NEEDLE HYPO 18GX1.5 BLUNT FILL (NEEDLE) IMPLANT
NEEDLE HYPO 22GX1.5 SAFETY (NEEDLE) ×4 IMPLANT
NS IRRIG 1000ML POUR BTL (IV SOLUTION) ×2 IMPLANT
PACK LAMINECTOMY NEURO (CUSTOM PROCEDURE TRAY) ×2 IMPLANT
PAD ARMBOARD 7.5X6 YLW CONV (MISCELLANEOUS) ×8 IMPLANT
PATTIES SURGICAL .75X.75 (GAUZE/BANDAGES/DRESSINGS) ×2 IMPLANT
RUBBERBAND STERILE (MISCELLANEOUS) ×4 IMPLANT
SLEEVE SURGEON STRL (DRAPES) ×1 IMPLANT
SPONGE GAUZE 4X4 12PLY (GAUZE/BANDAGES/DRESSINGS) ×2 IMPLANT
SPONGE LAP 4X18 X RAY DECT (DISPOSABLE) IMPLANT
SPONGE SURGIFOAM ABS GEL SZ50 (HEMOSTASIS) ×2 IMPLANT
STRIP CLOSURE SKIN 1/2X4 (GAUZE/BANDAGES/DRESSINGS) ×2 IMPLANT
SUT PROLENE 6 0 BV (SUTURE) IMPLANT
SUT VIC AB 0 CT1 18XCR BRD8 (SUTURE) ×1 IMPLANT
SUT VIC AB 0 CT1 8-18 (SUTURE) ×2
SUT VIC AB 2-0 CP2 18 (SUTURE) ×2 IMPLANT
SUT VIC AB 3-0 SH 8-18 (SUTURE) ×2 IMPLANT
SYR 20CC LL (SYRINGE) ×2 IMPLANT
SYR 5ML LL (SYRINGE) IMPLANT
TAPE CLOTH SURG 4X10 WHT LF (GAUZE/BANDAGES/DRESSINGS) ×1 IMPLANT
TOWEL OR 17X24 6PK STRL BLUE (TOWEL DISPOSABLE) ×2 IMPLANT
TOWEL OR 17X26 10 PK STRL BLUE (TOWEL DISPOSABLE) ×2 IMPLANT
WATER STERILE IRR 1000ML POUR (IV SOLUTION) ×2 IMPLANT

## 2012-02-29 NOTE — Anesthesia Preprocedure Evaluation (Addendum)
Anesthesia Evaluation  Patient identified by MRN, date of birth, ID band Patient awake    Reviewed: Allergy & Precautions, H&P , NPO status , Patient's Chart, lab work & pertinent test results  Airway Mallampati: I      Dental  (+) Edentulous Upper, Edentulous Lower and Dental Advisory Given   Pulmonary  breath sounds clear to auscultation        Cardiovascular hypertension, Rhythm:Regular Rate:Normal     Neuro/Psych  Headaches,    GI/Hepatic GERD-  Medicated and Controlled,  Endo/Other    Renal/GU      Musculoskeletal   Abdominal   Peds  Hematology   Anesthesia Other Findings   Reproductive/Obstetrics                          Anesthesia Physical Anesthesia Plan  ASA: II  Anesthesia Plan: General   Post-op Pain Management:    Induction: Intravenous  Airway Management Planned: Oral ETT  Additional Equipment:   Intra-op Plan:   Post-operative Plan: Extubation in OR  Informed Consent: I have reviewed the patients History and Physical, chart, labs and discussed the procedure including the risks, benefits and alternatives for the proposed anesthesia with the patient or authorized representative who has indicated his/her understanding and acceptance.     Plan Discussed with: CRNA and Surgeon  Anesthesia Plan Comments: (Chronic lumbar spondylosis Htn Normal coronaries and LV function by cardiac cath 8/12)        Anesthesia Quick Evaluation

## 2012-02-29 NOTE — Op Note (Signed)
02/29/2012  3:13 PM  PATIENT:  Dustin Carter  56 y.o. male  PRE-OPERATIVE DIAGNOSIS: HNP L5S1, Lumbar radiculopathy  POST-OPERATIVE DIAGNOSIS:   HNP L5S1, Lumbar radiculopathy  PROCEDURE:  Procedure(s): LUMBAR LAMINECTOMY/DECOMPRESSION MICRODISCECTOMY 1 LEVEL Right L5S1 - microdisection  SURGEON:  Surgeon(s): Clydene Fake, MD Temple Pacini, MD-assist   ANESTHESIA:   general  EBL:  Total I/O In: 1000 [I.V.:1000] Out: 25 [Blood:25]  BLOOD ADMINISTERED:none  DRAINS: none   SPECIMEN:  No Specimen  DICTATION: Patient with back and bilateral leg pain worse to the right and myelogram MRI done showing disc herniation right side paracentral with a fragment extending caudally patient brought in for discectomy.    Patient brought into operating room general anesthesia induced patient placed in prone position Wilson frame all pressure points padded. Patient prepped draped sterile fashion incision was made after injecting the area with 20 cc 1% lidocaine with epinephrine. Incision made midline lower lumbar spine and the end of the prior incision and extended caudally incision taken the fascia hemostasis obtained with Bovie cauterization fascia incised and subperiosteal dissection over the L5 and S1 spinous process lamina out to the facets are became retractors placed markers placed interspace and x-ray obtained confirming or positioning and L5-S1.   Scope was brought in for microdissection Kerrison punches were used to do a semi-hemi-laminectomy.  The limb was removed to expose the dura and nerve root from the S1 root.  We explored the epidural space and found fragment of disc upon the dura extending caudally using various hooks we mobilized the fragments of disc removed with pituitary rongeurs. We did not enter the disc space it hemostasis bipolar cauterization. About solution and hemostasis we did decompression room in the more fragments were found retractors removed fascia closed with 0  Vicryl interrupted sutures subcutaneous tissue closed with 021 through Vicryl interrupted sutures skin closed benzoin Steri-Strips dressing was placed patient was placed back in spine position woken (and transferred recovery room.   PATIENT DISPOSITION:  PACU - hemodynamically stable.

## 2012-02-29 NOTE — Transfer of Care (Signed)
Immediate Anesthesia Transfer of Care Note  Patient: Dustin Carter  Procedure(s) Performed: Procedure(s) (LRB) with comments: LUMBAR LAMINECTOMY/DECOMPRESSION MICRODISCECTOMY 1 LEVEL (Right) - Right Lumbar five-sacral oneDiskectomy  Patient Location: PACU  Anesthesia Type:General  Level of Consciousness: awake, alert  and oriented  Airway & Oxygen Therapy: Patient Spontanous Breathing and Patient connected to face mask oxygen  Post-op Assessment: Report given to PACU RN  Post vital signs: Reviewed and stable  Complications: No apparent anesthesia complications

## 2012-02-29 NOTE — H&P (Signed)
See H& P.

## 2012-02-29 NOTE — Anesthesia Postprocedure Evaluation (Signed)
  Anesthesia Post-op Note  Patient: Dustin Carter  Procedure(s) Performed: Procedure(s) (LRB) with comments: LUMBAR LAMINECTOMY/DECOMPRESSION MICRODISCECTOMY 1 LEVEL (Right) - Right Lumbar five-sacral oneDiskectomy  Patient Location: PACU  Anesthesia Type:General  Level of Consciousness: awake, alert  and oriented  Airway and Oxygen Therapy: Patient Spontanous Breathing and Patient connected to nasal cannula oxygen  Post-op Pain: mild  Post-op Assessment: Post-op Vital signs reviewed and Patient's Cardiovascular Status Stable  Post-op Vital Signs: stable  Complications: No apparent anesthesia complications

## 2012-02-29 NOTE — Preoperative (Signed)
Beta Blockers   Reason not to administer Beta Blockers:Not Applicable 

## 2012-02-29 NOTE — Interval H&P Note (Signed)
History and Physical Interval Note:  02/29/2012 8:35 AM  Dustin Carter  has presented today for surgery, with the diagnosis of Postlaminectomy syndrome, Lumbago, Lumbar radiculopathy  The various methods of treatment have been discussed with the patient and family. After consideration of risks, benefits and other options for treatment, the patient has consented to  Procedure(s) (LRB) with comments: LUMBAR LAMINECTOMY/DECOMPRESSION MICRODISCECTOMY 1 LEVEL (Right) - Right L5-S1 Diskectomy as a surgical intervention .  The patient's history has been reviewed, patient examined, no change in status, stable for surgery.  I have reviewed the patient's chart and labs.  Questions were answered to the patient's satisfaction.     Trystan Akhtar R

## 2012-03-01 ENCOUNTER — Encounter (HOSPITAL_COMMUNITY): Payer: Self-pay | Admitting: Neurosurgery

## 2012-03-01 MED ORDER — OXYCODONE-ACETAMINOPHEN 5-325 MG PO TABS: 1.0000 | ORAL_TABLET | ORAL | Status: DC | PRN

## 2012-03-01 NOTE — Progress Notes (Signed)
Pt given D/C instructions with Rx, verbal understanding given. Pt D/C'd home via wheelchair @ 0900 per MD order. Rema Fendt, RN

## 2012-03-01 NOTE — Discharge Summary (Signed)
Physician Discharge Summary  Patient ID: Dustin Carter MRN: [id number] DOB/AGE: Oct 11, 1955 56 y.o.  Admit date: 02/29/2012 Discharge date: 03/01/2012  Admission Diagnoses:HNP L5S1, Lumbar radiculopathy   Discharge Diagnoses: HNP L5S1, Lumbar radiculopathy  Active Problems:  * No active hospital problems. *    Discharged Condition: good  Hospital Course: pt admitted day of surgery - underwent procedure below - pt with less leg sx's - up ambulating, voiding  Consults: None  Significant Diagnostic Studies: none  Treatments: surgery: LUMBAR LAMINECTOMY/DECOMPRESSION MICRODISCECTOMY 1 LEVEL Right L5S1 - microdisection   Discharge Exam: Blood pressure 141/79, pulse 76, temperature 97.6 F (36.4 C), temperature source Oral, resp. rate 18, height 5\' 8"  (1.727 m), weight 83.915 kg (185 lb), SpO2 96.00%. Wound:c/d/i  Disposition: home     Medication List     As of 03/01/2012  8:00 AM    TAKE these medications         amitriptyline 100 MG tablet   Commonly known as: ELAVIL   Take 100 mg by mouth at bedtime. Take with a 25mg  tablet to make a 125mg  dose      amitriptyline 25 MG tablet   Commonly known as: ELAVIL   Take 25 mg by mouth at bedtime. Take along with 100mg  tablets to make a 125mg  dose      amLODipine 5 MG tablet   Commonly known as: NORVASC   Take 5 mg by mouth every morning.      atorvastatin 40 MG tablet   Commonly known as: LIPITOR   Take 40 mg by mouth at bedtime.      cyclobenzaprine 10 MG tablet   Commonly known as: FLEXERIL   Take 10 mg by mouth 3 (three) times daily as needed. For muscle spasms      gabapentin 300 MG capsule   Commonly known as: NEURONTIN   Take 300 mg by mouth 4 (four) times daily.      HYDROcodone-acetaminophen 7.5-325 MG per tablet   Commonly known as: NORCO   Take 1 tablet by mouth every 6 (six) hours as needed. For pain      meloxicam 15 MG tablet   Commonly known as: MOBIC   Take 15 mg by mouth daily.      metoprolol  succinate 50 MG 24 hr tablet   Commonly known as: TOPROL-XL   Take 75 mg by mouth every morning. Take with or immediately following a meal.      oxyCODONE-acetaminophen 5-325 MG per tablet   Commonly known as: PERCOCET/ROXICET   Take 1-2 tablets by mouth every 4 (four) hours as needed.      pantoprazole 40 MG tablet   Commonly known as: PROTONIX   Take 40 mg by mouth daily.         SignedClydene Fake, MD 03/01/2012, 8:00 AM

## 2012-04-01 ENCOUNTER — Inpatient Hospital Stay (HOSPITAL_COMMUNITY)
Admission: EM | Admit: 2012-04-01 | Discharge: 2012-04-04 | DRG: 871 | Disposition: A | Discharge status: Home / Self Care | Payer: Medicare Other | Attending: Internal Medicine | Admitting: Internal Medicine

## 2012-04-01 ENCOUNTER — Emergency Department (HOSPITAL_COMMUNITY): Payer: Medicare Other

## 2012-04-01 ENCOUNTER — Encounter (HOSPITAL_COMMUNITY): Payer: Self-pay | Admitting: *Deleted

## 2012-04-01 DIAGNOSIS — Z23 Encounter for immunization: Secondary | ICD-10-CM

## 2012-04-01 DIAGNOSIS — Z823 Family history of stroke: Secondary | ICD-10-CM

## 2012-04-01 DIAGNOSIS — I498 Other specified cardiac arrhythmias: Secondary | ICD-10-CM | POA: Diagnosis present

## 2012-04-01 DIAGNOSIS — J189 Pneumonia, unspecified organism: Secondary | ICD-10-CM

## 2012-04-01 DIAGNOSIS — N400 Enlarged prostate without lower urinary tract symptoms: Secondary | ICD-10-CM

## 2012-04-01 DIAGNOSIS — E860 Dehydration: Secondary | ICD-10-CM

## 2012-04-01 DIAGNOSIS — A419 Sepsis, unspecified organism: Principal | ICD-10-CM | POA: Diagnosis present

## 2012-04-01 DIAGNOSIS — I10 Essential (primary) hypertension: Secondary | ICD-10-CM | POA: Diagnosis present

## 2012-04-01 DIAGNOSIS — R079 Chest pain, unspecified: Secondary | ICD-10-CM

## 2012-04-01 DIAGNOSIS — Z87442 Personal history of urinary calculi: Secondary | ICD-10-CM

## 2012-04-01 DIAGNOSIS — E785 Hyperlipidemia, unspecified: Secondary | ICD-10-CM

## 2012-04-01 DIAGNOSIS — Y95 Nosocomial condition: Secondary | ICD-10-CM

## 2012-04-01 DIAGNOSIS — Z79899 Other long term (current) drug therapy: Secondary | ICD-10-CM

## 2012-04-01 DIAGNOSIS — Z8249 Family history of ischemic heart disease and other diseases of the circulatory system: Secondary | ICD-10-CM

## 2012-04-01 DIAGNOSIS — K920 Hematemesis: Secondary | ICD-10-CM

## 2012-04-01 DIAGNOSIS — Z87891 Personal history of nicotine dependence: Secondary | ICD-10-CM

## 2012-04-01 DIAGNOSIS — K219 Gastro-esophageal reflux disease without esophagitis: Secondary | ICD-10-CM

## 2012-04-01 DIAGNOSIS — Z981 Arthrodesis status: Secondary | ICD-10-CM

## 2012-04-01 DIAGNOSIS — R000 Tachycardia, unspecified: Secondary | ICD-10-CM

## 2012-04-01 LAB — URINALYSIS, ROUTINE W REFLEX MICROSCOPIC
Bilirubin Urine: NEGATIVE
Glucose, UA: NEGATIVE mg/dL
Hgb urine dipstick: NEGATIVE
Ketones, ur: NEGATIVE mg/dL
Leukocytes, UA: NEGATIVE
Nitrite: NEGATIVE
Protein, ur: NEGATIVE mg/dL
Specific Gravity, Urine: 1.007 (ref 1.005–1.030)
Urobilinogen, UA: 0.2 mg/dL (ref 0.0–1.0)
pH: 6 (ref 5.0–8.0)

## 2012-04-01 LAB — BASIC METABOLIC PANEL
BUN: 15 mg/dL (ref 6–23)
CO2: 25 mEq/L (ref 19–32)
Calcium: 9 mg/dL (ref 8.4–10.5)
Chloride: 102 mEq/L (ref 96–112)
Creatinine, Ser: 0.83 mg/dL (ref 0.50–1.35)
GFR calc Af Amer: >90 mL/min (ref >90)
GFR calc non Af Amer: >90 mL/min (ref >90)
Glucose, Bld: 107 mg/dL — ABNORMAL HIGH (ref 70–99)
Potassium: 3.4 mEq/L — ABNORMAL LOW (ref 3.5–5.1)
Sodium: 138 mEq/L (ref 135–145)

## 2012-04-01 LAB — CBC
HCT: 42.4 % (ref 39.0–52.0)
Hemoglobin: 13.9 g/dL (ref 13.0–17.0)
MCH: 28.8 pg (ref 26.0–34.0)
MCHC: 32.8 g/dL (ref 30.0–36.0)
MCV: 87.8 fL (ref 78.0–100.0)
Platelets: 168 10*3/uL (ref 150–400)
RBC: 4.83 MIL/uL (ref 4.22–5.81)
RDW: 13.6 % (ref 11.5–15.5)
WBC: 13.8 10*3/uL — ABNORMAL HIGH (ref 4.0–10.5)

## 2012-04-01 LAB — TROPONIN I: Troponin I: <0.3 ng/mL (ref <0.30)

## 2012-04-01 LAB — PROCALCITONIN: Procalcitonin: 4.54 ng/mL

## 2012-04-01 LAB — MAGNESIUM: Magnesium: 1.4 mg/dL — ABNORMAL LOW (ref 1.5–2.5)

## 2012-04-01 LAB — STREP PNEUMONIAE URINARY ANTIGEN: Strep Pneumo Urinary Antigen: NEGATIVE

## 2012-04-01 LAB — POCT I-STAT TROPONIN I: Troponin i, poc: 0.01 ng/mL (ref 0.00–0.08)

## 2012-04-01 LAB — LACTIC ACID, PLASMA: Lactic Acid, Venous: 4.3 mmol/L — ABNORMAL HIGH (ref 0.5–2.2)

## 2012-04-01 MED ORDER — ONDANSETRON HCL 4 MG/2ML IJ SOLN
4.0000 mg | Freq: Once | INTRAMUSCULAR | Status: AC
  Administered 2012-04-01: 4 mg via INTRAVENOUS
  Filled 2012-04-01: qty 2

## 2012-04-01 MED ORDER — GUAIFENESIN ER 600 MG PO TB12
1200.0000 mg | ORAL_TABLET | Freq: Two times a day (BID) | ORAL | Status: DC
  Administered 2012-04-01 – 2012-04-04 (×6): 1200 mg via ORAL
  Filled 2012-04-01 (×7): qty 2

## 2012-04-01 MED ORDER — LEVOFLOXACIN IN D5W 750 MG/150ML IV SOLN
750.0000 mg | INTRAVENOUS | Status: AC
  Administered 2012-04-01 – 2012-04-03 (×3): 750 mg via INTRAVENOUS
  Filled 2012-04-01 (×4): qty 150

## 2012-04-01 MED ORDER — VANCOMYCIN HCL IN DEXTROSE 1-5 GM/200ML-% IV SOLN
1000.0000 mg | Freq: Three times a day (TID) | INTRAVENOUS | Status: DC
  Administered 2012-04-01 – 2012-04-04 (×8): 1000 mg via INTRAVENOUS
  Filled 2012-04-01 (×10): qty 200

## 2012-04-01 MED ORDER — PANTOPRAZOLE SODIUM 40 MG PO TBEC
40.0000 mg | DELAYED_RELEASE_TABLET | Freq: Every day | ORAL | Status: DC
  Administered 2012-04-01 – 2012-04-04 (×4): 40 mg via ORAL
  Filled 2012-04-01 (×3): qty 1

## 2012-04-01 MED ORDER — SODIUM CHLORIDE 0.9 % IV BOLUS (SEPSIS)
1000.0000 mL | Freq: Once | INTRAVENOUS | Status: AC
  Administered 2012-04-01: 1000 mL via INTRAVENOUS

## 2012-04-01 MED ORDER — LEVALBUTEROL HCL 0.63 MG/3ML IN NEBU
0.6300 mg | INHALATION_SOLUTION | Freq: Four times a day (QID) | RESPIRATORY_TRACT | Status: DC
  Administered 2012-04-01 – 2012-04-04 (×9): 0.63 mg via RESPIRATORY_TRACT
  Filled 2012-04-01 (×15): qty 3

## 2012-04-01 MED ORDER — POTASSIUM CHLORIDE CRYS ER 20 MEQ PO TBCR
40.0000 meq | EXTENDED_RELEASE_TABLET | Freq: Once | ORAL | Status: AC
  Administered 2012-04-01: 40 meq via ORAL
  Filled 2012-04-01: qty 2

## 2012-04-01 MED ORDER — IPRATROPIUM BROMIDE 0.02 % IN SOLN
0.5000 mg | Freq: Once | RESPIRATORY_TRACT | Status: AC
  Administered 2012-04-01: 0.5 mg via RESPIRATORY_TRACT
  Filled 2012-04-01: qty 2.5

## 2012-04-01 MED ORDER — CYCLOBENZAPRINE HCL 10 MG PO TABS: 10.0000 mg | ORAL_TABLET | Freq: Three times a day (TID) | ORAL | Status: DC | PRN

## 2012-04-01 MED ORDER — SODIUM CHLORIDE 0.9 % IV SOLN
INTRAVENOUS | Status: DC
  Administered 2012-04-01 – 2012-04-02 (×3): via INTRAVENOUS
  Administered 2012-04-04: 20 mL/h via INTRAVENOUS

## 2012-04-01 MED ORDER — OXYCODONE-ACETAMINOPHEN 5-325 MG PO TABS
1.0000 | ORAL_TABLET | ORAL | Status: DC | PRN
  Administered 2012-04-01 – 2012-04-04 (×6): 2 via ORAL
  Filled 2012-04-01 (×6): qty 2

## 2012-04-01 MED ORDER — LEVOFLOXACIN IN D5W 750 MG/150ML IV SOLN
750.0000 mg | INTRAVENOUS | Status: DC
  Filled 2012-04-01: qty 150

## 2012-04-01 MED ORDER — ACETAMINOPHEN 325 MG PO TABS
650.0000 mg | ORAL_TABLET | ORAL | Status: DC | PRN
  Administered 2012-04-01: 650 mg via ORAL
  Filled 2012-04-01: qty 2

## 2012-04-01 MED ORDER — GUAIFENESIN-CODEINE 100-10 MG/5ML PO SOLN
5.0000 mL | Freq: Once | ORAL | Status: AC
  Administered 2012-04-01: 5 mL via ORAL
  Filled 2012-04-01: qty 5

## 2012-04-01 MED ORDER — AMITRIPTYLINE HCL 25 MG PO TABS
25.0000 mg | ORAL_TABLET | Freq: Every day | ORAL | Status: DC
  Administered 2012-04-01 – 2012-04-03 (×3): 25 mg via ORAL
  Filled 2012-04-01 (×4): qty 1

## 2012-04-01 MED ORDER — IPRATROPIUM BROMIDE 0.02 % IN SOLN
0.5000 mg | Freq: Four times a day (QID) | RESPIRATORY_TRACT | Status: DC
  Administered 2012-04-01 – 2012-04-04 (×9): 0.5 mg via RESPIRATORY_TRACT
  Filled 2012-04-01 (×9): qty 2.5

## 2012-04-01 MED ORDER — METOPROLOL SUCCINATE ER 50 MG PO TB24
75.0000 mg | ORAL_TABLET | Freq: Every morning | ORAL | Status: DC
  Administered 2012-04-02 – 2012-04-04 (×3): 75 mg via ORAL
  Filled 2012-04-01 (×3): qty 1

## 2012-04-01 MED ORDER — PIPERACILLIN-TAZOBACTAM 3.375 G IVPB
3.3750 g | Freq: Three times a day (TID) | INTRAVENOUS | Status: DC
  Administered 2012-04-01 – 2012-04-03 (×5): 3.375 g via INTRAVENOUS
  Filled 2012-04-01 (×7): qty 50

## 2012-04-01 MED ORDER — AMITRIPTYLINE HCL 100 MG PO TABS
100.0000 mg | ORAL_TABLET | Freq: Every day | ORAL | Status: DC
  Administered 2012-04-01 – 2012-04-03 (×3): 100 mg via ORAL
  Filled 2012-04-01 (×4): qty 1

## 2012-04-01 MED ORDER — AMLODIPINE BESYLATE 5 MG PO TABS
5.0000 mg | ORAL_TABLET | Freq: Every morning | ORAL | Status: DC
  Administered 2012-04-02 – 2012-04-04 (×3): 5 mg via ORAL
  Filled 2012-04-01 (×3): qty 1

## 2012-04-01 MED ORDER — DEXTROSE 5 % IV SOLN
1.0000 g | Freq: Three times a day (TID) | INTRAVENOUS | Status: DC
  Administered 2012-04-01: 1 g via INTRAVENOUS
  Filled 2012-04-01 (×4): qty 1

## 2012-04-01 MED ORDER — ATORVASTATIN CALCIUM 40 MG PO TABS
40.0000 mg | ORAL_TABLET | Freq: Every day | ORAL | Status: DC
  Administered 2012-04-01 – 2012-04-03 (×3): 40 mg via ORAL
  Filled 2012-04-01 (×4): qty 1

## 2012-04-01 MED ORDER — IPRATROPIUM BROMIDE 0.02 % IN SOLN
0.5000 mg | RESPIRATORY_TRACT | Status: DC | PRN
  Filled 2012-04-01: qty 2.5

## 2012-04-01 MED ORDER — SODIUM CHLORIDE 0.9 % IV SOLN
1000.0000 mL | Freq: Once | INTRAVENOUS | Status: AC
  Administered 2012-04-01: 1000 mL via INTRAVENOUS

## 2012-04-01 MED ORDER — MELOXICAM 15 MG PO TABS
15.0000 mg | ORAL_TABLET | Freq: Every morning | ORAL | Status: DC
  Administered 2012-04-02 – 2012-04-04 (×3): 15 mg via ORAL
  Filled 2012-04-01 (×3): qty 1

## 2012-04-01 MED ORDER — GABAPENTIN 300 MG PO CAPS
300.0000 mg | ORAL_CAPSULE | Freq: Four times a day (QID) | ORAL | Status: DC
  Administered 2012-04-01 – 2012-04-04 (×11): 300 mg via ORAL
  Filled 2012-04-01 (×14): qty 1

## 2012-04-01 MED ORDER — DEXTROSE 5 % IV SOLN
1.0000 g | Freq: Three times a day (TID) | INTRAVENOUS | Status: DC
  Filled 2012-04-01 (×2): qty 1

## 2012-04-01 MED ORDER — AMITRIPTYLINE HCL 25 MG PO TABS: 25.0000 mg | ORAL_TABLET | Freq: Every day | ORAL | Status: DC

## 2012-04-01 MED ORDER — PNEUMOCOCCAL VAC POLYVALENT 25 MCG/0.5ML IJ INJ
0.5000 mL | INJECTION | INTRAMUSCULAR | Status: AC
  Administered 2012-04-02: 0.5 mL via INTRAMUSCULAR
  Filled 2012-04-01: qty 0.5

## 2012-04-01 MED ORDER — LEVALBUTEROL HCL 0.63 MG/3ML IN NEBU
0.6300 mg | INHALATION_SOLUTION | RESPIRATORY_TRACT | Status: DC | PRN
  Filled 2012-04-01: qty 3

## 2012-04-01 MED ORDER — ALBUTEROL SULFATE (5 MG/ML) 0.5% IN NEBU
5.0000 mg | INHALATION_SOLUTION | Freq: Once | RESPIRATORY_TRACT | Status: AC
  Administered 2012-04-01: 5 mg via RESPIRATORY_TRACT
  Filled 2012-04-01: qty 1

## 2012-04-01 MED ORDER — ONDANSETRON HCL 4 MG/2ML IJ SOLN: 4.0000 mg | Freq: Four times a day (QID) | INTRAMUSCULAR | Status: DC | PRN

## 2012-04-01 NOTE — Progress Notes (Signed)
NURSING PROGRESS NOTE  Dustin Carter [id number] Admission Data: 04/01/2012 6:02 PM Attending Provider: Rodolph Bong, MD XBM:[id number],GMWNUU DENNIS, MD Code Status: full   Dustin Carter is a 56 y.o. male patient admitted from ED  No acute distress noted.  No c/o shortness of breath, no c/o chest pain.  Cardiac tele # [phone], in place, cardiac monitor yields:sinus tachycardia.  Blood pressure 111/67, pulse 120, temperature 98 F (36.7 C), temperature source Oral, resp. rate 20, height 5\' 8"  (1.727 m), weight 81.647 kg (180 lb), SpO2 97.00%.   IV Fluids:  IV in place, occlusive dsg intact without redness, IV cath antecubital right, condition patent and no redness normal saline.   Allergies:  Review of patient's allergies indicates no known allergies.  Past Medical History:   has a past medical history of Hyperlipidemia; Fusion of spine of cervical region; Fusion of spine of lumbar region; Hypertension; Chest pain; Kidney stones; BPH (benign prostatic hypertrophy) (Mar 29, 2011); GERD (gastroesophageal reflux disease); Arthritis; Headache; and BPH (benign prostatic hyperplasia) (04/01/2012).  Past Surgical History:   has past surgical history that includes back surgery x5 (1997-2011); neck surgery x4; Joint replacement (12/2009); Cardiac catheterization (11/02/11); Cholecystectomy (12/29/2011); Tonsillectomy; Lumbar laminectomy/decompression microdiscectomy (02/29/2012); and Back surgery.  Social History:   reports that he quit smoking about 19 years ago. His smoking use included Cigarettes. He has a 12 pack-year smoking history. He quit smokeless tobacco use about 31 years ago. His smokeless tobacco use included Chew. He reports that he does not drink alcohol or use illicit drugs.  Skin: intact   Orientation to room, and floor completed with information packet given to patient/family. Admission INP armband ID verified with patient/family, and in place.   SR up x 2, fall assessment complete, with  patient and family able to verbalize understanding of risk associated with falls, and verbalized understanding to call for assistance before getting out of bed.   Call light within reach. Patient able to voice and demonstrate understanding of unit orientation instructions.   Will cont to eval and treat per MD orders.  Gal Smolinski, Elmarie Mainland, RN

## 2012-04-01 NOTE — Progress Notes (Signed)
ANTIBIOTIC CONSULT NOTE - INITIAL  Pharmacy Consult for Vancomycin, Cefipime, Levofloxacin  Indication: RML/RLL pneumonia  No Known Allergies  Patient Measurements:   Adjusted Body Weight: 84kg last admit  Vital Signs: Temp: 97.9 F (36.6 C) (12/07 1257) Temp src: Oral (12/07 1257) BP: 127/75 mmHg (12/07 1615) Pulse Rate: 131  (12/07 1615) Intake/Output from previous day:   Intake/Output from this shift:    Labs:  Basename 04/01/12 1256  WBC 13.8*  HGB 13.9  PLT 168  LABCREA --  CREATININE 0.83   The CrCl is unknown because both a height and weight (above a minimum accepted value) are required for this calculation. No results found for this basename: VANCOTROUGH:2,VANCOPEAK:2,VANCORANDOM:2,GENTTROUGH:2,GENTPEAK:2,GENTRANDOM:2,TOBRATROUGH:2,TOBRAPEAK:2,TOBRARND:2,AMIKACINPEAK:2,AMIKACINTROU:2,AMIKACIN:2, in the last 72 hours   Microbiology: No results found for this or any previous visit (from the past 720 hour(s)).  Medical History: Past Medical History  Diagnosis Date  . Hyperlipidemia   . Fusion of spine of cervical region     BUT NO LIMITATIONS ROM  . Fusion of spine of lumbar region     CHRONIC BACK PAIN IF PT OVER EXERTS HIMSELF  . Hypertension   . Chest pain     JUNE AND JULY 2013-CARDIOLOGY WORK UP NEGATIVE FOR HEART PROBLEMS - PT WAS DIAGNOSED WITH GALLSTONES--THE STONES FELT TO BE CAUSE OF HIS CHEST PAIN  . Kidney stones     SURGERY X 1 AND OTHER STONES PT ABLE TO PASS--NO KNOWN STONES AT THE PRESENT  . BPH (benign prostatic hypertrophy) Mar 29, 2011    TREATED IN DR. TANNENBAUM'S OFFICE WITH "HEAT" THERAPY TO SHRINK THE PROSTATE  . GERD (gastroesophageal reflux disease)   . Arthritis     SPINE AND "HEAD TO TOES"  . Headache     "younger"  . BPH (benign prostatic hyperplasia) 04/01/2012    Assessment: 56yom with hx GERD, HLD, HTN s/p recept hospitalzation for CP and heart cath 12/2011 and laminectomy 02/2012.  He had an episode of black tarry emesis  today and then started having productive cough.  CXR shows RML/RLL pna worrisome for aspiration pneumonia and HCAP.  Will treat with broad coverage ABX to cover GP, GN and anaerobes.   Goal of Therapy:  Vancomycin trough level 15-20 mcg/ml  Plan:  1. Change Cefepime to Zosyn 3.375Gm IV q8hr EI to provide anaerobic coverage for aspiration pna as well as HAP coverage 2. Levofloxacin 750mg  IV q24h 3. Vancomycin 1Gm IV q8h  Marcelino Scot 04/01/2012,5:04 PM

## 2012-04-01 NOTE — ED Provider Notes (Signed)
History    56 year old male with general male ACE and cough. Onset this morning. Patient is mildly short of breath and having some chest pain when coughing. Cough is productive for blackish colored sputum. Subjective fever. Nausea but no vomiting. No unusual leg pain or swelling. Denies history of blood clot. Former smoker, quit in 1993.  CSN: [id number]  Arrival date & time 04/01/12  1243   First MD Initiated Contact with Patient 04/01/12 1414      Chief Complaint  Patient presents with  . Cough    (Consider location/radiation/quality/duration/timing/severity/associated sxs/prior treatment) HPI  Past Medical History  Diagnosis Date  . Hyperlipidemia   . Fusion of spine of cervical region     BUT NO LIMITATIONS ROM  . Fusion of spine of lumbar region     CHRONIC BACK PAIN IF PT OVER EXERTS HIMSELF  . Hypertension   . Chest pain     JUNE AND JULY 2013-CARDIOLOGY WORK UP NEGATIVE FOR HEART PROBLEMS - PT WAS DIAGNOSED WITH GALLSTONES--THE STONES FELT TO BE CAUSE OF HIS CHEST PAIN  . Kidney stones     SURGERY X 1 AND OTHER STONES PT ABLE TO PASS--NO KNOWN STONES AT THE PRESENT  . BPH (benign prostatic hypertrophy) Mar 29, 2011    TREATED IN DR. TANNENBAUM'S OFFICE WITH "HEAT" THERAPY TO SHRINK THE PROSTATE  . GERD (gastroesophageal reflux disease)   . Arthritis     SPINE AND "HEAD TO TOES"  . Headache     "younger"    Past Surgical History  Procedure Date  . Back surgery x5 1997-2011  . Neck surgery x4   . Joint replacement 12/2009    arm & shoulder  . Cardiac catheterization 11/02/11    NORMAL CORNARY ARTERIES  . Cholecystectomy 12/29/2011    Procedure: LAPAROSCOPIC CHOLECYSTECTOMY WITH INTRAOPERATIVE CHOLANGIOGRAM;  Surgeon: Atilano Ina, MD,FACS;  Location: [facility];  Service: General;  Laterality: N/A;  . Tonsillectomy     and adnoids removed  . Lumbar laminectomy/decompression microdiscectomy 02/29/2012    Procedure: LUMBAR LAMINECTOMY/DECOMPRESSION MICRODISCECTOMY 1  LEVEL;  Surgeon: Clydene Fake, MD;  Location: [facility];  Service: Neurosurgery;  Laterality: Right;  Right Lumbar five-sacral oneDiskectomy    Family History  Problem Relation Age of Onset  . Heart disease Father   . Hyperlipidemia Father   . Heart disease Brother   . Hyperlipidemia Brother   . Cancer Maternal Aunt     brain  . Heart disease Maternal Uncle   . Cancer Paternal Uncle     liver  . Heart disease Maternal Grandmother   . Stroke Paternal Grandfather   . Heart disease Brother   . Hyperlipidemia Brother   . Heart disease Maternal Uncle     History  Substance Use Topics  . Smoking status: Former Smoker -- 1.0 packs/day for 12 years    Types: Cigarettes    Quit date: 04/26/1992  . Smokeless tobacco: Former Neurosurgeon    Types: Chew    Quit date: 04/26/1980     Comment: used chew for 12 years before started smoking  . Alcohol Use: No      Review of Systems   Review of symptoms negative unless otherwise noted in HPI.   Allergies  Review of patient's allergies indicates no known allergies.  Home Medications   Current Outpatient Rx  Name  Route  Sig  Dispense  Refill  . AMITRIPTYLINE HCL 100 MG PO TABS   Oral   Take 100 mg  by mouth at bedtime. Take with a 25mg  tablet to make a 125mg  dose         . AMITRIPTYLINE HCL 25 MG PO TABS   Oral   Take 25 mg by mouth at bedtime. Take along with 100mg  tablets to make a 125mg  dose         . AMLODIPINE BESYLATE 5 MG PO TABS   Oral   Take 5 mg by mouth every morning.          . ATORVASTATIN CALCIUM 40 MG PO TABS   Oral   Take 40 mg by mouth at bedtime.         . CYCLOBENZAPRINE HCL 10 MG PO TABS   Oral   Take 10 mg by mouth 3 (three) times daily as needed. For muscle spasms         . GABAPENTIN 300 MG PO CAPS   Oral   Take 300 mg by mouth 4 (four) times daily.         Marland Kitchen HYDROCODONE-ACETAMINOPHEN 7.5-325 MG PO TABS   Oral   Take 1 tablet by mouth every 6 (six) hours as needed. For pain          . MELOXICAM 15 MG PO TABS   Oral   Take 15 mg by mouth every morning.          Marland Kitchen METOPROLOL SUCCINATE ER 50 MG PO TB24   Oral   Take 75 mg by mouth every morning. Take with or immediately following a meal.         . OXYCODONE-ACETAMINOPHEN 5-325 MG PO TABS   Oral   Take 1-2 tablets by mouth every 4 (four) hours as needed. For pain         . PANTOPRAZOLE SODIUM 40 MG PO TBEC   Oral   Take 40 mg by mouth daily.           BP 98/72  Pulse 121  Temp 97.9 F (36.6 C) (Oral)  Resp 13  SpO2 98%  Physical Exam  Nursing note and vitals reviewed. Constitutional: He appears well-developed and well-nourished. No distress.  HENT:  Head: Normocephalic and atraumatic.  Eyes: Conjunctivae normal are normal. Right eye exhibits no discharge. Left eye exhibits no discharge.  Neck: Neck supple.  Cardiovascular: Regular rhythm and normal heart sounds.  Exam reveals no gallop and no friction rub.   No murmur heard.      Tachycardic with a regular rhythm  Pulmonary/Chest:       Mild tachypnea. Right-sided rhonchi.  Abdominal: Soft. He exhibits no distension. There is no tenderness.  Musculoskeletal: He exhibits no edema and no tenderness.       Lower extremities symmetric as compared to each other. No calf tenderness. Negative Homan's. No palpable cords.   Neurological: He is alert.  Skin: Skin is warm and dry. He is not diaphoretic.  Psychiatric: He has a normal mood and affect. His behavior is normal. Thought content normal.    ED Course  Procedures (including critical care time)  Labs Reviewed  CBC - Abnormal; Notable for the following:    WBC 13.8 (*)     All other components within normal limits  BASIC METABOLIC PANEL - Abnormal; Notable for the following:    Potassium 3.4 (*)     Glucose, Bld 107 (*)     All other components within normal limits  POCT I-STAT TROPONIN I   Dg Chest 2 View  04/01/2012  *RADIOLOGY REPORT*  Clinical Data: Cough and shortness of  breath.  CHEST - 2 VIEW  Comparison: 02/29/2012.  Findings: The cardiac silhouette, mediastinal and hilar contours are normal and stable.  Interstitial and airspace process in the right lung could reflect asymmetric edema particularly given the Kerley B lines.  Could not exclude diffuse right infiltrate. Aspiration is possible.  The left lung is clear.  No pleural effusion.  IMPRESSION:  Diffuse interstitial and airspace process in the right lung most notable in the right middle and lower lobes.  This could reflect asymmetric pulmonary edema ( particularly given the appearance of Kerley B lines).  Infection is also possible.   Original Report Authenticated By: Rudie Meyer, M.D.    EKG:  Rhythm: Sinus tachycardia Vent. rate 146 BPM PR interval 134 ms QRS duration 74 ms QT/QTc 266/414 ms Right atrial enlargement ST segments: Nonspecific ST changes    1. HAP (hospital-acquired pneumonia)       MDM  56 year old male with general malaise and productive cough. Clinically pneumonia. Chest x-ray with multilobar right-sided infiltrate. I suspect that this is pneumonia and not pulmonary edema. Patient not at significant risk for aspiration. Patient significantly tachycardic and hypoxic to 90% on room air on arrival. Patient with 2 hospital stays in the past 3 months for surgical procedures. Because of this, will treat for possible healthcare associated pneumonia. Will discuss with hospitalist for admission.        Raeford Razor, MD 04/01/12 1520

## 2012-04-01 NOTE — ED Notes (Signed)
Reports having onset this am of productive cough with Savich/black sputum and generalized fatigue and bodyaches.

## 2012-04-01 NOTE — H&P (Signed)
[facility] History and Physical  Dustin Carter ZOX:[id number] DOB: 08/13/1955 DOA: 04/01/2012  Referring physician: Dr. Raeford Razor PCP: Michiel Sites, MD  Specialists: None  Chief Complaint: Cough/generalized weakness  HPI: Dustin Carter is a 56 y.o. male with a past medical history of hyperlipidemia, hypertension, BPH, gastroesophageal reflux disease, chest pain status post cardiac catheterization with normal coronaries in July of 2013 status post cholecystectomy September of 2013 and status post lumbar laminectomy/decompression microdiscectomy right L5-S1 02/29/2012 who presents to the ED with a several hour history of subjective fevers, productive cough of dark brownish sputum. Patient states 1 day prior to admission: Generalized weakness however was able to manage his affairs. Patient states he on the morning of admission woke up with a bitter taste in his mouth and generalized weakness. Patient stated that he had an episode of black tarry emesis as well and then started to have a productive cough of dark brownish sputum. Patient stated that he went back to bed woke up around noon did not feel too well and subsequently presented to the ED. Patient denied any fever, no chills, no dysuria, occasional constipation and diarrhea. Patient also complaining of abdominal pain and chest pain which he describes more as a burning sensation which was 6 out of a 10. Patient also describes chest pain as a tightness. Patient does endorse some ankle swelling and some urinary hesitancy. Patient was seen in the emergency room chest x-ray which was done was consistent with a multilobar right middle and lower lobe airspace disease. CBC done had a white count of 13.8. Basic metabolic profile and a potassium of 3.4. First set of troponin was 0.01. EKG showed a sinus tachycardia. Will call to admit the patient for further evaluation and management.  Review of Systems: The patient denies anorexia, fever,  weight loss,, vision loss, decreased hearing, hoarseness, chest pain, syncope, dyspnea on exertion, peripheral edema, balance deficits, hemoptysis, abdominal pain, melena, hematochezia, severe indigestion/heartburn, hematuria, incontinence, genital sores, muscle weakness, suspicious skin lesions, transient blindness, difficulty walking, depression, unusual weight change, abnormal bleeding, enlarged lymph nodes, angioedema, and breast masses.   Past Medical History  Diagnosis Date  . Hyperlipidemia   . Fusion of spine of cervical region     BUT NO LIMITATIONS ROM  . Fusion of spine of lumbar region     CHRONIC BACK PAIN IF PT OVER EXERTS HIMSELF  . Hypertension   . Chest pain     JUNE AND JULY 2013-CARDIOLOGY WORK UP NEGATIVE FOR HEART PROBLEMS - PT WAS DIAGNOSED WITH GALLSTONES--THE STONES FELT TO BE CAUSE OF HIS CHEST PAIN  . Kidney stones     SURGERY X 1 AND OTHER STONES PT ABLE TO PASS--NO KNOWN STONES AT THE PRESENT  . BPH (benign prostatic hypertrophy) Mar 29, 2011    TREATED IN DR. TANNENBAUM'S OFFICE WITH "HEAT" THERAPY TO SHRINK THE PROSTATE  . GERD (gastroesophageal reflux disease)   . Arthritis     SPINE AND "HEAD TO TOES"  . Headache     "younger"  . BPH (benign prostatic hyperplasia) 04/01/2012   Past Surgical History  Procedure Date  . Back surgery x5 1997-2011  . Neck surgery x4   . Joint replacement 12/2009    arm & shoulder  . Cardiac catheterization 11/02/11    NORMAL CORNARY ARTERIES  . Cholecystectomy 12/29/2011    Procedure: LAPAROSCOPIC CHOLECYSTECTOMY WITH INTRAOPERATIVE CHOLANGIOGRAM;  Surgeon: Atilano Ina, MD,FACS;  Location: [facility];  Service: General;  Laterality: N/A;  .  Tonsillectomy     and adnoids removed  . Lumbar laminectomy/decompression microdiscectomy 02/29/2012    Procedure: LUMBAR LAMINECTOMY/DECOMPRESSION MICRODISCECTOMY 1 LEVEL;  Surgeon: Clydene Fake, MD;  Location: [facility];  Service: Neurosurgery;  Laterality: Right;  Right Lumbar  five-sacral oneDiskectomy   Social History:  reports that he quit smoking about 19 years ago. His smoking use included Cigarettes. He has a 12 pack-year smoking history. He quit smokeless tobacco use about 31 years ago. His smokeless tobacco use included Chew. He reports that he does not drink alcohol or use illicit drugs.  No Known Allergies  Family History  Problem Relation Age of Onset  . Heart disease Father   . Hyperlipidemia Father   . Heart disease Brother   . Hyperlipidemia Brother   . Cancer Maternal Aunt     brain  . Heart disease Maternal Uncle   . Cancer Paternal Uncle     liver  . Heart disease Maternal Grandmother   . Stroke Paternal Grandfather   . Heart disease Brother   . Hyperlipidemia Brother   . Heart disease Maternal Uncle     Prior to Admission medications   Medication Sig Start Date End Date Taking? Authorizing Provider  amitriptyline (ELAVIL) 100 MG tablet Take 100 mg by mouth at bedtime. Take with a 25mg  tablet to make a 125mg  dose   Yes Historical Provider, MD  amitriptyline (ELAVIL) 25 MG tablet Take 25 mg by mouth at bedtime. Take along with 100mg  tablets to make a 125mg  dose   Yes Historical Provider, MD  amLODipine (NORVASC) 5 MG tablet Take 5 mg by mouth every morning.    Yes Historical Provider, MD  atorvastatin (LIPITOR) 40 MG tablet Take 40 mg by mouth at bedtime.   Yes Historical Provider, MD  cyclobenzaprine (FLEXERIL) 10 MG tablet Take 10 mg by mouth 3 (three) times daily as needed. For muscle spasms   Yes Historical Provider, MD  gabapentin (NEURONTIN) 300 MG capsule Take 300 mg by mouth 4 (four) times daily.   Yes Historical Provider, MD  HYDROcodone-acetaminophen (NORCO) 7.5-325 MG per tablet Take 1 tablet by mouth every 6 (six) hours as needed. For pain   Yes Historical Provider, MD  meloxicam (MOBIC) 15 MG tablet Take 15 mg by mouth every morning.    Yes Historical Provider, MD  metoprolol succinate (TOPROL-XL) 50 MG 24 hr tablet Take 75 mg  by mouth every morning. Take with or immediately following a meal.   Yes Historical Provider, MD  oxyCODONE-acetaminophen (PERCOCET/ROXICET) 5-325 MG per tablet Take 1-2 tablets by mouth every 4 (four) hours as needed. For pain 03/01/12  Yes Clydene Fake, MD  pantoprazole (PROTONIX) 40 MG tablet Take 40 mg by mouth daily.   Yes Historical Provider, MD   Physical Exam: Filed Vitals:   04/01/12 1257 04/01/12 1430 04/01/12 1500 04/01/12 1615  BP:  98/72 123/70 127/75  Pulse: 146 121 124 131  Temp: 97.9 F (36.6 C)     TempSrc: Oral     Resp:  13 22   SpO2:  98% 97% 95%     General:  Well-developed well-nourished speaking in complete sentences in no acute cardiopulmonary distress.  Eyes: Pupils equal round and reactive to light and accommodation. Extraocular movements intact.  ENT: Oropharynx is clear, no lesions, no exudates. Dry mucous membranes.  Neck: Supple with no lymphadenopathy.  Cardiovascular: Tachycardic, no murmurs rubs or gallops  Respiratory: Coarse diffuse breath sounds right greater than left. Positive rhonchi.  Abdomen: Soft, nontender, nondistended, positive bowel sounds  Skin: No rashes or lesions  Musculoskeletal: 5 out of 5 bilateral upper extremity strength. However bilateral lower extremity strength.  Psychiatric:  Normal mood. Normal affect. Good insight. Good judgment.  Neurologic: Alert and oriented x3. Cranial nerves II through XII are grossly intact. No focal deficits.  Labs on Admission:  Basic Metabolic Panel:  Lab 04/01/12 1610  NA 138  K 3.4*  CL 102  CO2 25  GLUCOSE 107*  BUN 15  CREATININE 0.83  CALCIUM 9.0  MG --  PHOS --   Liver Function Tests: No results found for this basename: AST:5,ALT:5,ALKPHOS:5,BILITOT:5,PROT:5,ALBUMIN:5 in the last 168 hours No results found for this basename: LIPASE:5,AMYLASE:5 in the last 168 hours No results found for this basename: AMMONIA:5 in the last 168 hours CBC:  Lab 04/01/12 1256  WBC  13.8*  NEUTROABS --  HGB 13.9  HCT 42.4  MCV 87.8  PLT 168   Cardiac Enzymes: No results found for this basename: CKTOTAL:5,CKMB:5,CKMBINDEX:5,TROPONINI:5 in the last 168 hours  BNP (last 3 results) No results found for this basename: PROBNP:3 in the last 8760 hours CBG: No results found for this basename: GLUCAP:5 in the last 168 hours  Radiological Exams on Admission: Dg Chest 2 View  04/01/2012  *RADIOLOGY REPORT*  Clinical Data: Cough and shortness of breath.  CHEST - 2 VIEW  Comparison: 02/29/2012.  Findings: The cardiac silhouette, mediastinal and hilar contours are normal and stable.  Interstitial and airspace process in the right lung could reflect asymmetric edema particularly given the Kerley B lines.  Could not exclude diffuse right infiltrate. Aspiration is possible.  The left lung is clear.  No pleural effusion.  IMPRESSION:  Diffuse interstitial and airspace process in the right lung most notable in the right middle and lower lobes.  This could reflect asymmetric pulmonary edema ( particularly given the appearance of Kerley B lines).  Infection is also possible.   Original Report Authenticated By: Rudie Meyer, M.D.     EKG: Independently reviewed. Sinus tachycardia  Assessment/Plan Principal Problem:  *HCAP (healthcare-associated pneumonia) Active Problems:  Sinus tachycardia  Coffee ground emesis  Dehydration  GERD (gastroesophageal reflux disease)  Chest pain  Hyperlipidemia  BPH (benign prostatic hyperplasia)   #1 healthcare associated pneumonia Patient presented with a cough, leukocytosis, chest x-ray consistent with right middle and lower lobe pneumonia. Patient recently hospitalized for back surgery in November of 2013 and is status post cholecystectomy in September of 2013. Will admit the patient to telemetry. Will check a sputum Gram stain and culture. Check a urine Legionella and urine pneumococcus antigen. Check blood cultures x2. Check a lactic acid  level. Check a procalcitonin level. Will place empirically on IV vancomycin, IV Levaquin, IV cefepime. We'll place on oxygen. Mucinex. Nebulizer treatments. Follow.  #2 sinus tachycardia Likely secondary to problem #1 and dehydration. Patient's chest pain seems to be more GI related. Will cycle enzymes x3. Patient had a recent cardiac catheterization done in July of 2013 which was normal. Hydrated with IV fluids. Continue empiric antibiotics. Check a TSH. Follow.  #3 coffee-ground emesis The patient does endorse some coffee-ground emesis. No further emesis past initial episode. Patient's hemoglobin is 13.9. Will monitor closely. Repeated CBC in the morning. Will place on a PPI. If hemoglobin drops significantly and patient has further coffee-ground emesis we'll consult with GI for further evaluation and management.  #4 gastroesophageal reflux disease PPI.  #5 dehydration IV fluids.  #6 chest pain Patient's chest pain  is more typical describes more as a tightness and a burning sensation. Patient had a recent cardiac catheterization in July of 2013 which was normal. Will cycle enzymes x3. Will place on PPI. Follow.  #7 hyperlipidemia Check a fasting lipid panel. Follow.  #8 prophylaxis PPI for GI prophylaxis. SCDs for DVT prophylaxis.   Code Status: Full Family Communication: Updated patient at bedside. Disposition Plan: Admit to telemetry  Time spent: 33 MINS  [facility] Pager [phone]  If 7PM-7AM, please contact night-coverage [email or web address] Password [email or web address] 04/01/2012, 4:41 PM

## 2012-04-02 DIAGNOSIS — E860 Dehydration: Secondary | ICD-10-CM

## 2012-04-02 LAB — CBC WITH DIFFERENTIAL/PLATELET
Basophils Absolute: 0 10*3/uL (ref 0.0–0.1)
Basophils Relative: 0 % (ref 0–1)
Eosinophils Absolute: 0.1 10*3/uL (ref 0.0–0.7)
Eosinophils Relative: 1 % (ref 0–5)
HCT: 35.7 % — ABNORMAL LOW (ref 39.0–52.0)
Hemoglobin: 11.5 g/dL — ABNORMAL LOW (ref 13.0–17.0)
Lymphocytes Relative: 9 % — ABNORMAL LOW (ref 12–46)
Lymphs Abs: 1.4 10*3/uL (ref 0.7–4.0)
MCH: 28.4 pg (ref 26.0–34.0)
MCHC: 32.2 g/dL (ref 30.0–36.0)
MCV: 88.1 fL (ref 78.0–100.0)
Monocytes Absolute: 0.7 10*3/uL (ref 0.1–1.0)
Monocytes Relative: 4 % (ref 3–12)
Neutro Abs: 14.1 10*3/uL — ABNORMAL HIGH (ref 1.7–7.7)
Neutrophils Relative %: 87 % — ABNORMAL HIGH (ref 43–77)
Platelets: 138 10*3/uL — ABNORMAL LOW (ref 150–400)
RBC: 4.05 MIL/uL — ABNORMAL LOW (ref 4.22–5.81)
RDW: 14.1 % (ref 11.5–15.5)
WBC: 16.3 10*3/uL — ABNORMAL HIGH (ref 4.0–10.5)

## 2012-04-02 LAB — LIPID PANEL
Cholesterol: 80 mg/dL (ref 0–200)
HDL: 50 mg/dL (ref >39)
LDL Cholesterol: 24 mg/dL (ref 0–99)
Total CHOL/HDL Ratio: 1.6 RATIO
Triglycerides: 32 mg/dL (ref <150)
VLDL: 6 mg/dL (ref 0–40)

## 2012-04-02 LAB — COMPREHENSIVE METABOLIC PANEL
ALT: 48 U/L (ref 0–53)
AST: 59 U/L — ABNORMAL HIGH (ref 0–37)
Albumin: 2.8 g/dL — ABNORMAL LOW (ref 3.5–5.2)
Alkaline Phosphatase: 131 U/L — ABNORMAL HIGH (ref 39–117)
BUN: 12 mg/dL (ref 6–23)
CO2: 25 mEq/L (ref 19–32)
Calcium: 8.7 mg/dL (ref 8.4–10.5)
Chloride: 108 mEq/L (ref 96–112)
Creatinine, Ser: 0.74 mg/dL (ref 0.50–1.35)
GFR calc Af Amer: >90 mL/min (ref >90)
GFR calc non Af Amer: >90 mL/min (ref >90)
Glucose, Bld: 81 mg/dL (ref 70–99)
Potassium: 3.9 mEq/L (ref 3.5–5.1)
Sodium: 141 mEq/L (ref 135–145)
Total Bilirubin: 0.4 mg/dL (ref 0.3–1.2)
Total Protein: 5.5 g/dL — ABNORMAL LOW (ref 6.0–8.3)

## 2012-04-02 LAB — EXPECTORATED SPUTUM ASSESSMENT W REFEX TO RESP CULTURE

## 2012-04-02 LAB — HIV ANTIBODY (ROUTINE TESTING W REFLEX): HIV: NONREACTIVE

## 2012-04-02 LAB — TROPONIN I
Troponin I: <0.3 ng/mL (ref <0.30)
Troponin I: <0.3 ng/mL (ref <0.30)

## 2012-04-02 LAB — TSH: TSH: 0.352 u[IU]/mL (ref 0.350–4.500)

## 2012-04-02 NOTE — Progress Notes (Signed)
Patient has a productive cough with scant amount of red blood in sputum.  Sputum tan in color.  Will continue to monitor.  Macarthur Critchley, RN

## 2012-04-02 NOTE — Progress Notes (Signed)
Physical Therapy Evaluation Patient Details Name: Dustin Carter MRN: [id number] DOB: 23-Jan-1956 Today's Date: 04/02/2012 Time: 7829-5621 PT Time Calculation (min): 25 min  PT Assessment / Plan / Recommendation Clinical Impression  56 yo admitted with pna presents to PT with decr activity tolerance; Will benefit from Pt to maximize independence and safety with activity/ambulation to enable safe dc home    PT Assessment  Patient needs continued PT services    Follow Up Recommendations  No PT follow up;Supervision - Intermittent    Does the patient have the potential to tolerate intense rehabilitation      Barriers to Discharge Decreased caregiver support      Equipment Recommendations  None recommended by PT    Recommendations for Other Services     Frequency Min 3X/week    Precautions / Restrictions Precautions Precautions: Other (comment) (watch O2 sats with amb)   Pertinent Vitals/Pain Ambulated on Room Air, and O2 sats greater tahn or equal to 94%; RN notified      Mobility  Bed Mobility Bed Mobility: Supine to Sit;Sitting - Scoot to Edge of Bed Supine to Sit: 5: Supervision Sitting - Scoot to Edge of Bed: 5: Supervision Details for Bed Mobility Assistance: Smooth transition; Used bedrails Transfers Transfers: Sit to Stand;Stand to Sit Sit to Stand: 5: Supervision;From bed Stand to Sit: 5: Supervision;To chair/3-in-1;With upper extremity assist Details for Transfer Assistance: Smooth transition; cues for hand placement and to self-monitor for activity tol Ambulation/Gait Ambulation/Gait Assistance: 4: Min guard (without physical contact) Ambulation Distance (Feet): 120 Feet Assistive device: Other (Comment) (Pushing IV pole) Ambulation/Gait Assistance Details: Overall smooth gait, with pt reprot of feeling weak, and some noted Dyspnea with Exertion (2/4); Ceus to self-monitor for shortness of breath and activity tolerance Gait Pattern: Within Functional Limits     Shoulder Instructions     Exercises     PT Diagnosis: Difficulty walking  PT Problem List: Decreased activity tolerance;Decreased mobility;Decreased knowledge of use of DME;Cardiopulmonary status limiting activity PT Treatment Interventions: DME instruction;Gait training;Stair training;Functional mobility training;Therapeutic activities;Therapeutic exercise;Balance training;Patient/family education   PT Goals Acute Rehab PT Goals PT Goal Formulation: With patient Time For Goal Achievement: 04/16/12 Potential to Achieve Goals: Good Pt will go Supine/Side to Sit: Independently PT Goal: Supine/Side to Sit - Progress: Goal set today Pt will go Sit to Supine/Side: Independently PT Goal: Sit to Supine/Side - Progress: Goal set today Pt will go Sit to Stand: Independently PT Goal: Sit to Stand - Progress: Goal set today Pt will go Stand to Sit: Independently PT Goal: Stand to Sit - Progress: Goal set today Pt will Ambulate: >150 feet;Independently PT Goal: Ambulate - Progress: Goal set today Pt will Go Up / Down Stairs: 6-9 stairs;with modified independence;with rail(s) PT Goal: Up/Down Stairs - Progress: Goal set today  Visit Information  Last PT Received On: 04/02/12 Assistance Needed: +1    Subjective Data  Subjective: Feeling better; agreeable t amb Patient Stated Goal: get rid of pna   Prior Functioning  Home Living Lives With: Family;Other (Comment) (mother, who is at [facility] and to be dc'd soon) Available Help at Discharge: Family;Available PRN/intermittently Type of Home: House Home Access: Stairs to enter [employer] of Steps:  (to be determined) Entrance Stairs-Rails:  (to be determines) Home Layout: One level Home Adaptive Equipment: Straight cane Additional Comments: Pt states his older brother will likely be staying with him and his mom, especially when his mom dc's from [employer] (not sure what she was in  [place] for); pt uses can prn post  lumbar surgery (?microdiscectomy) Prior Function Level of Independence: Independent with assistive device(s) Able to Take Stairs?: Yes Driving: Yes Communication Communication: No difficulties    Cognition  Overall Cognitive Status: Appears within functional limits for tasks assessed/performed Arousal/Alertness: Awake/alert Orientation Level: Appears intact for tasks assessed Behavior During Session: [facility] for tasks performed    Extremity/Trunk Assessment Right Upper Extremity Assessment RUE ROM/Strength/Tone: [facility] for tasks assessed Left Upper Extremity Assessment LUE ROM/Strength/Tone: [facility] for tasks assessed Right Lower Extremity Assessment RLE ROM/Strength/Tone: [facility] for tasks assessed Left Lower Extremity Assessment LLE ROM/Strength/Tone: [facility] for tasks assessed Trunk Assessment Trunk Assessment: Normal   Balance    End of Session PT - End of Session Equipment Utilized During Treatment: Oxygen Activity Tolerance: Patient tolerated treatment well Patient left: in chair;with call bell/phone within reach;with nursing in room Nurse Communication: Mobility status  GP     Van Clines [facility] [place], [place] [phone]  04/02/2012, 11:31 AM

## 2012-04-02 NOTE — Progress Notes (Signed)
PATIENT DETAILS Name: Dustin Carter Age: 56 y.o. Sex: male Date of Birth: 02-11-1956 Admit Date: 04/01/2012 Admitting Physician Rodolph Bong, MD JYN:[id number] DENNIS, MD  Subjective: Admitted with cough. Had one episode on ?coffee ground emesis.  Assessment/Plan: Principal Problem:  *HCAP (healthcare-associated pneumonia) -afebrile -but leukocytosis worse today -does not look toxic -will continue with current antibiotics-day 2  Active Problems: SIRS -2/2 to above -better with IVF and antibiotics   Sinus tachycardia -better and almost resolved   Coffee ground emesis -one episode-non since admission-do not think this a GI bleed at this time -mild decrease in Hb-but think this from IVF -monitor CBC -place on PPI   Dehydration -resolved with IVF  Chest Pain -doubt Cardiac- and this has resolved -trops X3 negative -had LHC on July 2013-normal coronaries -no further work up planned   GERD (gastroesophageal reflux disease) -PPI   Hyperlipidemia -c/w Statins  HTN -controlled with Amlodipine, Metoprolol  Disposition: Remain inpatient  DVT Prophylaxis: SCD's for now-if no further hematemesis then will start Prophylactic therapy  Code Status: Full code   Procedures:  None  CONSULTS:  None  PHYSICAL EXAM: Vital signs in last 24 hours: Filed Vitals:   04/01/12 1738 04/01/12 1739 04/01/12 2144 04/02/12 0432  BP:  111/67 120/72 116/75  Pulse:  120 111 105  Temp:  98 F (36.7 C) 97.9 F (36.6 C) 98.1 F (36.7 C)  TempSrc:  Oral Oral Oral  Resp:  20 18 18   Height: 5\' 8"  (1.727 m)     Weight: 81.647 kg (180 lb)     SpO2:  97% 97% 97%    Weight change:  Body mass index is 27.37 kg/(m^2).   Gen Exam: Awake and alert with clear speech.   Neck: Supple, No JVD.   Chest: B/L Clear-except rales on the right base CVS: S1 S2 Regular, no murmurs.  Abdomen: soft, BS +, non tender, non distended.  Extremities: no edema, lower extremities warm to  touch. Neurologic: Non Focal.   Skin: No Rash.   Wounds: N/A.    Intake/Output from previous day:  Intake/Output Summary (Last 24 hours) at 04/02/12 1002 Last data filed at 04/02/12 0858  Gross per 24 hour  Intake 2003.75 ml  Output    350 ml  Net 1653.75 ml     LAB RESULTS: CBC  Lab 04/02/12 0555 04/01/12 1256  WBC 16.3* 13.8*  HGB 11.5* 13.9  HCT 35.7* 42.4  PLT 138* 168  MCV 88.1 87.8  MCH 28.4 28.8  MCHC 32.2 32.8  RDW 14.1 13.6  LYMPHSABS 1.4 --  MONOABS 0.7 --  EOSABS 0.1 --  BASOSABS 0.0 --  BANDABS -- --    Chemistries   Lab 04/02/12 0555 04/01/12 1809 04/01/12 1256  NA 141 -- 138  K 3.9 -- 3.4*  CL 108 -- 102  CO2 25 -- 25  GLUCOSE 81 -- 107*  BUN 12 -- 15  CREATININE 0.74 -- 0.83  CALCIUM 8.7 -- 9.0  MG -- 1.4* --    CBG: No results found for this basename: GLUCAP:5 in the last 168 hours  GFR Estimated Creatinine Clearance: 97.4 ml/min (by C-G formula based on Cr of 0.74).  Coagulation profile No results found for this basename: INR:5,PROTIME:5 in the last 168 hours  Cardiac Enzymes  Lab 04/02/12 0555 04/01/12 2330 04/01/12 1713  CKMB -- -- --  TROPONINI <0.30 <0.30 <0.30  MYOGLOBIN -- -- --    No components found with this basename: POCBNP:3 No results  found for this basename: DDIMER:2 in the last 72 hours No results found for this basename: HGBA1C:2 in the last 72 hours  Basename 04/02/12 0555  CHOL 80  HDL 50  LDLCALC 24  TRIG 32  CHOLHDL 1.6  LDLDIRECT --    Basename 04/01/12 1809  TSH 0.352  T4TOTAL --  T3FREE --  THYROIDAB --   No results found for this basename: VITAMINB12:2,FOLATE:2,FERRITIN:2,TIBC:2,IRON:2,RETICCTPCT:2 in the last 72 hours No results found for this basename: LIPASE:2,AMYLASE:2 in the last 72 hours  Urine Studies No results found for this basename: UACOL:2,UAPR:2,USPG:2,UPH:2,UTP:2,UGL:2,UKET:2,UBIL:2,UHGB:2,UNIT:2,UROB:2,ULEU:2,UEPI:2,UWBC:2,URBC:2,UBAC:2,CAST:2,CRYS:2,UCOM:2,BILUA:2 in the  last 72 hours  MICROBIOLOGY: No results found for this or any previous visit (from the past 240 hour(s)).  RADIOLOGY STUDIES/RESULTS: Dg Chest 2 View  04/01/2012  *RADIOLOGY REPORT*  Clinical Data: Cough and shortness of breath.  CHEST - 2 VIEW  Comparison: 02/29/2012.  Findings: The cardiac silhouette, mediastinal and hilar contours are normal and stable.  Interstitial and airspace process in the right lung could reflect asymmetric edema particularly given the Kerley B lines.  Could not exclude diffuse right infiltrate. Aspiration is possible.  The left lung is clear.  No pleural effusion.  IMPRESSION:  Diffuse interstitial and airspace process in the right lung most notable in the right middle and lower lobes.  This could reflect asymmetric pulmonary edema ( particularly given the appearance of Kerley B lines).  Infection is also possible.   Original Report Authenticated By: Rudie Meyer, M.D.     MEDICATIONS: Scheduled Meds:   . [COMPLETED] sodium chloride  1,000 mL Intravenous Once  . [COMPLETED] albuterol  5 mg Nebulization Once  . amitriptyline  100 mg Oral QHS  . amitriptyline  25 mg Oral QHS  . amLODipine  5 mg Oral q morning - 10a  . atorvastatin  40 mg Oral QHS  . gabapentin  300 mg Oral QID  . guaiFENesin  1,200 mg Oral BID  . [COMPLETED] guaiFENesin-codeine  5 mL Oral Once  . [COMPLETED] ipratropium  0.5 mg Nebulization Once  . ipratropium  0.5 mg Nebulization Q6H  . levalbuterol  0.63 mg Nebulization Q6H  . levofloxacin (LEVAQUIN) IV  750 mg Intravenous Q24H  . meloxicam  15 mg Oral q morning - 10a  . metoprolol succinate  75 mg Oral q morning - 10a  . [COMPLETED] ondansetron (ZOFRAN) IV  4 mg Intravenous Once  . pantoprazole  40 mg Oral Daily  . piperacillin-tazobactam (ZOSYN)  IV  3.375 g Intravenous Q8H  . pneumococcal 23 valent vaccine  0.5 mL Intramuscular Tomorrow-1000  . [COMPLETED] potassium chloride  40 mEq Oral Once  . [COMPLETED] sodium chloride  1,000 mL  Intravenous Once  . vancomycin  1,000 mg Intravenous Q8H  . [DISCONTINUED] amitriptyline  25 mg Oral QHS  . [DISCONTINUED] ceFEPime (MAXIPIME) IV  1 g Intravenous Q8H  . [DISCONTINUED] ceFEPime (MAXIPIME) IV  1 g Intravenous Q8H  . [DISCONTINUED] levofloxacin (LEVAQUIN) IV  750 mg Intravenous Q24H   Continuous Infusions:   . sodium chloride 125 mL/hr at 04/01/12 1846   PRN Meds:.acetaminophen, cyclobenzaprine, ipratropium, levalbuterol, ondansetron (ZOFRAN) IV, oxyCODONE-acetaminophen  Antibiotics: Anti-infectives     Start     Dose/Rate Route Frequency Ordered Stop   04/01/12 2200  piperacillin-tazobactam (ZOSYN) IVPB 3.375 g       3.375 g 12.5 mL/hr over 240 Minutes Intravenous 3 times per day 04/01/12 1842     04/01/12 2000   ceFEPIme (MAXIPIME) 1 g in dextrose 5 % 50 mL IVPB  Status:  Discontinued        1 g 100 mL/hr over 30 Minutes Intravenous 3 times per day 04/01/12 1728 04/01/12 1756   04/01/12 2000   levofloxacin (LEVAQUIN) IVPB 750 mg  Status:  Discontinued        750 mg 100 mL/hr over 90 Minutes Intravenous Every 24 hours 04/01/12 1728 04/01/12 1755   04/01/12 1900   vancomycin (VANCOCIN) IVPB 1000 mg/200 mL premix        1,000 mg 200 mL/hr over 60 Minutes Intravenous Every 8 hours 04/01/12 1842     04/01/12 1515   ceFEPIme (MAXIPIME) 1 g in dextrose 5 % 50 mL IVPB  Status:  Discontinued        1 g 100 mL/hr over 30 Minutes Intravenous 3 times per day 04/01/12 1510 04/01/12 1841   04/01/12 1515   levofloxacin (LEVAQUIN) IVPB 750 mg        750 mg 100 mL/hr over 90 Minutes Intravenous Every 24 hours 04/01/12 1510 04/04/12 1559           Jeoffrey Massed, MD  [facility] Pager:[phone]  If 7PM-7AM, please contact night-coverage [email or web address] Password [email or web address] 04/02/2012, 10:02 AM   LOS: 1 day

## 2012-04-03 DIAGNOSIS — N400 Enlarged prostate without lower urinary tract symptoms: Secondary | ICD-10-CM

## 2012-04-03 LAB — DIFFERENTIAL
Basophils Absolute: 0 10*3/uL (ref 0.0–0.1)
Basophils Relative: 0 % (ref 0–1)
Eosinophils Absolute: 0.3 10*3/uL (ref 0.0–0.7)
Eosinophils Relative: 3 % (ref 0–5)
Lymphocytes Relative: 9 % — ABNORMAL LOW (ref 12–46)
Lymphs Abs: 1.3 10*3/uL (ref 0.7–4.0)
Monocytes Absolute: 0.8 10*3/uL (ref 0.1–1.0)
Monocytes Relative: 6 % (ref 3–12)
Neutro Abs: 11.2 10*3/uL — ABNORMAL HIGH (ref 1.7–7.7)
Neutrophils Relative %: 82 % — ABNORMAL HIGH (ref 43–77)

## 2012-04-03 LAB — BASIC METABOLIC PANEL
BUN: 10 mg/dL (ref 6–23)
CO2: 27 mEq/L (ref 19–32)
Calcium: 8.6 mg/dL (ref 8.4–10.5)
Chloride: 105 mEq/L (ref 96–112)
Creatinine, Ser: 0.9 mg/dL (ref 0.50–1.35)
GFR calc Af Amer: >90 mL/min (ref >90)
GFR calc non Af Amer: >90 mL/min (ref >90)
Glucose, Bld: 97 mg/dL (ref 70–99)
Potassium: 3.8 mEq/L (ref 3.5–5.1)
Sodium: 139 mEq/L (ref 135–145)

## 2012-04-03 LAB — CBC
HCT: 32 % — ABNORMAL LOW (ref 39.0–52.0)
Hemoglobin: 10.4 g/dL — ABNORMAL LOW (ref 13.0–17.0)
MCH: 29.2 pg (ref 26.0–34.0)
MCHC: 32.5 g/dL (ref 30.0–36.0)
MCV: 89.9 fL (ref 78.0–100.0)
Platelets: 130 10*3/uL — ABNORMAL LOW (ref 150–400)
RBC: 3.56 MIL/uL — ABNORMAL LOW (ref 4.22–5.81)
RDW: 14.4 % (ref 11.5–15.5)
WBC: 13.5 10*3/uL — ABNORMAL HIGH (ref 4.0–10.5)

## 2012-04-03 LAB — LEGIONELLA ANTIGEN, URINE: Legionella Antigen, Urine: NEGATIVE

## 2012-04-03 LAB — URINE CULTURE: Colony Count: 2000

## 2012-04-03 LAB — VANCOMYCIN, TROUGH: Vancomycin Tr: 18.2 ug/mL (ref 10.0–20.0)

## 2012-04-03 MED ORDER — ENOXAPARIN SODIUM 40 MG/0.4ML ~~LOC~~ SOLN
40.0000 mg | SUBCUTANEOUS | Status: DC
  Administered 2012-04-03: 40 mg via SUBCUTANEOUS
  Filled 2012-04-03 (×3): qty 0.4

## 2012-04-03 NOTE — Progress Notes (Addendum)
ANTIBIOTIC CONSULT NOTE - INITIAL  Pharmacy Consult for Vancomycin, Zosyn, Levofloxacin  Indication: RML/RLL pneumonia  No Known Allergies  Patient Measurements: Height: 5\' 8"  (172.7 cm) Weight: 180 lb (81.647 kg) IBW/kg (Calculated) : 68.4  Adjusted Body Weight: 84kg last admit  Vital Signs: BP: 110/70 mmHg (12/09 1036) Pulse Rate: 88  (12/09 1036) Intake/Output from previous day: 12/08 0701 - 12/09 0700 In: 4388.3 [P.O.:600; I.V.:2088.3; IV Piggyback:1700] Out: 500 [Urine:500] Intake/Output from this shift:    Labs:  Basename 04/03/12 0430 04/02/12 0555 04/01/12 1256  WBC 13.5* 16.3* 13.8*  HGB 10.4* 11.5* 13.9  PLT 130* 138* 168  LABCREA -- -- --  CREATININE 0.90 0.74 0.83   Estimated Creatinine Clearance: 86.6 ml/min (by C-G formula based on Cr of 0.9).  Basename 04/03/12 1100  VANCOTROUGH 18.2  VANCOPEAK --  VANCORANDOM --  GENTTROUGH --  GENTPEAK --  GENTRANDOM --  TOBRATROUGH --  TOBRAPEAK --  TOBRARND --  AMIKACINPEAK --  AMIKACINTROU --  AMIKACIN --     Microbiology: Recent Results (from the past 720 hour(s))  CULTURE, BLOOD (ROUTINE X 2)     Status: Normal (Preliminary result)   Collection Time   04/01/12  4:00 PM      Component Value Range Status Comment   Specimen Description BLOOD LEFT ARM   Final    Special Requests BOTTLES DRAWN AEROBIC AND ANAEROBIC 10CC   Final    Culture  Setup Time 04/01/2012 22:02   Final    Culture     Final    Value:        BLOOD CULTURE RECEIVED NO GROWTH TO DATE CULTURE WILL BE HELD FOR 5 DAYS BEFORE ISSUING A FINAL NEGATIVE REPORT   Report Status PENDING   Incomplete   CULTURE, BLOOD (ROUTINE X 2)     Status: Normal (Preliminary result)   Collection Time   04/01/12  4:03 PM      Component Value Range Status Comment   Specimen Description BLOOD LEFT HAND   Final    Special Requests BOTTLES DRAWN AEROBIC AND ANAEROBIC 10CC   Final    Culture  Setup Time 04/01/2012 22:02   Final    Culture     Final    Value:         BLOOD CULTURE RECEIVED NO GROWTH TO DATE CULTURE WILL BE HELD FOR 5 DAYS BEFORE ISSUING A FINAL NEGATIVE REPORT   Report Status PENDING   Incomplete   URINE CULTURE     Status: Normal   Collection Time   04/01/12  6:33 PM      Component Value Range Status Comment   Specimen Description URINE, RANDOM   Final    Special Requests NONE   Final    Culture  Setup Time 04/02/2012 05:54   Final    Colony Count 2,000 COLONIES/ML   Final    Culture INSIGNIFICANT GROWTH   Final    Report Status 04/03/2012 FINAL   Final   CULTURE, EXPECTORATED SPUTUM-ASSESSMENT     Status: Normal   Collection Time   04/02/12 10:22 AM      Component Value Range Status Comment   Specimen Description SPUTUM   Final    Special Requests NONE   Final    Sputum evaluation     Final    Value: THIS SPECIMEN IS ACCEPTABLE. RESPIRATORY CULTURE REPORT TO FOLLOW.   Report Status 04/02/2012 FINAL   Final   CULTURE, RESPIRATORY     Status: Normal (  Preliminary result)   Collection Time   04/02/12 10:22 AM      Component Value Range Status Comment   Specimen Description SPUTUM   Final    Special Requests NONE   Final    Gram Stain     Final    Value: ABUNDANT WBC PRESENT, PREDOMINANTLY PMN     FEW SQUAMOUS EPITHELIAL CELLS PRESENT     FEW GRAM POSITIVE RODS     RARE GRAM POSITIVE COCCI IN PAIRS   Culture NO GROWTH   Final    Report Status PENDING   Incomplete     Medical History: Past Medical History  Diagnosis Date  . Hyperlipidemia   . Fusion of spine of cervical region     BUT NO LIMITATIONS ROM  . Fusion of spine of lumbar region     CHRONIC BACK PAIN IF PT OVER EXERTS HIMSELF  . Hypertension   . Chest pain     JUNE AND JULY 2013-CARDIOLOGY WORK UP NEGATIVE FOR HEART PROBLEMS - PT WAS DIAGNOSED WITH GALLSTONES--THE STONES FELT TO BE CAUSE OF HIS CHEST PAIN  . Kidney stones     SURGERY X 1 AND OTHER STONES PT ABLE TO PASS--NO KNOWN STONES AT THE PRESENT  . BPH (benign prostatic hypertrophy) Mar 29, 2011     TREATED IN DR. TANNENBAUM'S OFFICE WITH "HEAT" THERAPY TO SHRINK THE PROSTATE  . GERD (gastroesophageal reflux disease)   . Arthritis     SPINE AND "HEAD TO TOES"  . Headache     "younger"  . BPH (benign prostatic hyperplasia) 04/01/2012    Assessment: 56yom with hx GERD, HLD, HTN s/p recept hospitalzation for CP and heart cath 12/2011 and laminectomy 02/2012.  CXR showed RML/RLL pna worrisome for aspiration pneumonia and HCAP, also with productive cough.  Currently treating with broad coverage ABX to cover GP, GN and anaerobes.  MD beginning to narrow therapy, d/c'd Zosyn today.  Vancomycin trough today = 18.2 which is within goal range.  Goal of Therapy:  Vancomycin trough level 15-20 mcg/ml  Plan:  1. Continue Vancomycin 1 g IV q 8hrs. 2. Continue Levofloxacin 750mg  IV q24h 3. F/U cultures 4. Will f/u plans to further narrow therapy.  Syniyah Bourne C 04/03/2012,1:12 PM

## 2012-04-03 NOTE — Progress Notes (Signed)
PATIENT DETAILS Name: Dustin Carter Age: 56 y.o. Sex: male Date of Birth: May 04, 1955 Admit Date: 04/01/2012 Admitting Physician Rodolph Bong, MD [id number] DENNIS, MD  Subjective: Admitted with cough. Had one episode on ?coffee ground emesis.  Assessment/Plan: Principal Problem:  *HCAP (healthcare-associated pneumonia) -afebrile - leukocytosis better today -does not look toxic -will continue with current antibiotics-day 3-but will d/c Zosyn-c/w Vanco/Levaquin  Active Problems: SIRS -2/2 to above -better with IVF and antibiotics   Sinus tachycardia -better and almost resolved   Coffee ground emesis -one episode-none since admission-do not think this a GI bleed at this time -mild decrease in Hb-but think this from IVF -monitor CBC -c/w PPI   Dehydration -resolved with IVF  Chest Pain -doubt Cardiac- and this has resolved -trops X3 negative -had LHC on July 2013-normal coronaries -no further work up planned   GERD (gastroesophageal reflux disease) -PPI   Hyperlipidemia -c/w Statins  HTN -controlled with Amlodipine, Metoprolol  Disposition: Remain inpatient-home in 1-2 days  DVT Prophylaxis: No further vomiting or hematemesis prophylactic Lovenox  Code Status: Full code   Procedures:  None  CONSULTS:  None  PHYSICAL EXAM: Vital signs in last 24 hours: Filed Vitals:   04/03/12 0744 04/03/12 1030 04/03/12 1036 04/03/12 1105  BP:  110/70 110/70   Pulse:   88   Temp:      TempSrc:      Resp:      Height:      Weight:      SpO2: 93%   98%    Weight change:  Body mass index is 27.37 kg/(m^2).   Gen Exam: Awake and alert with clear speech.   Neck: Supple, No JVD.   Chest: B/L Clear-except rales on the right base CVS: S1 S2 Regular, no murmurs.  Abdomen: soft, BS +, non tender, non distended.  Extremities: no edema, lower extremities warm to touch. Neurologic: Non Focal.   Skin: No Rash.   Wounds: N/A.    Intake/Output from  previous day:  Intake/Output Summary (Last 24 hours) at 04/03/12 1258 Last data filed at 04/03/12 0700  Gross per 24 hour  Intake 4028.33 ml  Output    500 ml  Net 3528.33 ml     LAB RESULTS: CBC  Lab 04/03/12 0430 04/02/12 0555 04/01/12 1256  WBC 13.5* 16.3* 13.8*  HGB 10.4* 11.5* 13.9  HCT 32.0* 35.7* 42.4  PLT 130* 138* 168  MCV 89.9 88.1 87.8  MCH 29.2 28.4 28.8  MCHC 32.5 32.2 32.8  RDW 14.4 14.1 13.6  LYMPHSABS 1.3 1.4 --  MONOABS 0.8 0.7 --  EOSABS 0.3 0.1 --  BASOSABS 0.0 0.0 --  BANDABS -- -- --    Chemistries   Lab 04/03/12 0430 04/02/12 0555 04/01/12 1809 04/01/12 1256  NA 139 141 -- 138  K 3.8 3.9 -- 3.4*  CL 105 108 -- 102  CO2 27 25 -- 25  GLUCOSE 97 81 -- 107*  BUN 10 12 -- 15  CREATININE 0.90 0.74 -- 0.83  CALCIUM 8.6 8.7 -- 9.0  MG -- -- 1.4* --    CBG: No results found for this basename: GLUCAP:5 in the last 168 hours  GFR Estimated Creatinine Clearance: 86.6 ml/min (by C-G formula based on Cr of 0.9).  Coagulation profile No results found for this basename: INR:5,PROTIME:5 in the last 168 hours  Cardiac Enzymes  Lab 04/02/12 0555 04/01/12 2330 04/01/12 1713  CKMB -- -- --  TROPONINI <0.30 <0.30 <0.30  MYOGLOBIN -- -- --  No components found with this basename: POCBNP:3 No results found for this basename: DDIMER:2 in the last 72 hours No results found for this basename: HGBA1C:2 in the last 72 hours  Basename 04/02/12 0555  CHOL 80  HDL 50  LDLCALC 24  TRIG 32  CHOLHDL 1.6  LDLDIRECT --    Basename 04/01/12 1809  TSH 0.352  T4TOTAL --  T3FREE --  THYROIDAB --   No results found for this basename: VITAMINB12:2,FOLATE:2,FERRITIN:2,TIBC:2,IRON:2,RETICCTPCT:2 in the last 72 hours No results found for this basename: LIPASE:2,AMYLASE:2 in the last 72 hours  Urine Studies No results found for this basename:  UACOL:2,UAPR:2,USPG:2,UPH:2,UTP:2,UGL:2,UKET:2,UBIL:2,UHGB:2,UNIT:2,UROB:2,ULEU:2,UEPI:2,UWBC:2,URBC:2,UBAC:2,CAST:2,CRYS:2,UCOM:2,BILUA:2 in the last 72 hours  MICROBIOLOGY: Recent Results (from the past 240 hour(s))  CULTURE, BLOOD (ROUTINE X 2)     Status: Normal (Preliminary result)   Collection Time   04/01/12  4:00 PM      Component Value Range Status Comment   Specimen Description BLOOD LEFT ARM   Final    Special Requests BOTTLES DRAWN AEROBIC AND ANAEROBIC 10CC   Final    Culture  Setup Time 04/01/2012 22:02   Final    Culture     Final    Value:        BLOOD CULTURE RECEIVED NO GROWTH TO DATE CULTURE WILL BE HELD FOR 5 DAYS BEFORE ISSUING A FINAL NEGATIVE REPORT   Report Status PENDING   Incomplete   CULTURE, BLOOD (ROUTINE X 2)     Status: Normal (Preliminary result)   Collection Time   04/01/12  4:03 PM      Component Value Range Status Comment   Specimen Description BLOOD LEFT HAND   Final    Special Requests BOTTLES DRAWN AEROBIC AND ANAEROBIC 10CC   Final    Culture  Setup Time 04/01/2012 22:02   Final    Culture     Final    Value:        BLOOD CULTURE RECEIVED NO GROWTH TO DATE CULTURE WILL BE HELD FOR 5 DAYS BEFORE ISSUING A FINAL NEGATIVE REPORT   Report Status PENDING   Incomplete   URINE CULTURE     Status: Normal   Collection Time   04/01/12  6:33 PM      Component Value Range Status Comment   Specimen Description URINE, RANDOM   Final    Special Requests NONE   Final    Culture  Setup Time 04/02/2012 05:54   Final    Colony Count 2,000 COLONIES/ML   Final    Culture INSIGNIFICANT GROWTH   Final    Report Status 04/03/2012 FINAL   Final   CULTURE, EXPECTORATED SPUTUM-ASSESSMENT     Status: Normal   Collection Time   04/02/12 10:22 AM      Component Value Range Status Comment   Specimen Description SPUTUM   Final    Special Requests NONE   Final    Sputum evaluation     Final    Value: THIS SPECIMEN IS ACCEPTABLE. RESPIRATORY CULTURE REPORT TO FOLLOW.    Report Status 04/02/2012 FINAL   Final   CULTURE, RESPIRATORY     Status: Normal (Preliminary result)   Collection Time   04/02/12 10:22 AM      Component Value Range Status Comment   Specimen Description SPUTUM   Final    Special Requests NONE   Final    Gram Stain     Final    Value: ABUNDANT WBC PRESENT, PREDOMINANTLY PMN     FEW  SQUAMOUS EPITHELIAL CELLS PRESENT     FEW GRAM POSITIVE RODS     RARE GRAM POSITIVE COCCI IN PAIRS   Culture NO GROWTH   Final    Report Status PENDING   Incomplete     RADIOLOGY STUDIES/RESULTS: Dg Chest 2 View  04/01/2012  *RADIOLOGY REPORT*  Clinical Data: Cough and shortness of breath.  CHEST - 2 VIEW  Comparison: 02/29/2012.  Findings: The cardiac silhouette, mediastinal and hilar contours are normal and stable.  Interstitial and airspace process in the right lung could reflect asymmetric edema particularly given the Kerley B lines.  Could not exclude diffuse right infiltrate. Aspiration is possible.  The left lung is clear.  No pleural effusion.  IMPRESSION:  Diffuse interstitial and airspace process in the right lung most notable in the right middle and lower lobes.  This could reflect asymmetric pulmonary edema ( particularly given the appearance of Kerley B lines).  Infection is also possible.   Original Report Authenticated By: Rudie Meyer, M.D.     MEDICATIONS: Scheduled Meds:    . amitriptyline  100 mg Oral QHS  . amitriptyline  25 mg Oral QHS  . amLODipine  5 mg Oral q morning - 10a  . atorvastatin  40 mg Oral QHS  . gabapentin  300 mg Oral QID  . guaiFENesin  1,200 mg Oral BID  . ipratropium  0.5 mg Nebulization Q6H  . levalbuterol  0.63 mg Nebulization Q6H  . levofloxacin (LEVAQUIN) IV  750 mg Intravenous Q24H  . meloxicam  15 mg Oral q morning - 10a  . metoprolol succinate  75 mg Oral q morning - 10a  . pantoprazole  40 mg Oral Daily  . vancomycin  1,000 mg Intravenous Q8H  . [DISCONTINUED] piperacillin-tazobactam (ZOSYN)  IV  3.375 g  Intravenous Q8H   Continuous Infusions:    . sodium chloride 75 mL/hr at 04/02/12 1813   PRN Meds:.acetaminophen, cyclobenzaprine, ipratropium, levalbuterol, ondansetron (ZOFRAN) IV, oxyCODONE-acetaminophen  Antibiotics: Anti-infectives     Start     Dose/Rate Route Frequency Ordered Stop   04/01/12 2200   piperacillin-tazobactam (ZOSYN) IVPB 3.375 g  Status:  Discontinued        3.375 g 12.5 mL/hr over 240 Minutes Intravenous 3 times per day 04/01/12 1842 04/03/12 0905   04/01/12 2000   ceFEPIme (MAXIPIME) 1 g in dextrose 5 % 50 mL IVPB  Status:  Discontinued        1 g 100 mL/hr over 30 Minutes Intravenous 3 times per day 04/01/12 1728 04/01/12 1756   04/01/12 2000   levofloxacin (LEVAQUIN) IVPB 750 mg  Status:  Discontinued        750 mg 100 mL/hr over 90 Minutes Intravenous Every 24 hours 04/01/12 1728 04/01/12 1755   04/01/12 1900   vancomycin (VANCOCIN) IVPB 1000 mg/200 mL premix        1,000 mg 200 mL/hr over 60 Minutes Intravenous Every 8 hours 04/01/12 1842     04/01/12 1515   ceFEPIme (MAXIPIME) 1 g in dextrose 5 % 50 mL IVPB  Status:  Discontinued        1 g 100 mL/hr over 30 Minutes Intravenous 3 times per day 04/01/12 1510 04/01/12 1841   04/01/12 1515   levofloxacin (LEVAQUIN) IVPB 750 mg        750 mg 100 mL/hr over 90 Minutes Intravenous Every 24 hours 04/01/12 1510 04/04/12 1559           Jeoffrey Massed, MD  [facility] Pager:[phone]  If 7PM-7AM, please contact night-coverage [email or web address] Password [email or web address] 04/03/2012, 12:58 PM   LOS: 2 days

## 2012-04-03 NOTE — Progress Notes (Addendum)
Occupational Therapy Evaluation Patient Details Name: Dustin Carter MRN: [id number] DOB: Sep 08, 1955 Today's Date: 04/03/2012 Time: 9629-5284 OT Time Calculation (min): 27 min  OT Assessment / Plan / Recommendation Clinical Impression  56 yo with CAP. PTA, pt lived independently and took care of his mother. Pt functioning @ overall Mod I level with ADL and mobility, however c/o fatigue. Educated pt on E conservation techniques. Pt has all DME needed for safe D/C home. O2 SATS remained >91 throughout session. Pt's brother will take care of his mother until pt has recooperated. No further OT needs.     OT Assessment  Patient does not need any further OT services    Follow Up Recommendations  No OT follow up    Barriers to Discharge  none    Equipment Recommendations  None recommended by OT    Recommendations for Other Services  none  Frequency    eval only   Precautions / Restrictions Precautions Precautions: Other (comment) (watch O2 SATS) Restrictions Weight Bearing Restrictions: No   Pertinent Vitals/Pain O2 SATS 91-94 RA during ADL and ambulation    ADL  Eating/Feeding: Independent Where Assessed - Eating/Feeding: Edge of bed Grooming: Independent Where Assessed - Grooming: Unsupported standing Upper Body Bathing: Modified independent Where Assessed - Upper Body Bathing: Unsupported sitting Lower Body Bathing: Modified independent Where Assessed - Lower Body Bathing: Unsupported sit to stand Upper Body Dressing: Modified independent Where Assessed - Upper Body Dressing: Unsupported sitting Lower Body Dressing: Modified independent Where Assessed - Lower Body Dressing: Unsupported sit to stand Toilet Transfer: Modified independent Toilet Transfer Method: Sit to Barista: Comfort height toilet Toileting - Clothing Manipulation and Hygiene: Independent Where Assessed - Engineer, mining and Hygiene: Standing Equipment Used: Gait  belt Transfers/Ambulation Related to ADLs: mod I ADL Comments: Educated pt on Energy conservation techniques for ADL.     OT Diagnosis:    OT Problem List:   OT Treatment Interventions:     OT Goals Acute Rehab OT Goals OT Goal Formulation:  (eval only)  Visit Information  Last OT Received On: 04/03/12 Assistance Needed: +1    Subjective Data      Prior Functioning     Home Living Lives With: Family;Other (Comment) (mother, who is at [facility] and to be dc'd soon) Available Help at Discharge: Family;Available PRN/intermittently Type of Home: House Home Access: Stairs to enter [employer] of Steps: 3 (to be determined) Entrance Stairs-Rails: Right (to be determines) Home Layout: One level Bathroom Shower/Tub: Tub/shower unit;Door [employer] Toilet: Standard Bathroom Accessibility: Yes How Accessible: Accessible via walker Home Adaptive Equipment: Straight cane;Walker - rolling;Wheelchair - manual;Bedside commode/3-in-1;Shower chair with back;Hand-held shower hose Additional Comments: Pt states his older brother will likely be staying with him and his mom, especially when his mom dc's from [facility] (not sure what she was in [facility] for); pt uses can prn post lumbar surgery (?microdiscectomy) Prior Function Level of Independence: Independent with assistive device(s) Able to Take Stairs?: Yes Driving: Yes Vocation: On disability Comments: back and neck (helps take care of mother) Communication Communication: No difficulties Dominant Hand: Right         Vision/Perception  wears glasses   Cognition  Overall Cognitive Status: Appears within functional limits for tasks assessed/performed Arousal/Alertness: Awake/alert Orientation Level: Appears intact for tasks assessed Behavior During Session: [facility] for tasks performed    Extremity/Trunk Assessment Right Upper Extremity Assessment RUE ROM/Strength/Tone: Within functional levels RUE Sensation:  WFL - Light Touch;WFL -  Proprioception RUE Coordination: WFL - gross/fine motor Left Upper Extremity Assessment LUE ROM/Strength/Tone: WFL for tasks assessed LUE Sensation: WFL - Light Touch;WFL - Proprioception LUE Coordination: WFL - gross/fine motor Right Lower Extremity Assessment RLE ROM/Strength/Tone: WFL for tasks assessed RLE Sensation: Deficits RLE Sensation Deficits: c/o numbness in R foot RLE Coordination: WFL - gross/fine motor Left Lower Extremity Assessment LLE ROM/Strength/Tone: WFL for tasks assessed LLE Sensation: Deficits LLE Sensation Deficits: c/o foot numbness Trunk Assessment Trunk Assessment: Normal     Mobility Bed Mobility Bed Mobility: Supine to Sit;Sitting - Scoot to Edge of Bed Supine to Sit: 6: Modified independent (Device/Increase time) Sitting - Scoot to Edge of Bed: 6: Modified independent (Device/Increase time) Transfers Transfers: Sit to Stand;Stand to Sit Sit to Stand: 6: Modified independent (Device/Increase time);With upper extremity assist;From bed Stand to Sit: 6: Modified independent (Device/Increase time);With upper extremity assist;To chair/3-in-1               Balance Balance Balance Assessed: Yes (WFL for ADL)   End of Session OT - End of Session Equipment Utilized During Treatment: Gait belt Activity Tolerance: Patient tolerated treatment well Patient left: in chair;with call bell/phone within reach Nurse Communication: Mobility status  GO     Shanty Ginty,HILLARY 04/03/2012, 9:38 AM Luisa Dago, OTR/L  [phone] 04/03/2012

## 2012-04-03 NOTE — Care Management Note (Signed)
    Page 1 of 1   04/04/2012     3:28:32 PM   CARE MANAGEMENT NOTE 04/04/2012  Patient:  Dustin Carter, Dustin Carter   Account Number:  [id number]  Date Initiated:  04/03/2012  Documentation initiated by:  Letha Cape  Subjective/Objective Assessment:   dx multilobe pna  admit     Action/Plan:   Anticipated DC Date:  04/04/2012   Anticipated DC Plan:  HOME/SELF CARE      DC Planning Services  CM consult      Choice offered to / List presented to:             Status of service:  Completed, signed off Medicare Important Message given?   (If response is "NO", the following Medicare IM given date fields will be blank) Date Medicare IM given:   Date Additional Medicare IM given:    Discharge Disposition:  HOME/SELF CARE  Per UR Regulation:  Reviewed for med. necessity/level of care/duration of stay  If discussed at Long Length of Stay Meetings, dates discussed:    Comments:  04/04/12 15:27 Letha Cape RN, BSN [phone] patient is independent, No needs anticipated at dc, pt for dc today.

## 2012-04-03 NOTE — Progress Notes (Signed)
Physical Therapy Treatment Patient Details Name: Dustin Carter MRN: [id number] DOB: 30-Aug-1955 Today's Date: 04/03/2012 Time: 0272-5366 PT Time Calculation (min): 15 min  PT Assessment / Plan / Recommendation Comments on Treatment Session  Pt adm with PNA. Pt making steady progress.    Follow Up Recommendations  No PT follow up     Does the patient have the potential to tolerate intense rehabilitation     Barriers to Discharge        Equipment Recommendations  None recommended by PT    Recommendations for Other Services    Frequency Min 3X/week   Plan Discharge plan remains appropriate;Frequency remains appropriate    Precautions / Restrictions Precautions Precautions: None Restrictions Weight Bearing Restrictions: No   Pertinent Vitals/Pain SaO2 - 98% on RA after amb    Mobility  Bed Mobility Bed Mobility: Supine to Sit;Sitting - Scoot to Edge of Bed Supine to Sit: 6: Modified independent (Device/Increase time) Sitting - Scoot to Edge of Bed: 6: Modified independent (Device/Increase time) Transfers Sit to Stand: 6: Modified independent (Device/Increase time);With upper extremity assist;From bed Stand to Sit: 6: Modified independent (Device/Increase time);With upper extremity assist;To chair/3-in-1 Details for Transfer Assistance: Verbal cues for hand placement Ambulation/Gait Ambulation/Gait Assistance: 5: Supervision Ambulation Distance (Feet): 160 Feet Assistive device: Straight cane (pushing IV pole) Gait Pattern: Within Functional Limits    Exercises     PT Diagnosis:    PT Problem List:   PT Treatment Interventions:     PT Goals Acute Rehab PT Goals PT Goal Formulation: With patient PT Goal: Supine/Side to Sit - Progress: Progressing toward goal PT Goal: Sit to Supine/Side - Progress: Progressing toward goal PT Goal: Sit to Stand - Progress: Progressing toward goal PT Goal: Stand to Sit - Progress: Progressing toward goal PT Goal: Ambulate - Progress:  Progressing toward goal  Visit Information  Last PT Received On: 04/03/12 Assistance Needed: +1    Subjective Data  Subjective: Pt feeling better.   Cognition  Overall Cognitive Status: Appears within functional limits for tasks assessed/performed Arousal/Alertness: Awake/alert Orientation Level: Appears intact for tasks assessed Behavior During Session: [employer] for tasks performed    Balance  Balance Balance Assessed: Yes (WFL for ADL)  End of Session PT - End of Session Activity Tolerance: Patient tolerated treatment well Patient left: in chair;with call bell/phone within reach;with family/visitor present Nurse Communication: Mobility status   GP     [facility] 04/03/2012, 11:43 AM  Skip Mayer PT [phone]

## 2012-04-04 DIAGNOSIS — E785 Hyperlipidemia, unspecified: Secondary | ICD-10-CM

## 2012-04-04 LAB — BASIC METABOLIC PANEL
BUN: 7 mg/dL (ref 6–23)
CO2: 28 mEq/L (ref 19–32)
Calcium: 8.6 mg/dL (ref 8.4–10.5)
Chloride: 106 mEq/L (ref 96–112)
Creatinine, Ser: 0.64 mg/dL (ref 0.50–1.35)
GFR calc Af Amer: >90 mL/min (ref >90)
GFR calc non Af Amer: >90 mL/min (ref >90)
Glucose, Bld: 99 mg/dL (ref 70–99)
Potassium: 3.4 mEq/L — ABNORMAL LOW (ref 3.5–5.1)
Sodium: 140 mEq/L (ref 135–145)

## 2012-04-04 LAB — CULTURE, RESPIRATORY W GRAM STAIN: Culture: NORMAL

## 2012-04-04 LAB — CBC
HCT: 32.1 % — ABNORMAL LOW (ref 39.0–52.0)
Hemoglobin: 10.7 g/dL — ABNORMAL LOW (ref 13.0–17.0)
MCH: 29.1 pg (ref 26.0–34.0)
MCHC: 33.3 g/dL (ref 30.0–36.0)
MCV: 87.2 fL (ref 78.0–100.0)
Platelets: 132 10*3/uL — ABNORMAL LOW (ref 150–400)
RBC: 3.68 MIL/uL — ABNORMAL LOW (ref 4.22–5.81)
RDW: 14 % (ref 11.5–15.5)
WBC: 10.7 10*3/uL — ABNORMAL HIGH (ref 4.0–10.5)

## 2012-04-04 LAB — CULTURE, RESPIRATORY

## 2012-04-04 MED ORDER — POTASSIUM CHLORIDE CRYS ER 20 MEQ PO TBCR
40.0000 meq | EXTENDED_RELEASE_TABLET | Freq: Once | ORAL | Status: AC
  Administered 2012-04-04: 40 meq via ORAL
  Filled 2012-04-04: qty 2

## 2012-04-04 MED ORDER — LEVOFLOXACIN IN D5W 750 MG/150ML IV SOLN
750.0000 mg | INTRAVENOUS | Status: DC
  Filled 2012-04-04: qty 150

## 2012-04-04 MED ORDER — LEVOFLOXACIN 750 MG PO TABS: 750.0000 mg | ORAL_TABLET | Freq: Every day | ORAL | Status: DC

## 2012-04-04 MED ORDER — LEVOFLOXACIN 750 MG PO TABS
750.0000 mg | ORAL_TABLET | Freq: Every day | ORAL | Status: DC
  Filled 2012-04-04: qty 1

## 2012-04-04 MED ORDER — LEVALBUTEROL HCL 0.63 MG/3ML IN NEBU: 0.6300 mg | INHALATION_SOLUTION | RESPIRATORY_TRACT | Status: DC | PRN

## 2012-04-04 NOTE — Discharge Summary (Signed)
PATIENT DETAILS Name: Dustin Carter Age: 56 y.o. Sex: male Date of Birth: [birth year] MRN: [id number]. Admit Date: 04/01/2012 Admitting Physician: Rodolph Bong, MD GEX:[id number] DENNIS, MD  Recommendations for Outpatient Follow-up:  1. Please repeat chest x-ray and 6-8 weeks to document resolution of pneumonia  PRIMARY DISCHARGE DIAGNOSIS:  Principal Problem:  *HCAP (healthcare-associated pneumonia) Active Problems:  Sinus tachycardia  Coffee ground emesis  Dehydration  GERD (gastroesophageal reflux disease)  Chest pain  Hyperlipidemia  BPH (benign prostatic hyperplasia)      PAST MEDICAL HISTORY: Past Medical History  Diagnosis Date  . Hyperlipidemia   . Fusion of spine of cervical region     BUT NO LIMITATIONS ROM  . Fusion of spine of lumbar region     CHRONIC BACK PAIN IF PT OVER EXERTS HIMSELF  . Hypertension   . Chest pain     JUNE AND JULY 2013-CARDIOLOGY WORK UP NEGATIVE FOR HEART PROBLEMS - PT WAS DIAGNOSED WITH GALLSTONES--THE STONES FELT TO BE CAUSE OF HIS CHEST PAIN  . Kidney stones     SURGERY X 1 AND OTHER STONES PT ABLE TO PASS--NO KNOWN STONES AT THE PRESENT  . BPH (benign prostatic hypertrophy) Mar 29, 2011    TREATED IN DR. TANNENBAUM'S OFFICE WITH "HEAT" THERAPY TO SHRINK THE PROSTATE  . GERD (gastroesophageal reflux disease)   . Arthritis     SPINE AND "HEAD TO TOES"  . Headache     "younger"  . BPH (benign prostatic hyperplasia) 04/01/2012    DISCHARGE MEDICATIONS:   Medication List     As of 04/04/2012  1:28 PM    TAKE these medications         amitriptyline 100 MG tablet   Commonly known as: ELAVIL   Take 100 mg by mouth at bedtime. Take with a 25mg  tablet to make a 125mg  dose      amitriptyline 25 MG tablet   Commonly known as: ELAVIL   Take 25 mg by mouth at bedtime. Take along with 100mg  tablets to make a 125mg  dose      amLODipine 5 MG tablet   Commonly known as: NORVASC   Take 5 mg by mouth every morning.       atorvastatin 40 MG tablet   Commonly known as: LIPITOR   Take 40 mg by mouth at bedtime.      cyclobenzaprine 10 MG tablet   Commonly known as: FLEXERIL   Take 10 mg by mouth 3 (three) times daily as needed. For muscle spasms      gabapentin 300 MG capsule   Commonly known as: NEURONTIN   Take 300 mg by mouth 4 (four) times daily.      HYDROcodone-acetaminophen 7.5-325 MG per tablet   Commonly known as: NORCO   Take 1 tablet by mouth every 6 (six) hours as needed. For pain      levalbuterol 0.63 MG/3ML nebulizer solution   Commonly known as: XOPENEX   Take 3 mLs (0.63 mg total) by nebulization every 4 (four) hours as needed for wheezing.      levofloxacin 750 MG tablet   Commonly known as: LEVAQUIN   Take 1 tablet (750 mg total) by mouth daily at 6 PM.      meloxicam 15 MG tablet   Commonly known as: MOBIC   Take 15 mg by mouth every morning.      metoprolol succinate 50 MG 24 hr tablet   Commonly known as: TOPROL-XL   Take 75 mg  by mouth every morning. Take with or immediately following a meal.      oxyCODONE-acetaminophen 5-325 MG per tablet   Commonly known as: PERCOCET/ROXICET   Take 1-2 tablets by mouth every 4 (four) hours as needed. For pain      pantoprazole 40 MG tablet   Commonly known as: PROTONIX   Take 40 mg by mouth daily.         BRIEF HPI:  See H&P, Labs, Consult and Test reports for all details in brief, patient is a 56 year old Caucasian male with past medical history of hypertension, dyslipidemia, gastroesophageal reflux disease who presented to the ED on 04/01/2012 with complaints of cough and generalized weakness. He was found to have pneumonia and admitted to the hospitalist service for further evaluation and treatment.  CONSULTATIONS:   None  PERTINENT RADIOLOGIC STUDIES: Dg Chest 2 View  04/01/2012  *RADIOLOGY REPORT*  Clinical Data: Cough and shortness of breath.  CHEST - 2 VIEW  Comparison: 02/29/2012.  Findings: The cardiac silhouette,  mediastinal and hilar contours are normal and stable.  Interstitial and airspace process in the right lung could reflect asymmetric edema particularly given the Kerley B lines.  Could not exclude diffuse right infiltrate. Aspiration is possible.  The left lung is clear.  No pleural effusion.  IMPRESSION:  Diffuse interstitial and airspace process in the right lung most notable in the right middle and lower lobes.  This could reflect asymmetric pulmonary edema ( particularly given the appearance of Kerley B lines).  Infection is also possible.   Original Report Authenticated By: Rudie Meyer, M.D.      PERTINENT LAB RESULTS: CBC:  Basename 04/04/12 0553 04/03/12 0430  WBC 10.7* 13.5*  HGB 10.7* 10.4*  HCT 32.1* 32.0*  PLT 132* 130*   CMET CMP     Component Value Date/Time   NA 140 04/04/2012 0553   K 3.4* 04/04/2012 0553   CL 106 04/04/2012 0553   CO2 28 04/04/2012 0553   GLUCOSE 99 04/04/2012 0553   BUN 7 04/04/2012 0553   CREATININE 0.64 04/04/2012 0553   CALCIUM 8.6 04/04/2012 0553   PROT 5.5* 04/02/2012 0555   ALBUMIN 2.8* 04/02/2012 0555   AST 59* 04/02/2012 0555   ALT 48 04/02/2012 0555   ALKPHOS 131* 04/02/2012 0555   BILITOT 0.4 04/02/2012 0555   GFRNONAA >90 04/04/2012 0553   GFRAA >90 04/04/2012 0553    GFR Estimated Creatinine Clearance: 97.4 ml/min (by C-G formula based on Cr of 0.64). No results found for this basename: LIPASE:2,AMYLASE:2 in the last 72 hours  Basename 04/02/12 0555 04/01/12 2330 04/01/12 1713  CKTOTAL -- -- --  CKMB -- -- --  CKMBINDEX -- -- --  TROPONINI <0.30 <0.30 <0.30   No components found with this basename: POCBNP:3 No results found for this basename: DDIMER:2 in the last 72 hours No results found for this basename: HGBA1C:2 in the last 72 hours  Basename 04/02/12 0555  CHOL 80  HDL 50  LDLCALC 24  TRIG 32  CHOLHDL 1.6  LDLDIRECT --    Basename 04/01/12 1809  TSH 0.352  T4TOTAL --  T3FREE --  THYROIDAB --   No results  found for this basename: VITAMINB12:2,FOLATE:2,FERRITIN:2,TIBC:2,IRON:2,RETICCTPCT:2 in the last 72 hours Coags: No results found for this basename: PT:2,INR:2 in the last 72 hours Microbiology: Recent Results (from the past 240 hour(s))  CULTURE, BLOOD (ROUTINE X 2)     Status: Normal (Preliminary result)   Collection Time   04/01/12  4:00  PM      Component Value Range Status Comment   Specimen Description BLOOD LEFT ARM   Final    Special Requests BOTTLES DRAWN AEROBIC AND ANAEROBIC 10CC   Final    Culture  Setup Time 04/01/2012 22:02   Final    Culture     Final    Value:        BLOOD CULTURE RECEIVED NO GROWTH TO DATE CULTURE WILL BE HELD FOR 5 DAYS BEFORE ISSUING A FINAL NEGATIVE REPORT   Report Status PENDING   Incomplete   CULTURE, BLOOD (ROUTINE X 2)     Status: Normal (Preliminary result)   Collection Time   04/01/12  4:03 PM      Component Value Range Status Comment   Specimen Description BLOOD LEFT HAND   Final    Special Requests BOTTLES DRAWN AEROBIC AND ANAEROBIC 10CC   Final    Culture  Setup Time 04/01/2012 22:02   Final    Culture     Final    Value:        BLOOD CULTURE RECEIVED NO GROWTH TO DATE CULTURE WILL BE HELD FOR 5 DAYS BEFORE ISSUING A FINAL NEGATIVE REPORT   Report Status PENDING   Incomplete   URINE CULTURE     Status: Normal   Collection Time   04/01/12  6:33 PM      Component Value Range Status Comment   Specimen Description URINE, RANDOM   Final    Special Requests NONE   Final    Culture  Setup Time 04/02/2012 05:54   Final    Colony Count 2,000 COLONIES/ML   Final    Culture INSIGNIFICANT GROWTH   Final    Report Status 04/03/2012 FINAL   Final   CULTURE, EXPECTORATED SPUTUM-ASSESSMENT     Status: Normal   Collection Time   04/02/12 10:22 AM      Component Value Range Status Comment   Specimen Description SPUTUM   Final    Special Requests NONE   Final    Sputum evaluation     Final    Value: THIS SPECIMEN IS ACCEPTABLE. RESPIRATORY CULTURE  REPORT TO FOLLOW.   Report Status 04/02/2012 FINAL   Final   CULTURE, RESPIRATORY     Status: Normal (Preliminary result)   Collection Time   04/02/12 10:22 AM      Component Value Range Status Comment   Specimen Description SPUTUM   Final    Special Requests NONE   Final    Gram Stain     Final    Value: ABUNDANT WBC PRESENT, PREDOMINANTLY PMN     FEW SQUAMOUS EPITHELIAL CELLS PRESENT     FEW GRAM POSITIVE RODS     RARE GRAM POSITIVE COCCI IN PAIRS   Culture NO GROWTH   Final    Report Status PENDING   Incomplete      BRIEF HOSPITAL COURSE:   Principal Problem:  *HCAP (healthcare-associated pneumonia) - As noted above patient on admission had  cough and generalized weakness. He also had leukocytosis with a WBC count of 16.3. Since he was hospitalized recently for a cholecystectomy and also for a recent back surgery he was thought to have healthcare associated pneumonia, empirically started on vancomycin, Zosyn and Levaquin. With these measures he started to quickly improve, Zosyn was discontinued on 12/9 and vancomycin was discontinued on 12/10, he is doing much better. His leukocytosis has come down to 10.7. He is now ambulating in the  room and in the hallway without much difficulty. His O2 saturation after ambulation is around 97% per physical therapy notes. He is anxious to be discharged home, as a result we will continue him on Levaquin for another 4-5 days. He will followup with his primary care practitioner for a repeat chest x-ray to be done in around 6-8 weeks' time to document resolution of his pneumonia.  Active Problems: Systemic inflammation response syndrome - Is from above, patient was tachycardic. He was treated with the above-noted antibiotics with resolution of the tachycardia and also resolution of SIRS.  Sinus tachycardia - This is secondary to above, this is resolved  One episode of vomiting-? Coffee ground - Patient had apparently one episode of questionable  coffee-ground any since. He did not have any further vomiting since admission. His hemoglobin on admission was 13.9, he was given IV fluids and the next day it decreased to 11.5. For the past 2 days it has been stable at 10.4 and 10.7. I do not think he had gastrointestinal bleeding as a result I have not pursued any further investigations. It is presumed that his drop in hemoglobin was from IV fluids and also from his acute illness.  Chest pain - This was atypical, cardiac enzymes were cycled and these were negative. -Patient had a cardiac catheterization done on July of 2013 with essentially showed normal coronaries. Since the chest pain resolved, and patient is clinically improved after treatment of pneumonia no further investigations are currently planned during his inpatient stay.  Gastroesophageal reflux disease -Continue with PPI  Hyperlipidemia  -c/w Statins   HTN  -controlled with Amlodipine, Metoprolol   TODAY-DAY OF DISCHARGE:  Subjective:   Dustin Carter today has no headache,no chest abdominal pain,no new weakness tingling or numbness, feels much better wants to go home today.   Objective:   Blood pressure 113/67, pulse 96, temperature 98 F (36.7 C), temperature source Oral, resp. rate 20, height 5\' 8"  (1.727 m), weight 81.647 kg (180 lb), SpO2 100.00%.  Intake/Output Summary (Last 24 hours) at 04/04/12 1328 Last data filed at 04/04/12 1146  Gross per 24 hour  Intake 2246.25 ml  Output   1662 ml  Net 584.25 ml    Exam Awake Alert, Oriented *3, No new F.N deficits, Normal affect [place].AT,PERRAL Supple Neck,No JVD, No cervical lymphadenopathy appriciated.  Symmetrical Chest wall movement, Good air movement bilaterally, CTAB RRR,No Gallops,Rubs or new Murmurs, No Parasternal Heave +ve B.Sounds, Abd Soft, Non tender, No organomegaly appriciated, No rebound -guarding or rigidity. No Cyanosis, Clubbing or edema, No new Rash or bruise  DISCHARGE  CONDITION: Stable  DISPOSITION: HOME  DISCHARGE INSTRUCTIONS:    Activity:  As tolerated   Diet recommendation: Heart Healthy diet  Total Time spent on discharge equals 45 minutes.  SignedJeoffrey Massed 04/04/2012 1:28 PM

## 2012-04-04 NOTE — Plan of Care (Signed)
Problem: Discharge Progression Outcomes Goal: Vaccine documented on D/C instructions Outcome: Completed/Met Date Met:  04/04/12 Had PNA here and already had Flu this season

## 2012-04-04 NOTE — Progress Notes (Signed)
Dustin Carter discharged Home per MD order.  Discharge instructions reviewed and discussed with the patient, all questions and concerns answered. Copy of instructions and scripts given to patient.   Kaimen, Peine  Home Medication Instructions [id number]   Printed on:04/04/12 1438  Medication Information                    gabapentin (NEURONTIN) 300 MG capsule Take 300 mg by mouth 4 (four) times daily.           metoprolol succinate (TOPROL-XL) 50 MG 24 hr tablet Take 75 mg by mouth every morning. Take with or immediately following a meal.           amLODipine (NORVASC) 5 MG tablet Take 5 mg by mouth every morning.            amitriptyline (ELAVIL) 100 MG tablet Take 100 mg by mouth at bedtime. Take with a 25mg  tablet to make a 125mg  dose           cyclobenzaprine (FLEXERIL) 10 MG tablet Take 10 mg by mouth 3 (three) times daily as needed. For muscle spasms           pantoprazole (PROTONIX) 40 MG tablet Take 40 mg by mouth daily.           meloxicam (MOBIC) 15 MG tablet Take 15 mg by mouth every morning.            amitriptyline (ELAVIL) 25 MG tablet Take 25 mg by mouth at bedtime. Take along with 100mg  tablets to make a 125mg  dose           HYDROcodone-acetaminophen (NORCO) 7.5-325 MG per tablet Take 1 tablet by mouth every 6 (six) hours as needed. For pain           atorvastatin (LIPITOR) 40 MG tablet Take 40 mg by mouth at bedtime.           oxyCODONE-acetaminophen (PERCOCET/ROXICET) 5-325 MG per tablet Take 1-2 tablets by mouth every 4 (four) hours as needed. For pain           levalbuterol (XOPENEX) 0.63 MG/3ML nebulizer solution Take 3 mLs (0.63 mg total) by nebulization every 4 (four) hours as needed for wheezing.           levofloxacin (LEVAQUIN) 750 MG tablet Take 1 tablet (750 mg total) by mouth daily at 6 PM.             Patients skin is clean, dry and intact, no evidence of skin break down. IV site discontinued and catheter remains intact. Site without  signs and symptoms of complications. Dressing and pressure applied.  Patient escorted to car by NT in a wheelchair,  no distress noted upon discharge.  Laural Benes, Dustin Carter 04/04/2012 2:38 PM

## 2012-04-07 LAB — CULTURE, BLOOD (ROUTINE X 2)
Culture: NO GROWTH
Culture: NO GROWTH

## 2012-06-10 ENCOUNTER — Other Ambulatory Visit: Payer: Self-pay

## 2013-03-01 ENCOUNTER — Other Ambulatory Visit: Payer: Self-pay

## 2013-03-07 ENCOUNTER — Other Ambulatory Visit: Payer: Self-pay | Admitting: Neurosurgery

## 2013-03-07 DIAGNOSIS — M542 Cervicalgia: Secondary | ICD-10-CM

## 2013-03-12 ENCOUNTER — Ambulatory Visit
Admission: RE | Admit: 2013-03-12 | Discharge: 2013-03-12 | Disposition: A | Discharge status: Home / Self Care | Payer: Medicare Other | Source: Ambulatory Visit | Attending: Neurosurgery | Admitting: Neurosurgery

## 2013-03-12 VITALS — BP 140/81 | HR 60

## 2013-03-12 DIAGNOSIS — M542 Cervicalgia: Secondary | ICD-10-CM

## 2013-03-12 MED ORDER — DIAZEPAM 5 MG PO TABS
10.0000 mg | ORAL_TABLET | Freq: Once | ORAL | Status: AC
  Administered 2013-03-12: 10 mg via ORAL

## 2013-03-12 MED ORDER — IOHEXOL 300 MG/ML  SOLN
10.0000 mL | Freq: Once | INTRAMUSCULAR | Status: AC | PRN
  Administered 2013-03-12: 10 mL via INTRATHECAL

## 2013-03-12 MED ORDER — OXYCODONE-ACETAMINOPHEN 5-325 MG PO TABS
2.0000 | ORAL_TABLET | Freq: Once | ORAL | Status: AC
  Administered 2013-03-12: 2 via ORAL

## 2013-04-05 IMAGING — CR DG LUMBAR SPINE 1V
1 series · 1 of 1 positions shown · non-contrast
Comparison: MR 02/23/2012

CLINICAL DATA: Microdiskectomy

LUMBAR SPINE - 1 VIEW

[view not recorded]
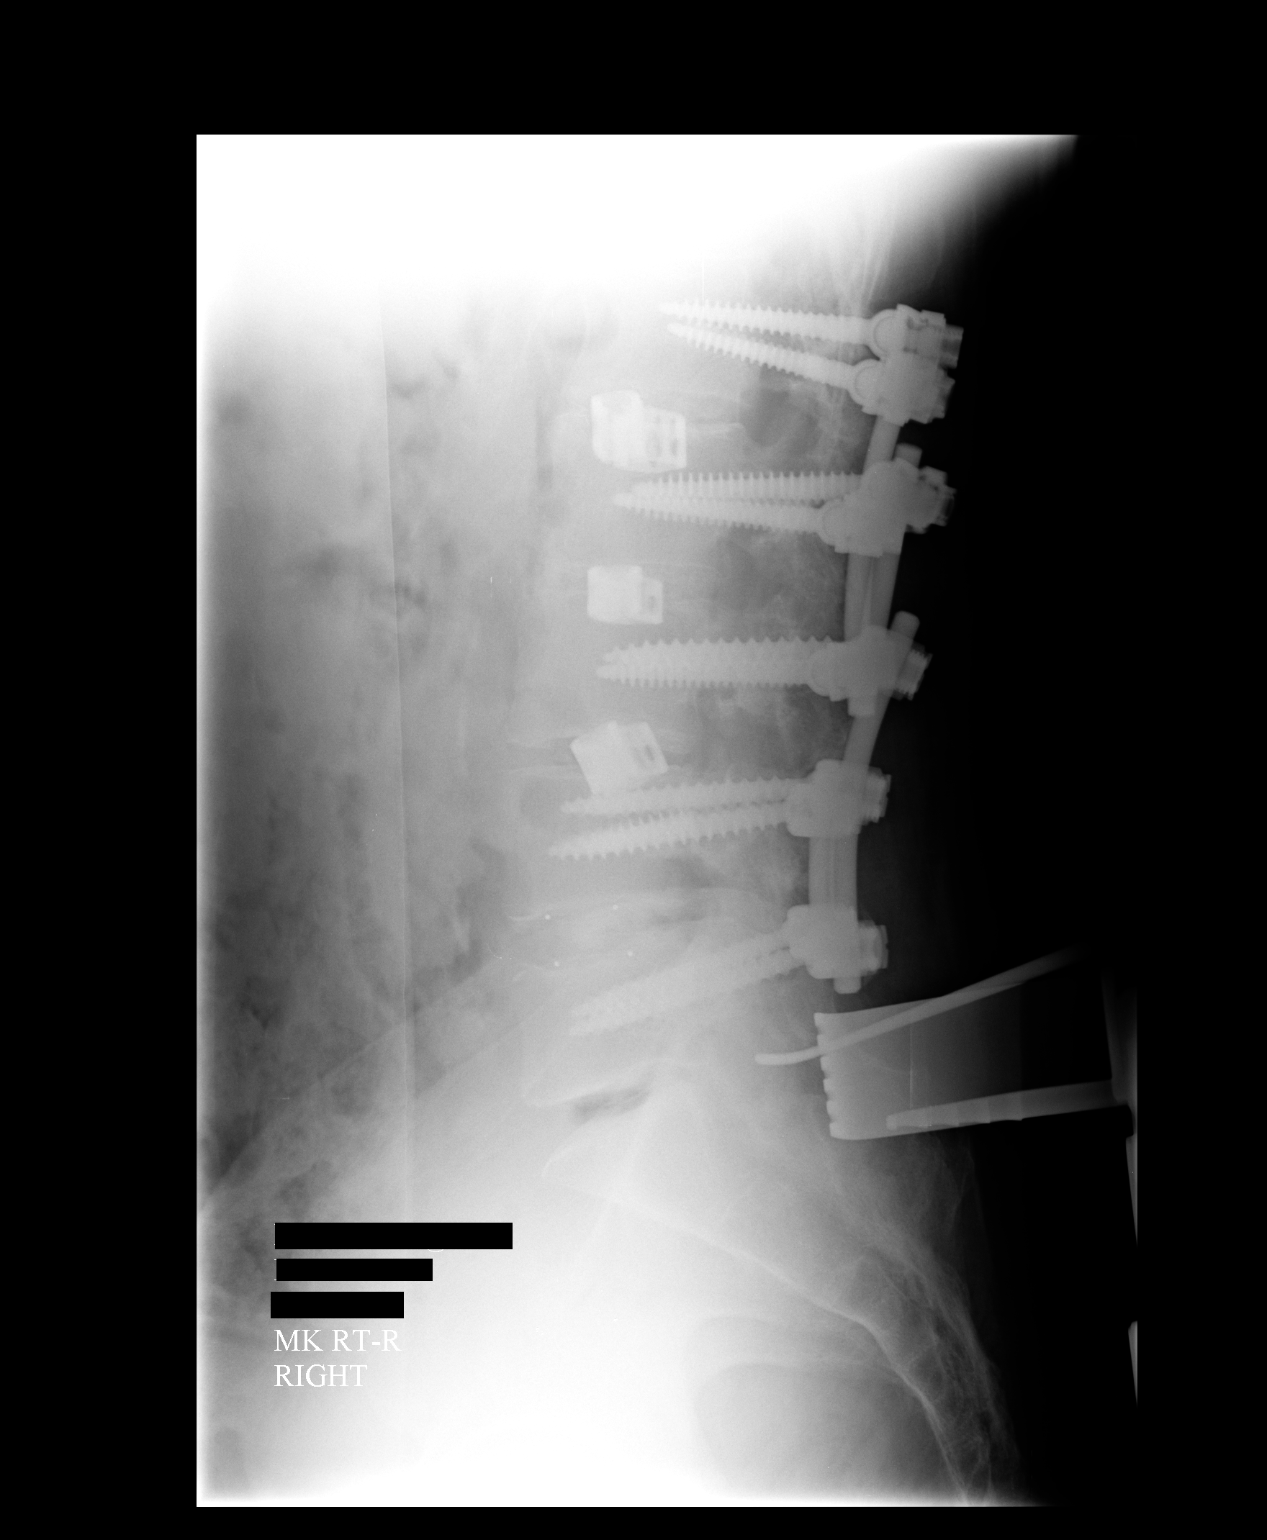

[1 of 1 positions shown; findings below may reference images not displayed]

FINDINGS: This single lateral intraoperative lumbar radiograph
demonstrates a metallic instrument projecting posterior to the L5-
S1 interspace.  Changes of instrumented PLIF L1-L5 noted.
IMPRESSION: 1.  Intraoperative localization

## 2014-02-08 ENCOUNTER — Other Ambulatory Visit: Payer: Self-pay

## 2014-04-04 ENCOUNTER — Encounter (HOSPITAL_COMMUNITY): Payer: Self-pay | Admitting: Cardiovascular Disease

## 2014-04-10 ENCOUNTER — Other Ambulatory Visit: Payer: Self-pay | Admitting: Urology

## 2014-04-10 NOTE — Progress Notes (Signed)
Called Dr. Imelda Pillow office requested orders to be released to sign and held in Epic surgery 04-06-14 pre op 04-11-14, Thanks

## 2014-04-10 NOTE — Patient Instructions (Addendum)
Dustin Carter  04/10/2014   Your procedure is scheduled on: 04/15/14   Report to [facility]  Entrance and follow signs to               Short Stay Center at 5:30 AM .  Call this number if you have problems the morning of surgery [phone]   Remember:  Do not eat food or drink liquids :After Midnight.     Take these medicines the morning of surgery with A SIP OF WATER: GABAPENTIN / METOPROLOL / PROTONIX / MAY TAKE PERCOCET IF NEEDED                              You may not have any metal on your body including hair pins and              piercings  Do not wear jewelry, make-up, lotions, powders or perfumes.             Do not wear nail polish.  Do not shave  48 hours prior to surgery.              Men may shave face and neck.   Do not bring valuables to the hospital. [facility] IS NOT             RESPONSIBLE   FOR VALUABLES.  Contacts, dentures or bridgework may not be worn into surgery.  Leave suitcase in the car. After surgery it may be brought to your room.     Patients discharged the day of surgery will not be allowed to drive home.  Name and phone number of your driver:  Special Instructions: N/A              Please read over the following fact sheets you were given: _____________________________________________________________________                                                     [facility] - PREPARING FOR SURGERY  Before surgery, you can play an important role.  Because skin is not sterile, your skin needs to be as free of germs as possible.  You can reduce the number of germs on your skin by washing with CHG (chlorahexidine gluconate) soap before surgery.  CHG is an antiseptic cleaner which kills germs and bonds with the skin to continue killing germs even after washing. Please DO NOT use if you have an allergy to CHG or antibacterial soaps.  If your skin becomes reddened/irritated stop using the CHG and inform your nurse when you  arrive at Short Stay. Do not shave (including legs and underarms) for at least 48 hours prior to the first CHG shower.  You may shave your face. Please follow these instructions carefully:   1.  Shower with CHG Soap the night before surgery and the  morning of Surgery.   2.  If you choose to wash your hair, wash your hair first as usual with your  normal  Shampoo.   3.  After you shampoo, rinse your hair and body thoroughly to remove the  shampoo.  4.  Use CHG as you would any other liquid soap.  You can apply chg directly  to the skin and wash . Gently wash with scrungie or clean wascloth    5.  Apply the CHG Soap to your body ONLY FROM THE NECK DOWN.   Do not use on open                           Wound or open sores. Avoid contact with eyes, ears mouth and genitals (private parts).                        Genitals (private parts) with your normal soap.              6.  Wash thoroughly, paying special attention to the area where your surgery  will be performed.   7.  Thoroughly rinse your body with warm water from the neck down.   8.  DO NOT shower/wash with your normal soap after using and rinsing off  the CHG Soap .                9.  Pat yourself dry with a clean towel.             10.  Wear clean pajamas.             11.  Place clean sheets on your bed the night of your first shower and do not  sleep with pets.  Day of Surgery : Do not apply any lotions/deodorants the morning of surgery.  Please wear clean clothes to the hospital/surgery center.  FAILURE TO FOLLOW THESE INSTRUCTIONS MAY RESULT IN THE CANCELLATION OF YOUR SURGERY    PATIENT SIGNATURE_________________________________  ______________________________________________________________________

## 2014-04-11 ENCOUNTER — Encounter (HOSPITAL_COMMUNITY): Payer: Self-pay

## 2014-04-11 ENCOUNTER — Encounter (HOSPITAL_COMMUNITY)
Admission: RE | Admit: 2014-04-11 | Discharge: 2014-04-11 | Disposition: A | Discharge status: Home / Self Care | Payer: Medicare Other | Source: Ambulatory Visit | Attending: Urology | Admitting: Urology

## 2014-04-11 ENCOUNTER — Ambulatory Visit (HOSPITAL_COMMUNITY)
Admission: RE | Admit: 2014-04-11 | Discharge: 2014-04-11 | Disposition: A | Discharge status: Home / Self Care | Payer: Medicare Other | Source: Ambulatory Visit | Attending: Anesthesiology | Admitting: Anesthesiology

## 2014-04-11 DIAGNOSIS — Z981 Arthrodesis status: Secondary | ICD-10-CM | POA: Insufficient documentation

## 2014-04-11 DIAGNOSIS — N200 Calculus of kidney: Secondary | ICD-10-CM | POA: Insufficient documentation

## 2014-04-11 DIAGNOSIS — Z96612 Presence of left artificial shoulder joint: Secondary | ICD-10-CM | POA: Diagnosis not present

## 2014-04-11 DIAGNOSIS — Z01818 Encounter for other preprocedural examination: Secondary | ICD-10-CM | POA: Diagnosis not present

## 2014-04-11 DIAGNOSIS — I10 Essential (primary) hypertension: Secondary | ICD-10-CM

## 2014-04-11 LAB — CBC
HCT: 40.4 % (ref 39.0–52.0)
Hemoglobin: 12.9 g/dL — ABNORMAL LOW (ref 13.0–17.0)
MCH: 28.8 pg (ref 26.0–34.0)
MCHC: 31.9 g/dL (ref 30.0–36.0)
MCV: 90.2 fL (ref 78.0–100.0)
Platelets: 186 10*3/uL (ref 150–400)
RBC: 4.48 MIL/uL (ref 4.22–5.81)
RDW: 13.5 % (ref 11.5–15.5)
WBC: 7 10*3/uL (ref 4.0–10.5)

## 2014-04-11 LAB — BASIC METABOLIC PANEL
Anion gap: 9 (ref 5–15)
BUN: 11 mg/dL (ref 6–23)
CO2: 29 mEq/L (ref 19–32)
Calcium: 9.6 mg/dL (ref 8.4–10.5)
Chloride: 101 mEq/L (ref 96–112)
Creatinine, Ser: 1.03 mg/dL (ref 0.50–1.35)
GFR calc Af Amer: 89 mL/min — ABNORMAL LOW (ref >90)
GFR calc non Af Amer: 77 mL/min — ABNORMAL LOW (ref >90)
Glucose, Bld: 106 mg/dL — ABNORMAL HIGH (ref 70–99)
Potassium: 4.8 mEq/L (ref 3.7–5.3)
Sodium: 139 mEq/L (ref 137–147)

## 2014-04-11 NOTE — Progress Notes (Signed)
   04/11/14 0949  OBSTRUCTIVE SLEEP APNEA  Have you ever been diagnosed with sleep apnea through a sleep study? No  Do you snore loudly (loud enough to be heard through closed doors)?  0  Do you often feel tired, fatigued, or sleepy during the daytime? 0  Has anyone observed you stop breathing during your sleep? 0  Do you have, or are you being treated for high blood pressure? 1  BMI more than 35 kg/m2? 0  Age over 60 years old? 1  Neck circumference greater than 40 cm/16 inches? 1  Gender: 1  Obstructive Sleep Apnea Score 4  Score 4 or greater  Results sent to PCP

## 2014-04-11 NOTE — Progress Notes (Signed)
Surgery on 04/15/14.  Need orders in EPIC.  Thank You.

## 2014-04-12 NOTE — Progress Notes (Signed)
Called office on 04/11/2014 and left Lossie Faes a message regarding no orders in EPIC.

## 2014-04-12 NOTE — Progress Notes (Signed)
Called [facility] to request Dr Patsi Sears to sign orders for surgery.

## 2014-04-15 ENCOUNTER — Ambulatory Visit (HOSPITAL_COMMUNITY)
Admission: RE | Admit: 2014-04-15 | Discharge: 2014-04-15 | Disposition: A | Discharge status: Home / Self Care | Payer: Medicare Other | Source: Ambulatory Visit | Attending: Urology | Admitting: Urology

## 2014-04-15 ENCOUNTER — Ambulatory Visit (HOSPITAL_COMMUNITY): Payer: Medicare Other | Admitting: Certified Registered Nurse Anesthetist

## 2014-04-15 ENCOUNTER — Encounter (HOSPITAL_COMMUNITY)
Admission: RE | Disposition: A | Discharge status: Home / Self Care | Payer: Self-pay | Source: Ambulatory Visit | Attending: Urology

## 2014-04-15 ENCOUNTER — Encounter (HOSPITAL_COMMUNITY): Payer: Self-pay

## 2014-04-15 ENCOUNTER — Ambulatory Visit (HOSPITAL_COMMUNITY): Payer: Medicare Other

## 2014-04-15 DIAGNOSIS — I10 Essential (primary) hypertension: Secondary | ICD-10-CM | POA: Insufficient documentation

## 2014-04-15 DIAGNOSIS — N132 Hydronephrosis with renal and ureteral calculous obstruction: Secondary | ICD-10-CM | POA: Diagnosis present

## 2014-04-15 DIAGNOSIS — I252 Old myocardial infarction: Secondary | ICD-10-CM | POA: Insufficient documentation

## 2014-04-15 DIAGNOSIS — Z87442 Personal history of urinary calculi: Secondary | ICD-10-CM | POA: Insufficient documentation

## 2014-04-15 DIAGNOSIS — Z7982 Long term (current) use of aspirin: Secondary | ICD-10-CM | POA: Diagnosis not present

## 2014-04-15 DIAGNOSIS — Z79899 Other long term (current) drug therapy: Secondary | ICD-10-CM | POA: Insufficient documentation

## 2014-04-15 DIAGNOSIS — E780 Pure hypercholesterolemia: Secondary | ICD-10-CM | POA: Insufficient documentation

## 2014-04-15 DIAGNOSIS — N401 Enlarged prostate with lower urinary tract symptoms: Secondary | ICD-10-CM | POA: Diagnosis not present

## 2014-04-15 DIAGNOSIS — Z87891 Personal history of nicotine dependence: Secondary | ICD-10-CM | POA: Diagnosis not present

## 2014-04-15 DIAGNOSIS — Z419 Encounter for procedure for purposes other than remedying health state, unspecified: Secondary | ICD-10-CM

## 2014-04-15 DIAGNOSIS — K219 Gastro-esophageal reflux disease without esophagitis: Secondary | ICD-10-CM | POA: Insufficient documentation

## 2014-04-15 HISTORY — PX: HOLMIUM LASER APPLICATION: SHX5852

## 2014-04-15 HISTORY — PX: CYSTOSCOPY/RETROGRADE/URETEROSCOPY/STONE EXTRACTION WITH BASKET: SHX5317

## 2014-04-15 SURGERY — CYSTOSCOPY, WITH CALCULUS REMOVAL USING BASKET
Anesthesia: General | Laterality: Left

## 2014-04-15 MED ORDER — MIDAZOLAM HCL 5 MG/5ML IJ SOLN
INTRAMUSCULAR | Status: DC | PRN
  Administered 2014-04-15 (×2): 1 mg via INTRAVENOUS

## 2014-04-15 MED ORDER — CEFAZOLIN SODIUM-DEXTROSE 2-3 GM-% IV SOLR
INTRAVENOUS | Status: AC
  Filled 2014-04-15: qty 50

## 2014-04-15 MED ORDER — LIDOCAINE HCL (CARDIAC) 20 MG/ML IV SOLN
INTRAVENOUS | Status: AC
  Filled 2014-04-15: qty 5

## 2014-04-15 MED ORDER — KETOROLAC TROMETHAMINE 30 MG/ML IJ SOLN
INTRAMUSCULAR | Status: AC
  Filled 2014-04-15: qty 1

## 2014-04-15 MED ORDER — ONDANSETRON HCL 4 MG/2ML IJ SOLN
INTRAMUSCULAR | Status: AC
  Filled 2014-04-15: qty 2

## 2014-04-15 MED ORDER — FENTANYL CITRATE 0.05 MG/ML IJ SOLN: 25.0000 ug | INTRAMUSCULAR | Status: DC | PRN

## 2014-04-15 MED ORDER — ACETAMINOPHEN 10 MG/ML IV SOLN
INTRAVENOUS | Status: DC | PRN
  Administered 2014-04-15: 1000 mg via INTRAVENOUS

## 2014-04-15 MED ORDER — CEFAZOLIN SODIUM-DEXTROSE 2-3 GM-% IV SOLR
INTRAVENOUS | Status: DC | PRN
  Administered 2014-04-15: 2 g via INTRAVENOUS

## 2014-04-15 MED ORDER — LACTATED RINGERS IV SOLN
INTRAVENOUS | Status: DC | PRN
  Administered 2014-04-15: 07:00:00 via INTRAVENOUS

## 2014-04-15 MED ORDER — PROMETHAZINE HCL 25 MG/ML IJ SOLN: 6.2500 mg | INTRAMUSCULAR | Status: DC | PRN

## 2014-04-15 MED ORDER — FENTANYL CITRATE 0.05 MG/ML IJ SOLN
INTRAMUSCULAR | Status: DC | PRN
  Administered 2014-04-15 (×5): 25 ug via INTRAVENOUS
  Administered 2014-04-15: 50 ug via INTRAVENOUS
  Administered 2014-04-15: 25 ug via INTRAVENOUS

## 2014-04-15 MED ORDER — PROPOFOL 10 MG/ML IV BOLUS
INTRAVENOUS | Status: AC
  Filled 2014-04-15: qty 20

## 2014-04-15 MED ORDER — IOHEXOL 300 MG/ML  SOLN
INTRAMUSCULAR | Status: DC | PRN
  Administered 2014-04-15: 5 mL

## 2014-04-15 MED ORDER — PROPOFOL 10 MG/ML IV BOLUS
INTRAVENOUS | Status: DC | PRN
  Administered 2014-04-15: 150 mg via INTRAVENOUS

## 2014-04-15 MED ORDER — KETOROLAC TROMETHAMINE 30 MG/ML IJ SOLN
INTRAMUSCULAR | Status: DC | PRN
  Administered 2014-04-15: 30 mg via INTRAVENOUS

## 2014-04-15 MED ORDER — SODIUM CHLORIDE 0.9 % IR SOLN
Status: DC | PRN
  Administered 2014-04-15: 3000 mL

## 2014-04-15 MED ORDER — FENTANYL CITRATE 0.05 MG/ML IJ SOLN
INTRAMUSCULAR | Status: AC
  Filled 2014-04-15: qty 2

## 2014-04-15 MED ORDER — LACTATED RINGERS IV SOLN: INTRAVENOUS | Status: DC

## 2014-04-15 MED ORDER — BELLADONNA ALKALOIDS-OPIUM 16.2-60 MG RE SUPP
RECTAL | Status: DC | PRN
  Administered 2014-04-15: 1 via RECTAL

## 2014-04-15 MED ORDER — LIDOCAINE HCL (CARDIAC) 20 MG/ML IV SOLN
INTRAVENOUS | Status: DC | PRN
  Administered 2014-04-15: 100 mg via INTRAVENOUS

## 2014-04-15 MED ORDER — URIBEL 118 MG PO CAPS: 1.0000 | ORAL_CAPSULE | Freq: Two times a day (BID) | ORAL | Status: DC | PRN

## 2014-04-15 MED ORDER — BELLADONNA ALKALOIDS-OPIUM 16.2-60 MG RE SUPP
RECTAL | Status: AC
  Filled 2014-04-15: qty 1

## 2014-04-15 MED ORDER — OXYCODONE-ACETAMINOPHEN 5-325 MG PO TABS: 1.0000 | ORAL_TABLET | ORAL | Status: DC | PRN

## 2014-04-15 MED ORDER — MIDAZOLAM HCL 2 MG/2ML IJ SOLN
INTRAMUSCULAR | Status: AC
  Filled 2014-04-15: qty 2

## 2014-04-15 MED ORDER — ONDANSETRON HCL 4 MG/2ML IJ SOLN
INTRAMUSCULAR | Status: DC | PRN
  Administered 2014-04-15: 4 mg via INTRAVENOUS

## 2014-04-15 SURGICAL SUPPLY — 25 items
BAG URO CATCHER STRL LF (DRAPE) ×2 IMPLANT
BASKET STNLS GEMINI 4WIRE 3FR (BASKET) IMPLANT
BASKET ZERO TIP NITINOL 2.4FR (BASKET) ×2 IMPLANT
BSKT STON RTRVL GEM 120X11 3FR (BASKET)
BSKT STON RTRVL ZERO TP 2.4FR (BASKET) ×2
CATH CLEAR GEL 3F BACKSTOP (CATHETERS) ×1 IMPLANT
CATH INTERMIT  6FR 70CM (CATHETERS) ×2 IMPLANT
CLOTH BEACON ORANGE TIMEOUT ST (SAFETY) ×2 IMPLANT
DRAPE CAMERA CLOSED 9X96 (DRAPES) ×2 IMPLANT
DRAPE LEGGING IMPERMEABLE (DRAPES) ×1 IMPLANT
FIBER LASER FLEXIVA 1000 (UROLOGICAL SUPPLIES) ×1 IMPLANT
FIBER LASER FLEXIVA 200 (UROLOGICAL SUPPLIES) ×1 IMPLANT
FIBER LASER FLEXIVA 365 (UROLOGICAL SUPPLIES) ×2 IMPLANT
FIBER LASER FLEXIVA 550 (UROLOGICAL SUPPLIES) ×1 IMPLANT
FIBER LASER TRAC TIP (UROLOGICAL SUPPLIES) ×1 IMPLANT
GLOVE BIOGEL M STRL SZ7.5 (GLOVE) ×2 IMPLANT
GOWN STRL REUS W/TWL XL LVL3 (GOWN DISPOSABLE) ×2 IMPLANT
GUIDEWIRE STR DUAL SENSOR (WIRE) ×2 IMPLANT
IV NS 1000ML (IV SOLUTION) ×2
IV NS 1000ML BAXH (IV SOLUTION) ×1 IMPLANT
MANIFOLD NEPTUNE II (INSTRUMENTS) ×2 IMPLANT
PACK CYSTO (CUSTOM PROCEDURE TRAY) ×2 IMPLANT
STENT CONTOUR 6FRX26X.038 (STENTS) ×1 IMPLANT
SYRINGE IRR TOOMEY STRL 70CC (SYRINGE) ×1 IMPLANT
TUBING CONNECTING 10 (TUBING) ×2 IMPLANT

## 2014-04-15 NOTE — Anesthesia Preprocedure Evaluation (Signed)
Anesthesia Evaluation  Patient identified by MRN, date of birth, ID band Patient awake    Reviewed: Allergy & Precautions, H&P , NPO status , Patient's Chart, lab work & pertinent test results  Airway Mallampati: II  TM Distance: >3 FB Neck ROM: Full    Dental no notable dental hx.    Pulmonary pneumonia -, resolved, former smoker,  breath sounds clear to auscultation  Pulmonary exam normal       Cardiovascular Exercise Tolerance: Good hypertension, Pt. on medications and Pt. on home beta blockers Rhythm:Regular Rate:Normal     Neuro/Psych  Headaches, negative psych ROS   GI/Hepatic Neg liver ROS, GERD-  Medicated,  Endo/Other  negative endocrine ROS  Renal/GU Renal disease  negative genitourinary   Musculoskeletal  (+) Arthritis -,   Abdominal   Peds negative pediatric ROS (+)  Hematology negative hematology ROS (+)   Anesthesia Other Findings   Reproductive/Obstetrics negative OB ROS                             Anesthesia Physical Anesthesia Plan  ASA: II  Anesthesia Plan: General   Post-op Pain Management:    Induction: Intravenous  Airway Management Planned: LMA  Additional Equipment:   Intra-op Plan:   Post-operative Plan: Extubation in OR  Informed Consent: I have reviewed the patients History and Physical, chart, labs and discussed the procedure including the risks, benefits and alternatives for the proposed anesthesia with the patient or authorized representative who has indicated his/her understanding and acceptance.   Dental advisory given  Plan Discussed with: CRNA  Anesthesia Plan Comments:         Anesthesia Quick Evaluation

## 2014-04-15 NOTE — H&P (Signed)
Reason For Visit Gross hematuria   Active Problems Problems  1. Benign prostatic hyperplasia with urinary obstruction (N40.1) 2. Bladder neck contracture (N32.0)   Assessed By: Jethro Bolus (Urology); Last Assessed: 22 Dec 2010 3. Gross hematuria (R31.0)   Assessed By: Jethro Bolus (Urology); Last Assessed: 09 Apr 2014 4. Nephrolithiasis (N20.0)   Assessed By: Jethro Bolus (Urology); Last Assessed: 09 Apr 2014  History of Present Illness    58 YO male presents today because of gross hematuria that started last Friday while out of town. This was associated with dysuria, Lt flank pain & LLQ abdominal pain, and L testis pain, since Sunday. He states that his urine has cleared up now & LLQ abd pain has subsided. He has had a previous hx of kidney stones, 2000, treated with ureteroscopy and basket extraction.      Hx of BPH, s/p CTT on 03/29/11, frequency, incomplete emptying, intermittent flow & nocturia x 1. He also has a hx of a BNC, gross hematuria & kidney stones. He has had cysto,ureteroscopy, RPG on 11/10/07. No stone was seen during procedure. Had positive NMP-22 in 2009 with negative FISH.     12/15/10 PSA - 0.53.   01/05/13: PSa - 1.16   Past Medical History Problems  1. History of Abdominal pain, RLQ (right lower quadrant) (R10.31) 2. History of Acute cystitis without hematuria (N30.00) 3. History of Acute Myocardial Infarction 4. History of Arthritis 5. History of Calculus of ureter (N20.1) 6. History of Flank Pain Right 7. History of Gross hematuria (R31.0) 8. History of esophageal reflux (Z87.19) 9. History of hypercholesterolemia (Z86.39) 10. History of hypertension (Z86.79) 11. History of kidney stones (Z87.442) 12. History of Microscopic hematuria (R31.2) 13. History of Pneumonia  Surgical History Problems  1. History of Back Surgery 2. History of Back Surgery 3. History of Cholecystectomy 4. History of Cholecystectomy 5. History of  Cystoscopy With Manipulation Of Ureteral Calculus 6. History of Cystoscopy With Ureteroscopy Right 7. History of Neck Surgery 8. Prostate Surgery  Current Meds 1. Amitriptyline HCl - 100 MG Oral Tablet;  Therapy: (Recorded:11Sep2013) to Recorded 2. Amitriptyline HCl - 25 MG Oral Tablet;  Therapy: 25Jun2012 to Recorded 3. AmLODIPine Besylate 5 MG Oral Tablet;  Therapy: 03Jun2013 to Recorded 4. Aspirin 325 MG Oral Tablet; TAKE 1 TABLET Bedtime;  Therapy: (Recorded:01Jul2009) to Recorded 5. Atorvastatin Calcium 40 MG Oral Tablet;  Therapy: 10Sep2013 to Recorded 6. Flexeril 10 MG TABS; TAKE 1 TABLET 3 TIMES DAILY AS NEEDED;  Therapy: (Recorded:01Jul2009) to Recorded 7. Gabapentin 300 MG TABS; TAKE 1 TABLET 3 TIMES DAILY;  Therapy: (Recorded:01Jul2009) to Recorded 8. Hydrocodone-Acetaminophen 7.5-325 MG Oral Tablet;  Therapy: 25Jan2013 to Recorded 9. Meloxicam 15 MG Oral Tablet;  Therapy: 29Apr2013 to Recorded 10. Oxycodone-Acetaminophen 7.5-325 MG Oral Tablet;   Therapy: 21Apr2014 to Recorded 11. Pantoprazole Sodium 40 MG Oral Tablet Delayed Release;   Therapy: 29Apr2013 to Recorded 12. Simcor 1000-20 MG Oral Tablet Extended Release 24 Hour;   Therapy: 25Aug2010 to Recorded 13. Toprol XL 50 MG Oral Tablet Extended Release 24 Hour; Take 1 tablet daily;   Therapy: (Recorded:01Jul2009) to Recorded  Allergies Medication  1. No Known Drug Allergies  Family History Problems  1. Family history of Acute Myocardial Infarction 2. Family history of Death In The Family Father   Age 19 3. Family history of Dementia : Mother 4. Family history of Family Health Status - Mother's Age   6 5. Family history of Father Deceased At Age ____   75 6. Family  history of Ischemic Stroke 7. Family history of Liver Cancer 8. Family history of Renal Failure  Social History Problems  1. Denied: History of Alcohol Use 2. Caffeine Use   4 colas/day 3. Former Smoker   Smoked X 12 years. None  X 15 years. 4. Marital History - Single 5. Occupation:   Midwife at [employer].  Review of Systems Genitourinary, constitutional, skin, eye, otolaryngeal, hematologic/lymphatic, cardiovascular, pulmonary, endocrine, musculoskeletal, gastrointestinal, neurological and psychiatric system(s) were reviewed and pertinent findings if present are noted and are otherwise negative.  Genitourinary: hematuria.    Vitals Vital Signs [Data Includes: Last 1 Day]  Recorded: 15Dec2015 03:56PM  Blood Pressure: 158 / 103 Temperature: 97.9 F Heart Rate: 87  Physical Exam Constitutional: Well nourished and well developed . No acute distress.  ENT:. The ears and nose are normal in appearance.  Neck: The appearance of the neck is normal and no neck mass is present.  Pulmonary: No respiratory distress and normal respiratory rhythm and effort.  Cardiovascular: Heart rate and rhythm are normal . No peripheral edema.  Abdomen: The abdomen is soft and nontender. No masses are palpated. No CVA tenderness. No hernias are palpable. No hepatosplenomegaly noted.  Genitourinary: Examination of the penis demonstrates no discharge, no masses, no lesions and a normal meatus. The scrotum is without lesions. The right epididymis is palpably normal and non-tender. The left epididymis is palpably normal and non-tender. The right testis is non-tender and without masses. The left testis is non-tender and without masses.  Lymphatics: The femoral and inguinal nodes are not enlarged or tender.  Skin: Normal skin turgor, no visible rash and no visible skin lesions.  Neuro/Psych:. Mood and affect are appropriate.    Results/Data Urine [Data Includes: Last 1 Day]   15Dec2015  COLOR AMBER   APPEARANCE CLOUDY   SPECIFIC GRAVITY 1.015   pH 5.5   GLUCOSE NEG mg/dL  BILIRUBIN NEG   KETONE NEG mg/dL  BLOOD LARGE   PROTEIN NEG mg/dL  UROBILINOGEN 0.2 mg/dL  NITRITE NEG   LEUKOCYTE ESTERASE NEG   SQUAMOUS EPITHELIAL/HPF NONE  SEEN   WBC 0-2 WBC/hpf  RBC 21-50 RBC/hpf  BACTERIA NONE SEEN   CRYSTALS NONE SEEN   CASTS NONE SEEN   Selected Results  AU CT-STONE PROTOCOL 15Dec2015 12:00AM Jethro Bolus   Test Name Result Flag Reference  CT-STONE PROTOCOL     Diagnostic Images Are Available In PACS For This Exam.   Assessment Assessed  1. Nephrolithiasis (N20.0) 2. Gross hematuria (R31.0) 3. Left ureteral stone (N20.1) 4. Microscopic hematuria (R31.2)  58 yo male with 40mm L mid-ueteral stone with proximal hydronephrosis, and 800 HU, He is given toradol, and flomax, and will be observed this week. I will put him on the OR schedule for Monday AM, for basket extraction, if he is unable to pass his stone.   Plan Health Maintenance  1. UA With REFLEX; [Do Not Release]; Status:Resulted - Requires Verification;   Done:  15Dec2015 03:21PM Left ureteral stone  2. Start: Oxycodone-Acetaminophen 5-325 MG Oral Tablet; TAKE 1 TABLET Every  4 hours  PRN pain Nephrolithiasis  3. Start: Tamsulosin HCl - 0.4 MG Oral Capsule; TAKE 1 CAPSULE Bedtime 4. Administered: Ketorolac Tromethamine 60 MG/2ML Injection Solution 5. AU CT-STONE PROTOCOL; Status:In Progress - Specimen/Data Collected;   Done:  15Dec2015 12:00AM  Observe this week. If unable to pass stone, then surgery Monday AM.   Signatures Electronically signed by : Jethro Bolus, M.D.; Apr 09 2014  5:06PM EST

## 2014-04-15 NOTE — Op Note (Signed)
Pre-operative diagnosis :   Impacted 1cm left mid ureteral calculus, with proximal left hydroureteronephrosis and recurrent left renal colic  Postoperative diagnosis:  Same  Operation:  Cystourethroscopy, basket extraction of prostatic stones, left retropyelogram with interpretation, left ureteroscopy, placement of left backstop above the stone, laser fragmentation of left midureteral calculus, basket extraction of impacted left mid ureteral calculus, with placement of 6 French by 26 cm left double-J stent stent (no suture).  Surgeon:  Kathie Rhodes. Patsi Sears, MD  First assistant:  None  Anesthesia:  General LMA  Preparation:  After appropriate preanesthesia, the patient was brought the operating room, placed on the operating table in the dorsal supine position where general LMA anesthesia was introduced. He was then replaced in the dorsal lithotomy position with pubis was prepped with Betadine solution and draped in usual fashion. The arm and was double checked. The history was double checked.  Review history:  Benign prostatic hyperplasia with urinary obstruction (N40.1) 2. Bladder neck contracture (N32.0)  Assessed By: Jethro Bolus (Urology); Last Assessed: 22 Dec 2010 3. Gross hematuria (R31.0)  Assessed By: Jethro Bolus (Urology); Last Assessed: 09 Apr 2014 4. Nephrolithiasis (N20.0)  Assessed By: Jethro Bolus (Urology); Last Assessed: 09 Apr 2014  History of Present Illness   58 YO male presents today because of gross hematuria that started last Friday while out of town. This was associated with dysuria, Lt flank pain & LLQ abdominal pain, and L testis pain, since Sunday. He states that his urine has cleared up now & LLQ abd pain has subsided. He has had a previous hx of kidney stones, 2000, treated with ureteroscopy and basket extraction.     Hx of BPH, s/p CTT on 03/29/11, frequency, incomplete emptying, intermittent flow & nocturia x 1. He also has a hx of a  BNC, gross hematuria & kidney stones. He has had cysto,ureteroscopy, RPG on 11/10/07. No stone was seen during procedure. Had positive NMP-22 in 2009 with negative FISH.   Statement of  Likelihood of Success: Excellent. TIME-OUT observed.:  Procedure:  Cystourethroscopy, as, and the patient was found to have pitting of the prostate, with large prostatic calculi identified to the right of the 0. These were basket extracted. Following this, cystoscopy revealed normal-appearing bladder, with mild trabeculation. There was no evidence of cellule formation. There was no diverticular formation, and no bladder tumor formation. There were no bladder stones identified.  Left retrograde polygrams performed, which showed hydroureteronephrosis, with poorly visualized left midureteral Calkins. The patient had previous lumbar spinal surgery, drawing the ureter medially at the level of the vessels. Ureteroscopy was accomplished around a large prostate, and in the mid ureter, a 1 cm impacted stone was identified. It was rounded and shape, and multifaceted, and completely stuck to the ureter. It appeared to have grown into the wall of the ureter. Guidewire was passed around the stone, and, with some difficulty, the backstop catheter was passed around the stone. Difficulty was that the stone completely obstructed the ureter, and there was very little space to maneuver. However, backstop was applied above the stone. Following this, the 325 g laser fiber was used after the stone was identified again, and using laser settings of 0.5/10, the stone was pulverized. The pieces of stone were basket extracted into the bladder. However, one large portion stone was captured in the basket, and could not be removed. The ureter was tented medialward toward the previous lumbar surgery, and the stone appeared trapped by the medial deviation, and the pelvic arteries. Therefore,  the basket was cut with the stone within it, and the ureteroscope  repassed, and the stone fragmented within the basket. The tynes of the basket were then removed, under direct vision of the ureteroscope. The fragments of stone were removed individually. Following stone extraction, a 6 [place] by 26 [place] meter double-J stent was passed and coiled in the renal pelvis, and in the bladder. Fundus, blood clot and chips of stone were evacuated from the bladder. This felt that the patient may need a Foley catheter, but I will allow him to try to void in recovery room prior to placing Foley catheter. The patient received a B&O sponsor, as well as IV Tylenol and IV Toradol. He also received IV Ancef as antibiotic coverage. He was awakened, and taken to recovery room in good condition. Following a second timeout, the stone is taken by the surgeon for analysis.

## 2014-04-15 NOTE — Discharge Instructions (Signed)
DISCHARGE INSTRUCTIONS FOR KIDNEY STONES OR URETERAL STENT ° °MEDICATIONS:  ° °1. DO NOT RESUME YOUR ASPIRIN, or any other medicines like ibuprofen, motrin, excedrin, advil, aleve, vitamin E, fish oil as these can all cause bleeding x 7 days. ° °2. Resume all your other meds from home - except do not take any other pain meds that you may have at home. ° °ACTIVITY °1. No strenuous activity x 1week °2. No driving while on narcotic pain medications °3. Drink plenty of water °4. Continue to walk at home - you can still get blood clots when you are at home, so keep active, but don't over do it. °5. May return to work in 3 days. ° °BATHING °1. You can shower and we recommend daily showers  °2. If you have a string coming from your urethra:  The stent string is attached to your ureteral stent.  Do not pull on this. ° ° °SIGNS/SYMPTOMS TO CALL: °1. Please call us if you have a fever greater than 101.5, uncontrolled  °nausea/vomiting, uncontrolled pain, dizziness, unable to urinate, bloody urine, chest pain, shortness of breath, leg swelling, leg pain, redness around wound, drainage from wound, or any other concerns or questions. ° °You can reach us at [phone]. ° °FOLLOW-UP °You have an appointment: call [phone] for appointment for stent removal.  °  You may feel an odd sensation in your back. OK for occasional blood in the urine.  °

## 2014-04-15 NOTE — Transfer of Care (Signed)
Immediate Anesthesia Transfer of Care Note  Patient: KIMSEY DEMAREE  Procedure(s) Performed: Procedure(s) (LRB): CYSTOSCOPY,  LEFT RETROGRADE PYELOGRAM,  LEFT URETEROSCOPY,  LEFT STONE EXTRACTION WITH BASKET, LEFT STENT PLACEMENT  (Left) HOLMIUM LASER APPLICATION (Left)  Patient Location: PACU  Anesthesia Type: General  Level of Consciousness: sedated, patient cooperative and responds to stimulation  Airway & Oxygen Therapy: Patient Spontanous Breathing and Patient connected to face mask oxgen  Post-op Assessment: Report given to PACU RN and Post -op Vital signs reviewed and stable  Post vital signs: Reviewed and stable  Complications: No apparent anesthesia complications

## 2014-04-15 NOTE — Interval H&P Note (Signed)
History and Physical Interval Note:  04/15/2014 7:36 AM  Dustin Carter  has presented today for surgery, with the diagnosis of LEFT MID URETERAL STONE  The various methods of treatment have been discussed with the patient and family. After consideration of risks, benefits and other options for treatment, the patient has consented to  Procedure(s): CYSTOSCOPY,  LEFT RETROGRADE PYELOGRAM,  LEFT URETEROSCOPY,  LEFT STONE EXTRACTION WITH BASKET, LEFT STENT PLACEMENT  (Left) HOLMIUM LASER APPLICATION (Left) as a surgical intervention .  The patient's history has been reviewed, patient examined, no change in status, stable for surgery.  I have reviewed the patient's chart and labs.  Questions were answered to the patient's satisfaction.     Jethro Bolus I

## 2014-04-15 NOTE — Anesthesia Postprocedure Evaluation (Signed)
  Anesthesia Post-op Note  Patient: Dustin Carter  Procedure(s) Performed: Procedure(s) (LRB): CYSTOSCOPY,  LEFT RETROGRADE PYELOGRAM,  LEFT URETEROSCOPY,  LEFT STONE EXTRACTION WITH BASKET, LEFT STENT PLACEMENT  (Left) HOLMIUM LASER APPLICATION (Left)  Patient Location: PACU  Anesthesia Type: General  Level of Consciousness: awake and alert   Airway and Oxygen Therapy: Patient Spontanous Breathing  Post-op Pain: mild  Post-op Assessment: Post-op Vital signs reviewed, Patient's Cardiovascular Status Stable, Respiratory Function Stable, Patent Airway and No signs of Nausea or vomiting  Last Vitals:  Filed Vitals:   04/15/14 1055  BP: 140/86  Pulse: 72  Temp:   Resp: 16    Post-op Vital Signs: stable   Complications: No apparent anesthesia complications

## 2014-04-16 ENCOUNTER — Encounter (HOSPITAL_COMMUNITY): Payer: Self-pay | Admitting: Urology

## 2014-05-06 DIAGNOSIS — Z125 Encounter for screening for malignant neoplasm of prostate: Secondary | ICD-10-CM | POA: Diagnosis not present

## 2014-05-06 DIAGNOSIS — Z79899 Other long term (current) drug therapy: Secondary | ICD-10-CM | POA: Diagnosis not present

## 2014-05-06 DIAGNOSIS — I10 Essential (primary) hypertension: Secondary | ICD-10-CM | POA: Diagnosis not present

## 2014-05-06 DIAGNOSIS — E789 Disorder of lipoprotein metabolism, unspecified: Secondary | ICD-10-CM | POA: Diagnosis not present

## 2014-05-14 DIAGNOSIS — E789 Disorder of lipoprotein metabolism, unspecified: Secondary | ICD-10-CM | POA: Diagnosis not present

## 2014-05-14 DIAGNOSIS — N400 Enlarged prostate without lower urinary tract symptoms: Secondary | ICD-10-CM | POA: Diagnosis not present

## 2014-05-14 DIAGNOSIS — M549 Dorsalgia, unspecified: Secondary | ICD-10-CM | POA: Diagnosis not present

## 2014-05-14 DIAGNOSIS — I10 Essential (primary) hypertension: Secondary | ICD-10-CM | POA: Diagnosis not present

## 2014-05-22 DIAGNOSIS — M5416 Radiculopathy, lumbar region: Secondary | ICD-10-CM | POA: Diagnosis not present

## 2014-05-22 DIAGNOSIS — M542 Cervicalgia: Secondary | ICD-10-CM | POA: Diagnosis not present

## 2014-05-22 DIAGNOSIS — M545 Low back pain: Secondary | ICD-10-CM | POA: Diagnosis not present

## 2014-05-22 DIAGNOSIS — M62838 Other muscle spasm: Secondary | ICD-10-CM | POA: Diagnosis not present

## 2014-05-22 DIAGNOSIS — M5412 Radiculopathy, cervical region: Secondary | ICD-10-CM | POA: Diagnosis not present

## 2014-08-14 DIAGNOSIS — M542 Cervicalgia: Secondary | ICD-10-CM | POA: Diagnosis not present

## 2014-08-14 DIAGNOSIS — M545 Low back pain: Secondary | ICD-10-CM | POA: Diagnosis not present

## 2014-08-14 DIAGNOSIS — M5416 Radiculopathy, lumbar region: Secondary | ICD-10-CM | POA: Diagnosis not present

## 2014-08-14 DIAGNOSIS — M62838 Other muscle spasm: Secondary | ICD-10-CM | POA: Diagnosis not present

## 2014-08-14 DIAGNOSIS — M5412 Radiculopathy, cervical region: Secondary | ICD-10-CM | POA: Diagnosis not present

## 2014-08-27 DIAGNOSIS — M5416 Radiculopathy, lumbar region: Secondary | ICD-10-CM | POA: Diagnosis not present

## 2014-09-25 DIAGNOSIS — M545 Low back pain: Secondary | ICD-10-CM | POA: Diagnosis not present

## 2014-09-25 DIAGNOSIS — M542 Cervicalgia: Secondary | ICD-10-CM | POA: Diagnosis not present

## 2014-09-25 DIAGNOSIS — M5412 Radiculopathy, cervical region: Secondary | ICD-10-CM | POA: Diagnosis not present

## 2014-09-25 DIAGNOSIS — M5416 Radiculopathy, lumbar region: Secondary | ICD-10-CM | POA: Diagnosis not present

## 2014-09-25 DIAGNOSIS — M62838 Other muscle spasm: Secondary | ICD-10-CM | POA: Diagnosis not present

## 2014-11-21 ENCOUNTER — Encounter: Payer: Self-pay | Admitting: *Deleted

## 2014-12-18 ENCOUNTER — Encounter: Payer: Self-pay | Admitting: Cardiovascular Disease

## 2015-02-27 DIAGNOSIS — M545 Low back pain: Secondary | ICD-10-CM | POA: Diagnosis not present

## 2015-02-27 DIAGNOSIS — M62838 Other muscle spasm: Secondary | ICD-10-CM | POA: Diagnosis not present

## 2015-02-27 DIAGNOSIS — M5412 Radiculopathy, cervical region: Secondary | ICD-10-CM | POA: Diagnosis not present

## 2015-02-27 DIAGNOSIS — M542 Cervicalgia: Secondary | ICD-10-CM | POA: Diagnosis not present

## 2015-06-02 DIAGNOSIS — Z79899 Other long term (current) drug therapy: Secondary | ICD-10-CM | POA: Diagnosis not present

## 2015-06-02 DIAGNOSIS — E789 Disorder of lipoprotein metabolism, unspecified: Secondary | ICD-10-CM | POA: Diagnosis not present

## 2015-06-05 DIAGNOSIS — E789 Disorder of lipoprotein metabolism, unspecified: Secondary | ICD-10-CM | POA: Diagnosis not present

## 2015-06-05 DIAGNOSIS — M549 Dorsalgia, unspecified: Secondary | ICD-10-CM | POA: Diagnosis not present

## 2015-06-05 DIAGNOSIS — I10 Essential (primary) hypertension: Secondary | ICD-10-CM | POA: Diagnosis not present

## 2015-06-12 DIAGNOSIS — M62838 Other muscle spasm: Secondary | ICD-10-CM | POA: Diagnosis not present

## 2015-06-12 DIAGNOSIS — M545 Low back pain: Secondary | ICD-10-CM | POA: Diagnosis not present

## 2015-06-12 DIAGNOSIS — M5412 Radiculopathy, cervical region: Secondary | ICD-10-CM | POA: Diagnosis not present

## 2015-06-12 DIAGNOSIS — M5416 Radiculopathy, lumbar region: Secondary | ICD-10-CM | POA: Diagnosis not present

## 2015-06-12 DIAGNOSIS — M542 Cervicalgia: Secondary | ICD-10-CM | POA: Diagnosis not present

## 2015-07-17 DIAGNOSIS — Z0000 Encounter for general adult medical examination without abnormal findings: Secondary | ICD-10-CM | POA: Diagnosis not present

## 2015-07-17 DIAGNOSIS — Z125 Encounter for screening for malignant neoplasm of prostate: Secondary | ICD-10-CM | POA: Diagnosis not present

## 2015-07-17 DIAGNOSIS — E789 Disorder of lipoprotein metabolism, unspecified: Secondary | ICD-10-CM | POA: Diagnosis not present

## 2015-07-17 DIAGNOSIS — I10 Essential (primary) hypertension: Secondary | ICD-10-CM | POA: Diagnosis not present

## 2015-07-24 DIAGNOSIS — I10 Essential (primary) hypertension: Secondary | ICD-10-CM | POA: Diagnosis not present

## 2015-07-24 DIAGNOSIS — M549 Dorsalgia, unspecified: Secondary | ICD-10-CM | POA: Diagnosis not present

## 2015-07-24 DIAGNOSIS — E789 Disorder of lipoprotein metabolism, unspecified: Secondary | ICD-10-CM | POA: Diagnosis not present

## 2015-07-24 DIAGNOSIS — R030 Elevated blood-pressure reading, without diagnosis of hypertension: Secondary | ICD-10-CM | POA: Diagnosis not present

## 2015-07-24 DIAGNOSIS — K219 Gastro-esophageal reflux disease without esophagitis: Secondary | ICD-10-CM | POA: Diagnosis not present

## 2015-09-04 DIAGNOSIS — Z6825 Body mass index (BMI) 25.0-25.9, adult: Secondary | ICD-10-CM | POA: Diagnosis not present

## 2015-09-04 DIAGNOSIS — M542 Cervicalgia: Secondary | ICD-10-CM | POA: Diagnosis not present

## 2015-09-04 DIAGNOSIS — M5412 Radiculopathy, cervical region: Secondary | ICD-10-CM | POA: Diagnosis not present

## 2015-09-06 DIAGNOSIS — M5412 Radiculopathy, cervical region: Secondary | ICD-10-CM | POA: Diagnosis not present

## 2015-09-06 DIAGNOSIS — M50221 Other cervical disc displacement at C4-C5 level: Secondary | ICD-10-CM | POA: Diagnosis not present

## 2015-10-13 DIAGNOSIS — M79674 Pain in right toe(s): Secondary | ICD-10-CM | POA: Diagnosis not present

## 2015-10-13 DIAGNOSIS — L02611 Cutaneous abscess of right foot: Secondary | ICD-10-CM | POA: Diagnosis not present

## 2015-10-13 DIAGNOSIS — L089 Local infection of the skin and subcutaneous tissue, unspecified: Secondary | ICD-10-CM | POA: Diagnosis not present

## 2015-10-13 DIAGNOSIS — S90424A Blister (nonthermal), right lesser toe(s), initial encounter: Secondary | ICD-10-CM | POA: Diagnosis not present

## 2015-11-14 ENCOUNTER — Encounter (HOSPITAL_BASED_OUTPATIENT_CLINIC_OR_DEPARTMENT_OTHER): Payer: Medicare Other | Attending: Internal Medicine

## 2015-11-14 DIAGNOSIS — I10 Essential (primary) hypertension: Secondary | ICD-10-CM | POA: Insufficient documentation

## 2015-11-14 DIAGNOSIS — Z96619 Presence of unspecified artificial shoulder joint: Secondary | ICD-10-CM | POA: Insufficient documentation

## 2015-11-14 DIAGNOSIS — L97511 Non-pressure chronic ulcer of other part of right foot limited to breakdown of skin: Secondary | ICD-10-CM | POA: Insufficient documentation

## 2015-11-14 DIAGNOSIS — Z87891 Personal history of nicotine dependence: Secondary | ICD-10-CM | POA: Insufficient documentation

## 2015-11-14 DIAGNOSIS — M069 Rheumatoid arthritis, unspecified: Secondary | ICD-10-CM | POA: Insufficient documentation

## 2015-11-24 DIAGNOSIS — L97511 Non-pressure chronic ulcer of other part of right foot limited to breakdown of skin: Secondary | ICD-10-CM | POA: Diagnosis not present

## 2015-12-01 ENCOUNTER — Encounter (HOSPITAL_BASED_OUTPATIENT_CLINIC_OR_DEPARTMENT_OTHER): Payer: Medicare Other | Attending: Internal Medicine

## 2015-12-01 DIAGNOSIS — L97511 Non-pressure chronic ulcer of other part of right foot limited to breakdown of skin: Secondary | ICD-10-CM | POA: Insufficient documentation

## 2015-12-01 DIAGNOSIS — M2041 Other hammer toe(s) (acquired), right foot: Secondary | ICD-10-CM | POA: Diagnosis not present

## 2015-12-01 DIAGNOSIS — Z87891 Personal history of nicotine dependence: Secondary | ICD-10-CM | POA: Diagnosis not present

## 2015-12-01 DIAGNOSIS — G629 Polyneuropathy, unspecified: Secondary | ICD-10-CM | POA: Insufficient documentation

## 2015-12-01 DIAGNOSIS — M069 Rheumatoid arthritis, unspecified: Secondary | ICD-10-CM | POA: Insufficient documentation

## 2015-12-01 DIAGNOSIS — I10 Essential (primary) hypertension: Secondary | ICD-10-CM | POA: Diagnosis not present

## 2015-12-01 DIAGNOSIS — L03115 Cellulitis of right lower limb: Secondary | ICD-10-CM | POA: Diagnosis not present

## 2015-12-01 DIAGNOSIS — M009 Pyogenic arthritis, unspecified: Secondary | ICD-10-CM | POA: Insufficient documentation

## 2015-12-08 DIAGNOSIS — L97511 Non-pressure chronic ulcer of other part of right foot limited to breakdown of skin: Secondary | ICD-10-CM | POA: Diagnosis not present

## 2015-12-15 DIAGNOSIS — L97511 Non-pressure chronic ulcer of other part of right foot limited to breakdown of skin: Secondary | ICD-10-CM | POA: Diagnosis not present

## 2016-08-30 ENCOUNTER — Emergency Department (HOSPITAL_COMMUNITY): Payer: Medicare Other

## 2016-08-30 ENCOUNTER — Encounter (HOSPITAL_COMMUNITY): Payer: Self-pay

## 2016-08-30 ENCOUNTER — Observation Stay (HOSPITAL_COMMUNITY)
Admission: EM | Admit: 2016-08-30 | Discharge: 2016-08-31 | Disposition: A | Discharge status: Home / Self Care | Payer: Medicare Other | Attending: Cardiovascular Disease | Admitting: Cardiovascular Disease

## 2016-08-30 DIAGNOSIS — R072 Precordial pain: Secondary | ICD-10-CM

## 2016-08-30 DIAGNOSIS — I10 Essential (primary) hypertension: Secondary | ICD-10-CM | POA: Diagnosis not present

## 2016-08-30 DIAGNOSIS — Z955 Presence of coronary angioplasty implant and graft: Secondary | ICD-10-CM | POA: Insufficient documentation

## 2016-08-30 DIAGNOSIS — Z7982 Long term (current) use of aspirin: Secondary | ICD-10-CM | POA: Diagnosis not present

## 2016-08-30 DIAGNOSIS — R002 Palpitations: Secondary | ICD-10-CM | POA: Diagnosis not present

## 2016-08-30 DIAGNOSIS — R0602 Shortness of breath: Secondary | ICD-10-CM | POA: Diagnosis not present

## 2016-08-30 DIAGNOSIS — R079 Chest pain, unspecified: Secondary | ICD-10-CM

## 2016-08-30 DIAGNOSIS — Z87891 Personal history of nicotine dependence: Secondary | ICD-10-CM | POA: Diagnosis not present

## 2016-08-30 DIAGNOSIS — Z79899 Other long term (current) drug therapy: Secondary | ICD-10-CM | POA: Insufficient documentation

## 2016-08-30 DIAGNOSIS — Z96612 Presence of left artificial shoulder joint: Secondary | ICD-10-CM | POA: Insufficient documentation

## 2016-08-30 LAB — CBC
HCT: 40 % (ref 39.0–52.0)
Hemoglobin: 13.3 g/dL (ref 13.0–17.0)
MCH: 29 pg (ref 26.0–34.0)
MCHC: 33.3 g/dL (ref 30.0–36.0)
MCV: 87.3 fL (ref 78.0–100.0)
Platelets: 165 10*3/uL (ref 150–400)
RBC: 4.58 MIL/uL (ref 4.22–5.81)
RDW: 13.3 % (ref 11.5–15.5)
WBC: 6.7 10*3/uL (ref 4.0–10.5)

## 2016-08-30 LAB — BASIC METABOLIC PANEL
Anion gap: 5 (ref 5–15)
BUN: 7 mg/dL (ref 6–20)
CO2: 30 mmol/L (ref 22–32)
Calcium: 8.7 mg/dL — ABNORMAL LOW (ref 8.9–10.3)
Chloride: 104 mmol/L (ref 101–111)
Creatinine, Ser: 0.99 mg/dL (ref 0.61–1.24)
GFR calc Af Amer: >60 mL/min (ref >60)
GFR calc non Af Amer: >60 mL/min (ref >60)
Glucose, Bld: 110 mg/dL — ABNORMAL HIGH (ref 65–99)
Potassium: 4.3 mmol/L (ref 3.5–5.1)
Sodium: 139 mmol/L (ref 135–145)

## 2016-08-30 LAB — BRAIN NATRIURETIC PEPTIDE: B Natriuretic Peptide: 65.7 pg/mL (ref 0.0–100.0)

## 2016-08-30 LAB — TROPONIN I
Troponin I: <0.03 ng/mL (ref <0.03)
Troponin I: <0.03 ng/mL (ref <0.03)

## 2016-08-30 MED ORDER — OXYCODONE HCL 5 MG PO TABS
2.5000 mg | ORAL_TABLET | ORAL | Status: DC | PRN
  Administered 2016-08-31: 2.5 mg via ORAL
  Filled 2016-08-30: qty 1

## 2016-08-30 MED ORDER — ASPIRIN 325 MG PO TABS: 325.0000 mg | ORAL_TABLET | Freq: Every day | ORAL | Status: DC

## 2016-08-30 MED ORDER — OXYCODONE-ACETAMINOPHEN 7.5-325 MG PO TABS
1.0000 | ORAL_TABLET | ORAL | Status: DC | PRN
Start: 2016-08-30 — End: 2016-08-30

## 2016-08-30 MED ORDER — PANTOPRAZOLE SODIUM 40 MG PO TBEC
40.0000 mg | DELAYED_RELEASE_TABLET | Freq: Every day | ORAL | Status: DC
  Administered 2016-08-31: 40 mg via ORAL
  Filled 2016-08-30: qty 1

## 2016-08-30 MED ORDER — GABAPENTIN 300 MG PO CAPS
300.0000 mg | ORAL_CAPSULE | Freq: Three times a day (TID) | ORAL | Status: DC
  Administered 2016-08-30 – 2016-08-31 (×2): 300 mg via ORAL
  Filled 2016-08-30 (×2): qty 1

## 2016-08-30 MED ORDER — METOPROLOL SUCCINATE ER 50 MG PO TB24
75.0000 mg | ORAL_TABLET | Freq: Every morning | ORAL | Status: DC
  Administered 2016-08-31: 75 mg via ORAL
  Filled 2016-08-30: qty 1

## 2016-08-30 MED ORDER — CYCLOBENZAPRINE HCL 10 MG PO TABS
10.0000 mg | ORAL_TABLET | Freq: Every day | ORAL | Status: DC
  Administered 2016-08-30: 10 mg via ORAL
  Filled 2016-08-30: qty 1

## 2016-08-30 MED ORDER — GI COCKTAIL ~~LOC~~: 30.0000 mL | Freq: Once | ORAL | Status: DC

## 2016-08-30 MED ORDER — ENOXAPARIN SODIUM 40 MG/0.4ML ~~LOC~~ SOLN
40.0000 mg | SUBCUTANEOUS | Status: DC
  Administered 2016-08-30: 40 mg via SUBCUTANEOUS
  Filled 2016-08-30: qty 0.4

## 2016-08-30 MED ORDER — ACETAMINOPHEN 325 MG PO TABS: 650.0000 mg | ORAL_TABLET | ORAL | Status: DC | PRN

## 2016-08-30 MED ORDER — OXYCODONE-ACETAMINOPHEN 5-325 MG PO TABS: 1.0000 | ORAL_TABLET | ORAL | Status: DC | PRN

## 2016-08-30 MED ORDER — NITROGLYCERIN 0.4 MG SL SUBL
0.4000 mg | SUBLINGUAL_TABLET | SUBLINGUAL | Status: DC | PRN
  Administered 2016-08-30: 0.4 mg via SUBLINGUAL
  Filled 2016-08-30: qty 1

## 2016-08-30 MED ORDER — ONDANSETRON HCL 4 MG/2ML IJ SOLN: 4.0000 mg | Freq: Four times a day (QID) | INTRAMUSCULAR | Status: DC | PRN

## 2016-08-30 MED ORDER — AMITRIPTYLINE HCL 25 MG PO TABS
150.0000 mg | ORAL_TABLET | Freq: Every day | ORAL | Status: DC
  Administered 2016-08-30: 150 mg via ORAL
  Filled 2016-08-30: qty 6

## 2016-08-30 MED ORDER — ATORVASTATIN CALCIUM 40 MG PO TABS: 40.0000 mg | ORAL_TABLET | Freq: Every day | ORAL | Status: DC

## 2016-08-30 NOTE — ED Notes (Signed)
Attempted report x1.  Name and callback number provided.   

## 2016-08-30 NOTE — ED Triage Notes (Signed)
Pt presents via [facility] for evaluation of central chest pain with radiation to R arm starting yesterday. PT reports SOB/lightheadedness and some palpitations. Pt received 325 ASA and 1 nitro PTA. Pt AxO x4.

## 2016-08-30 NOTE — H&P (Signed)
History and Physical  Patient ID: Dustin Carter MRN: [id number], SOB: [birth year] 61 y.o. Date of Encounter: 08/30/2016, 6:48 PM  Primary Physician: Darci Needle, MD Primary Cardiologist: Dr Allyson Sabal  Chief Complaint: Chest pain  HPI: 61 y.o. male who presented to [facility] on 08/30/2016 with complaints of chest pain.  The patient complains of a proximally 2 weeks of substernal chest discomfort as well as discomfort in the left arm. He describes the feeling as a fluttering that also has a pressure at times. Symptoms have occurred after physical activity but has not consistently been related to physical exertion. He's had some fatigue and shortness of breath as well. Symptoms were worse this morning so he presented to the emergency department for further evaluation. He's been concerned because he has such a strong family history of coronary artery disease. He denies orthopnea, PND, or leg swelling. He's had no appetite change. He denies any recent flulike symptoms. He's had resting palpitations in the evening and nighttime over this same time period.  The patient's father had a heart attack in his 71s and required coronary bypass surgery at a young age. He has a brother who had bypass surgery as well. Another brother has atrial fibrillation.  Past Medical History:  Diagnosis Date  . Arthritis    SPINE AND "HEAD TO TOES"  . BPH (benign prostatic hypertrophy) Mar 29, 2011   TREATED IN DR. TANNENBAUM'S OFFICE WITH "HEAT" THERAPY TO SHRINK THE PROSTATE  . Fusion of spine of cervical region    BUT NO LIMITATIONS ROM  . Fusion of spine of lumbar region    CHRONIC BACK PAIN IF PT OVER EXERTS HIMSELF  . GERD (gastroesophageal reflux disease)   . Headache(784.0)    "younger"  . Hyperlipidemia   . Hypertension   . Kidney stones    SURGERY X 1 AND OTHER STONES PT ABLE TO PASS--NO KNOWN STONES AT THE PRESENT     Surgical History:  Past Surgical History:  Procedure Laterality Date  .  BACK SURGERY    . back surgery x5  1997-2011  . CARDIAC CATHETERIZATION  11/02/11   NORMAL CORNARY ARTERIES  . CHOLECYSTECTOMY  12/29/2011   Procedure: LAPAROSCOPIC CHOLECYSTECTOMY WITH INTRAOPERATIVE CHOLANGIOGRAM;  Surgeon: Atilano Ina, MD,FACS;  Location: [facility];  Service: General;  Laterality: N/A;  . CYSTOSCOPY/RETROGRADE/URETEROSCOPY/STONE EXTRACTION WITH BASKET Left 04/15/2014   Procedure: CYSTOSCOPY,  LEFT RETROGRADE PYELOGRAM,  LEFT URETEROSCOPY,  LEFT STONE EXTRACTION WITH BASKET, LEFT STENT PLACEMENT ;  Surgeon: Kathi Ludwig, MD;  Location: [facility];  Service: Urology;  Laterality: Left;  . HOLMIUM LASER APPLICATION Left 04/15/2014   Procedure: HOLMIUM LASER APPLICATION;  Surgeon: Kathi Ludwig, MD;  Location: [facility];  Service: Urology;  Laterality: Left;  . JOINT REPLACEMENT  12/2009   L TOTAL SHOULDER  . LEFT HEART CATHETERIZATION WITH CORONARY ANGIOGRAM N/A 11/02/2011   Procedure: LEFT HEART CATHETERIZATION WITH CORONARY ANGIOGRAM;  Surgeon: Runell Gess, MD;  Location: [facility] CATH LAB;  Service: Cardiovascular;  Laterality: N/A;  . LUMBAR LAMINECTOMY/DECOMPRESSION MICRODISCECTOMY  02/29/2012   Procedure: LUMBAR LAMINECTOMY/DECOMPRESSION MICRODISCECTOMY 1 LEVEL;  Surgeon: Clydene Fake, MD;  Location: [facility]UR[facility];  Service: Neurosurgery;  Laterality: Right;  Right Lumbar five-sacral oneDiskectomy  . neck surgery x4    . NM MYOCAR PERF WALL MOTION  10/01/2011   normal  . TONSILLECTOMY     and adnoids removed     Home Meds: Prior to Admission medications  Medication Sig Start Date End Date Taking? Authorizing Provider  amitriptyline (ELAVIL) 150 MG tablet Take 150 mg by mouth at bedtime.   Yes [provider]  aspirin 325 MG tablet Take 325 mg by mouth at bedtime.   Yes [provider]  atorvastatin (LIPITOR) 40 MG tablet Take 40 mg by mouth at bedtime.   Yes [provider]  cyclobenzaprine (FLEXERIL) 10 MG tablet Take 10 mg by mouth at  bedtime. For muscle spasms   Yes [provider]  gabapentin (NEURONTIN) 300 MG capsule Take 300 mg by mouth 3 (three) times daily.    Yes [provider]  meloxicam (MOBIC) 15 MG tablet Take 15 mg by mouth every morning.    Yes [provider]  metoprolol succinate (TOPROL-XL) 50 MG 24 hr tablet Take 75 mg by mouth every morning. Take with or immediately following a meal.   Yes [provider]  oxyCODONE-acetaminophen (PERCOCET) 7.5-325 MG per tablet Take 1 tablet by mouth every 4 (four) hours as needed for pain.   Yes [provider]  pantoprazole (PROTONIX) 40 MG tablet Take 40 mg by mouth daily.   Yes [provider]  Meth-Hyo-M Bl-Na Phos-Ph Sal (URIBEL) 118 MG CAPS Take 1 capsule (118 mg total) by mouth 3 times/day as needed-between meals & bedtime. Patient not taking: Reported on 08/30/2016 04/15/14   Jethro Bolus, MD  oxyCODONE-acetaminophen (ROXICET) 5-325 MG per tablet Take 1 tablet by mouth every 4 (four) hours as needed for severe pain. Patient not taking: Reported on 08/30/2016 04/15/14   Jethro Bolus, MD    Allergies:  Allergies  Allergen Reactions  . Other Itching    Pain patch (unsure of name)    Social History   Social History  . Marital status: Single    Spouse name: N/A  . Number of children: N/A  . Years of education: N/A   Occupational History  . Not on file.   Social History Main Topics  . Smoking status: Former Smoker    Packs/day: 1.00    Years: 12.00    Types: Cigarettes    Quit date: 04/26/1992  . Smokeless tobacco: Former Neurosurgeon    Types: Chew    Quit date: 04/26/1980     Comment: used chew for 12 years before started smoking  . Alcohol use No  . Drug use: No  . Sexual activity: No   Other Topics Concern  . Not on file   Social History Narrative  . No narrative on file     Family History  Problem Relation Age of Onset  . Heart disease Father   . Hyperlipidemia Father   . Heart  disease Maternal Grandmother   . Stroke Paternal Grandfather   . Heart disease Brother   . Hyperlipidemia Brother   . Cancer Maternal Aunt     brain  . Heart disease Maternal Uncle   . Cancer Paternal Uncle     liver  . Heart disease Brother   . Hyperlipidemia Brother   . Heart disease Maternal Uncle     Review of Systems: General: negative for chills, fever, night sweats or weight changes.  ENT: negative for rhinorrhea or epistaxis Cardiovascular: see HPI Dermatological: negative for rash Respiratory: negative for cough or wheezing GI: negative for nausea, vomiting, diarrhea, bright red blood per rectum, melena, or hematemesis GU: no hematuria, urgency, or frequency Neurologic: negative for visual changes, syncope, headache, or dizziness Heme: no easy bruising or bleeding Endo: negative for  excessive thirst, thyroid disorder, or flushing Musculoskeletal: Positive for back and shoulder pain All other systems reviewed and are otherwise negative except as noted above.  Physical Exam: Blood pressure (!) 158/95, pulse 70, temperature 98 F (36.7 C), temperature source Oral, resp. rate 15, SpO2 97 %. General: Well developed, well nourished, alert and oriented, in no acute distress. HEENT: Normocephalic, atraumatic, sclera anicteric Neck: Supple. Carotids 2+ without bruits. JVP normal Lungs: Clear bilaterally to auscultation without wheezes, rales, or rhonchi. Breathing is unlabored. Heart: RRR with normal S1 and S2. No murmurs, rubs, or gallops appreciated. Abdomen: Soft, non-tender, non-distended with normoactive bowel sounds. No hepatomegaly. No rebound/guarding. No obvious abdominal masses. Back: No CVA tenderness Msk:  Strength and tone appear normal for age. Extremities: No clubbing, cyanosis, or edema.  Distal pedal pulses are 2+ and equal bilaterally. Neuro: CNII-XII intact, moves all extremities spontaneously. Psych:  Responds to questions appropriately with a normal  affect. Skin: warm and dry without rash   Labs:   Lab Results  Component Value Date   WBC 6.7 08/30/2016   HGB 13.3 08/30/2016   HCT 40.0 08/30/2016   MCV 87.3 08/30/2016   PLT 165 08/30/2016    Recent Labs Lab 08/30/16 1437  NA 139  K 4.3  CL 104  CO2 30  BUN 7  CREATININE 0.99  CALCIUM 8.7*  GLUCOSE 110*    Recent Labs  08/30/16 1445  TROPONINI <0.03   Lab Results  Component Value Date   CHOL 80 04/02/2012   HDL 50 04/02/2012   LDLCALC 24 04/02/2012   TRIG 32 04/02/2012   No results found for: DDIMER  Radiology/Studies:  Dg Chest 2 View  Result Date: 08/30/2016 CLINICAL DATA:  Left chest pain, worsened since yesterday. Hypertension. EXAM: CHEST  2 VIEW COMPARISON:  07/24/2015 FINDINGS: Normal heart size and vascularity. Minor bibasilar scarring versus atelectasis. No focal pneumonia, collapse or consolidation. Negative for edema, effusion, or pneumothorax. Trachea is midline. Aorta is atherosclerotic. Postop changes of the lower cervical spine and left shoulder. Upper lumbar posterior fusion partially imaged. Remote cholecystectomy noted. Normal bowel gas pattern. IMPRESSION: Stable chronic and postoperative findings. No superimposed acute chest process. Thoracic aortic atherosclerosis Electronically Signed   By: Judie Petit.  Shick M.D.   On: 08/30/2016 15:53     EKG: NSR, within normal limits  CARDIAC STUDIES: Cardiac cath 2013 reviewed - normal coronary arteries  ASSESSMENT AND PLAN:  1. Chest pain with typical and atypical features: Initial troponin is negative. EKG shows no significant ischemic changes. I reviewed his cardiac catheterization study from 2013 which showed no evidence of coronary artery disease. It seems reasonable to pursue stress testing. An exercise Myoview stress test will be ordered for tomorrow unless his cardiac enzymes become abnormal. With negative enzymes and no changes on his EKG, I will not put him on IV heparin overnight. Will start on DVT  prophylactic dose Lovenox. Continue aspirin and a beta blocker. Check lipids tomorrow morning. Continue atorvastatin.  2. Heart palpitations: We'll monitor on telemetry overnight. Consider an outpatient monitor unless a specific arrhythmia is seen on his telemetry monitor while he is here in the hospital.  Signed, [name] MD 08/30/2016, 6:48 PM

## 2016-08-30 NOTE — ED Provider Notes (Signed)
[facility]-EMERGENCY DEPT Provider Note   CSN: [id number] Arrival date & time: 08/30/16  1406     History   Chief Complaint Chief Complaint  Patient presents with  . Chest Pain    HPI CASTOR Dustin Carter is a 61 y.o. male.  HPI   Pt with hx HTN, HLD, GERD, arthritis p/w palpitations, chest pain, SOB that have been progressive over the past 5-6 weeks.  States these things are worse with exertion.  Pain has had time radiated to his left arm and jaw.  Feels fatigue with any exertion.  He feels the palpitations throughout his body.  Has strong family hx cardiac disease and atrial fibrillation. Denies diaphresis, N/V, dizziness/lightheadedness, leg swelling.    Past Medical History:  Diagnosis Date  . Arthritis    SPINE AND "HEAD TO TOES"  . BPH (benign prostatic hypertrophy) Mar 29, 2011   TREATED IN DR. TANNENBAUM'S OFFICE WITH "HEAT" THERAPY TO SHRINK THE PROSTATE  . Fusion of spine of cervical region    BUT NO LIMITATIONS ROM  . Fusion of spine of lumbar region    CHRONIC BACK PAIN IF PT OVER EXERTS HIMSELF  . GERD (gastroesophageal reflux disease)   . Headache(784.0)    "younger"  . Hyperlipidemia   . Hypertension   . Kidney stones    SURGERY X 1 AND OTHER STONES PT ABLE TO PASS--NO KNOWN STONES AT THE PRESENT    Patient Active Problem List   Diagnosis Date Noted  . Chest pain at rest 08/30/2016  . HCAP (healthcare-associated pneumonia) 04/01/2012  . Sinus tachycardia 04/01/2012  . Coffee ground emesis 04/01/2012  . Dehydration 04/01/2012  . GERD (gastroesophageal reflux disease) 04/01/2012  . Chest pain 04/01/2012  . Hyperlipidemia 04/01/2012  . BPH (benign prostatic hyperplasia) 04/01/2012    Past Surgical History:  Procedure Laterality Date  . BACK SURGERY    . back surgery x5  1997-2011  . CARDIAC CATHETERIZATION  11/02/11   NORMAL CORNARY ARTERIES  . CHOLECYSTECTOMY  12/29/2011   Procedure: LAPAROSCOPIC CHOLECYSTECTOMY WITH INTRAOPERATIVE CHOLANGIOGRAM;  Surgeon: Atilano Ina, MD,FACS;  Location: [facility];  Service: General;  Laterality: N/A;  . CYSTOSCOPY/RETROGRADE/URETEROSCOPY/STONE EXTRACTION WITH BASKET Left 04/15/2014   Procedure: CYSTOSCOPY,  LEFT RETROGRADE PYELOGRAM,  LEFT URETEROSCOPY,  LEFT STONE EXTRACTION WITH BASKET, LEFT STENT PLACEMENT ;  Surgeon: Kathi Ludwig, MD;  Location: [facility];  Service: Urology;  Laterality: Left;  . HOLMIUM LASER APPLICATION Left 04/15/2014   Procedure: HOLMIUM LASER APPLICATION;  Surgeon: Kathi Ludwig, MD;  Location: [facility];  Service: Urology;  Laterality: Left;  . JOINT REPLACEMENT  12/2009   L TOTAL SHOULDER  . LEFT HEART CATHETERIZATION WITH CORONARY ANGIOGRAM N/A 11/02/2011   Procedure: LEFT HEART CATHETERIZATION WITH CORONARY ANGIOGRAM;  Surgeon: Runell Gess, MD;  Location: [facility] CATH LAB;  Service: Cardiovascular;  Laterality: N/A;  . LUMBAR LAMINECTOMY/DECOMPRESSION MICRODISCECTOMY  02/29/2012   Procedure: LUMBAR LAMINECTOMY/DECOMPRESSION MICRODISCECTOMY 1 LEVEL;  Surgeon: Clydene Fake, MD;  Location: [facility];  Service: Neurosurgery;  Laterality: Right;  Right Lumbar five-sacral oneDiskectomy  . neck surgery x4    . NM MYOCAR PERF WALL MOTION  10/01/2011   normal  . TONSILLECTOMY     and adnoids removed       Home Medications    Prior to Admission medications   Medication Sig Start Date End Date Taking? Authorizing Provider  amitriptyline (ELAVIL) 150 MG tablet Take 150 mg by mouth at bedtime.   Yes [provider]  aspirin 325 MG tablet Take 325 mg by mouth at bedtime.   Yes [provider]  atorvastatin (LIPITOR) 40 MG tablet Take 40 mg by mouth at bedtime.   Yes [provider]  cyclobenzaprine (FLEXERIL) 10 MG tablet Take 10 mg by mouth at bedtime. For muscle spasms   Yes [provider]  gabapentin (NEURONTIN) 300 MG capsule Take 300 mg by mouth 3 (three) times daily.    Yes [provider]  meloxicam (MOBIC) 15 MG tablet Take 15 mg  by mouth every morning.    Yes [provider]  metoprolol succinate (TOPROL-XL) 50 MG 24 hr tablet Take 75 mg by mouth every morning. Take with or immediately following a meal.   Yes [provider]  oxyCODONE-acetaminophen (PERCOCET) 7.5-325 MG per tablet Take 1 tablet by mouth every 4 (four) hours as needed for pain.   Yes [provider]  pantoprazole (PROTONIX) 40 MG tablet Take 40 mg by mouth daily.   Yes [provider]  Meth-Hyo-M Bl-Na Phos-Ph Sal (URIBEL) 118 MG CAPS Take 1 capsule (118 mg total) by mouth 3 times/day as needed-between meals & bedtime. Patient not taking: Reported on 08/30/2016 04/15/14   Jethro Bolus, MD  oxyCODONE-acetaminophen (ROXICET) 5-325 MG per tablet Take 1 tablet by mouth every 4 (four) hours as needed for severe pain. Patient not taking: Reported on 08/30/2016 04/15/14   Jethro Bolus, MD    Family History Family History  Problem Relation Age of Onset  . Heart disease Father   . Hyperlipidemia Father   . Heart disease Maternal Grandmother   . Stroke Paternal Grandfather   . Heart disease Brother   . Hyperlipidemia Brother   . Cancer Maternal Aunt     brain  . Heart disease Maternal Uncle   . Cancer Paternal Uncle     liver  . Heart disease Brother   . Hyperlipidemia Brother   . Heart disease Maternal Uncle     Social History Social History  Substance Use Topics  . Smoking status: Former Smoker    Packs/day: 1.00    Years: 12.00    Types: Cigarettes    Quit date: 04/26/1992  . Smokeless tobacco: Former Neurosurgeon    Types: Chew    Quit date: 04/26/1980     Comment: used chew for 12 years before started smoking  . Alcohol use No     Allergies   Other   Review of Systems Review of Systems  All other systems reviewed and are negative.    Physical Exam Updated Vital Signs BP 133/77   Pulse 75   Temp 98 F (36.7 C) (Oral)   Resp (!) 27   SpO2 99%   Physical Exam  Constitutional: He  appears well-developed and well-nourished. No distress.  HENT:  Head: Normocephalic and atraumatic.  Neck: Neck supple.  Cardiovascular: Normal rate, regular rhythm and intact distal pulses.   Pulmonary/Chest: Effort normal and breath sounds normal. No respiratory distress. He has no wheezes. He has no rales.  Abdominal: Soft. He exhibits no distension and no mass. There is no tenderness. There is no rebound and no guarding.  Musculoskeletal: He exhibits no edema.  Neurological: He is alert. He exhibits normal muscle tone.  Skin: He is not diaphoretic.  Nursing note and vitals reviewed.    ED Treatments / Results  Labs (all labs ordered are listed, but only abnormal results are displayed) Labs Reviewed  BASIC METABOLIC PANEL - Abnormal; Notable for  the following:       Result Value   Glucose, Bld 110 (*)    Calcium 8.7 (*)    All other components within normal limits  CBC  BRAIN NATRIURETIC PEPTIDE  TROPONIN I  TROPONIN I  I-STAT TROPOININ, ED    EKG  EKG Interpretation  Date/Time:  Monday Aug 30 2016 14:06:13 EDT Ventricular Rate:  79 PR Interval:    QRS Duration: 95 QT Interval:  368 QTC Calculation: 422 R Axis:   21 Text Interpretation:  Sinus rhythm Normal ECG Confirmed by Juleen China  MD, STEPHEN [phone]) on 08/30/2016 5:12:58 PM       Radiology Dg Chest 2 View  Result Date: 08/30/2016 CLINICAL DATA:  Left chest pain, worsened since yesterday. Hypertension. EXAM: CHEST  2 VIEW COMPARISON:  07/24/2015 FINDINGS: Normal heart size and vascularity. Minor bibasilar scarring versus atelectasis. No focal pneumonia, collapse or consolidation. Negative for edema, effusion, or pneumothorax. Trachea is midline. Aorta is atherosclerotic. Postop changes of the lower cervical spine and left shoulder. Upper lumbar posterior fusion partially imaged. Remote cholecystectomy noted. Normal bowel gas pattern. IMPRESSION: Stable chronic and postoperative findings. No superimposed acute chest  process. Thoracic aortic atherosclerosis Electronically Signed   By: Judie Petit.  Shick M.D.   On: 08/30/2016 15:53    Procedures Procedures (including critical care time)  Medications Ordered in ED Medications  nitroGLYCERIN (NITROSTAT) SL tablet 0.4 mg (0.4 mg Sublingual Given 08/30/16 1755)     Initial Impression / Assessment and Plan / ED Course  I have reviewed the triage vital signs and the nursing notes.  Pertinent labs & imaging results that were available during my care of the patient were reviewed by me and considered in my medical decision making (see chart for details).  Clinical Course as of Aug 30 1921  Mon Aug 30, 2016  1541 HEART score is 4  [EW]    Clinical Course User Index [EW] Trixie Dredge, [place]    Patient p/w chest pain, SOB that is exertional, worsening palpitations.  Significant family hx cardiac disease and afib.  Seen and admitted by cardiology.    Final Clinical Impressions(s) / ED Diagnoses   Final diagnoses:  Precordial pain  Palpitations    New Prescriptions New Prescriptions   No medications on file     Trixie Dredge, Cordelia Poche 08/30/16 Margretta Ditty    Raeford Razor, MD 08/31/16 2107

## 2016-08-31 ENCOUNTER — Observation Stay (HOSPITAL_BASED_OUTPATIENT_CLINIC_OR_DEPARTMENT_OTHER): Payer: Medicare Other

## 2016-08-31 ENCOUNTER — Other Ambulatory Visit: Payer: Self-pay | Admitting: Cardiology

## 2016-08-31 DIAGNOSIS — R002 Palpitations: Secondary | ICD-10-CM

## 2016-08-31 DIAGNOSIS — R072 Precordial pain: Secondary | ICD-10-CM | POA: Diagnosis not present

## 2016-08-31 DIAGNOSIS — R0602 Shortness of breath: Secondary | ICD-10-CM | POA: Diagnosis not present

## 2016-08-31 DIAGNOSIS — R079 Chest pain, unspecified: Secondary | ICD-10-CM | POA: Diagnosis not present

## 2016-08-31 DIAGNOSIS — I10 Essential (primary) hypertension: Secondary | ICD-10-CM | POA: Diagnosis not present

## 2016-08-31 DIAGNOSIS — Z79899 Other long term (current) drug therapy: Secondary | ICD-10-CM

## 2016-08-31 LAB — NM MYOCAR MULTI W/SPECT W/WALL MOTION / EF
Estimated workload: 1 METS
Exercise duration (min): 4 min
Exercise duration (sec): 41 s
LV dias vol: 101 mL (ref 62–150)
LV sys vol: 55 mL
Peak HR: 97 {beats}/min
RATE: 0.3
Rest HR: 75 {beats}/min
SDS: 3
SRS: 7
SSS: 10
TID: 1.17

## 2016-08-31 LAB — LIPID PANEL
Cholesterol: 160 mg/dL (ref 0–200)
HDL: 31 mg/dL — ABNORMAL LOW (ref >40)
LDL Cholesterol: 105 mg/dL — ABNORMAL HIGH (ref 0–99)
Total CHOL/HDL Ratio: 5.2 RATIO
Triglycerides: 119 mg/dL (ref <150)
VLDL: 24 mg/dL (ref 0–40)

## 2016-08-31 LAB — TROPONIN I
Troponin I: <0.03 ng/mL (ref <0.03)
Troponin I: <0.03 ng/mL (ref <0.03)

## 2016-08-31 LAB — HIV ANTIBODY (ROUTINE TESTING W REFLEX): HIV Screen 4th Generation wRfx: NONREACTIVE

## 2016-08-31 MED ORDER — ASPIRIN EC 81 MG PO TBEC: 81.0000 mg | DELAYED_RELEASE_TABLET | Freq: Every day | ORAL | Status: DC

## 2016-08-31 MED ORDER — LOSARTAN POTASSIUM 50 MG PO TABS
50.0000 mg | ORAL_TABLET | Freq: Every day | ORAL | Status: DC
  Administered 2016-08-31: 50 mg via ORAL
  Filled 2016-08-31: qty 1

## 2016-08-31 MED ORDER — ASPIRIN 81 MG PO TBEC: 81.0000 mg | DELAYED_RELEASE_TABLET | Freq: Every day | ORAL | Status: DC

## 2016-08-31 MED ORDER — METOPROLOL SUCCINATE ER 100 MG PO TB24: 100.0000 mg | ORAL_TABLET | Freq: Every morning | ORAL | 6 refills | Status: DC

## 2016-08-31 MED ORDER — REGADENOSON 0.4 MG/5ML IV SOLN
0.4000 mg | Freq: Once | INTRAVENOUS | Status: AC
Start: 2016-08-31 — End: 2016-08-31
  Administered 2016-08-31: 0.4 mg via INTRAVENOUS

## 2016-08-31 MED ORDER — TECHNETIUM TC 99M TETROFOSMIN IV KIT: 10.0000 | PACK | Freq: Once | INTRAVENOUS | Status: DC | PRN

## 2016-08-31 MED ORDER — REGADENOSON 0.4 MG/5ML IV SOLN
INTRAVENOUS | Status: AC
  Filled 2016-08-31: qty 5

## 2016-08-31 MED ORDER — LOSARTAN POTASSIUM 50 MG PO TABS: 50.0000 mg | ORAL_TABLET | Freq: Every day | ORAL | 6 refills | Status: DC

## 2016-08-31 MED ORDER — METOPROLOL SUCCINATE ER 100 MG PO TB24: 100.0000 mg | ORAL_TABLET | Freq: Every morning | ORAL | Status: DC

## 2016-08-31 NOTE — Discharge Summary (Signed)
Discharge Summary    Patient ID: Dustin Carter,  MRN: [id number], DOB/AGE: 1955-05-02 61 y.o.  Admit date: 08/30/2016 Discharge date: 08/31/2016  Primary Care Provider: Darci Needle Primary Cardiologist: Allyson Sabal   Discharge Diagnoses    Active Problems:   Chest pain at rest   Allergies Allergies  Allergen Reactions  . Other Itching    Pain patch (unsure of name)    Diagnostic Studies/Procedures    Lexiscan Myoview: 08/31/16   There was no ST segment deviation noted during stress.  No T wave inversion was noted during stress.  The study is normal.  This is a low risk study.   Low risk stress nuclear study with normal perfusion and mildly reduced left ventricular global systolic function. Consider nonischemic cardiomyopathy. Recommend corroboration of LVEF with an alternative method, such as echocardiography. _____________   History of Present Illness     61 y.o. male who presented to [facility] on 08/30/2016 with complaints of chest pain.  The patient complained of a proximally 2 weeks of substernal chest discomfort as well as discomfort in the left arm. He describes the feeling as a fluttering that also has a pressure at times. Symptoms have occurred after physical activity but has not consistently been related to physical exertion. He's had some fatigue and shortness of breath as well. Symptoms were worse the morning of 08/30/16 so he presented to the emergency department for further evaluation. He's been concerned because he has such a strong family history of coronary artery disease. He denied orthopnea, PND, or leg swelling. He's had no appetite change. He denied any recent flulike symptoms. He's had resting palpitations in the evening and nighttime over this same time period.  The patient's father had a heart attack in his 8s and required coronary bypass surgery at a young age. He has a brother who had bypass surgery as well. Another brother has atrial  fibrillation.  Hospital Course      He was admitted with plans to undergo Lexiscan myoview the following day. Troponin negative x3. He was monitored on telemetry overnight without any acute findings. Had a low risk lexiscan, but recommended follow up echo to assess LV function. Noted to be hypertensive, therefore losartan was added to his home medications, and metoprolol was increased from 75mg  daily to 100mg  daily.   He was seen and assessed by Dr. and determined stable for discharge home. Follow up appt in the office was arranged, along with outpatient echo and holter monitor. Will plan to recheck a BMET on Friday given the addition of ARB. Medications are listed below.  _____________  Discharge Vitals Blood pressure (!) 172/81, pulse 75, temperature 97.6 F (36.4 C), temperature source Oral, resp. rate 18, height 5\' 9"  (1.753 m), weight 177 lb 4.8 oz (80.4 kg), SpO2 100 %.  Filed Weights   08/30/16 2033 08/31/16 0612  Weight: 183 lb 6.4 oz (83.2 kg) 177 lb 4.8 oz (80.4 kg)    Labs & Radiologic Studies    CBC  Recent Labs  08/30/16 1437  WBC 6.7  HGB 13.3  HCT 40.0  MCV 87.3  PLT 165   Basic Metabolic Panel  Recent Labs  08/30/16 1437  NA 139  K 4.3  CL 104  CO2 30  GLUCOSE 110*  BUN 7  CREATININE 0.99  CALCIUM 8.7*   Liver Function Tests No results for input(s): AST, ALT, ALKPHOS, BILITOT, PROT, ALBUMIN in the last 72 hours. No results for input(s):  LIPASE, AMYLASE in the last 72 hours. Cardiac Enzymes  Recent Labs  08/30/16 1840 08/31/16 0049 08/31/16 0717  TROPONINI <0.03 <0.03 <0.03   BNP Invalid input(s): POCBNP D-Dimer No results for input(s): DDIMER in the last 72 hours. Hemoglobin A1C No results for input(s): HGBA1C in the last 72 hours. Fasting Lipid Panel  Recent Labs  08/31/16 0717  CHOL 160  HDL 31*  LDLCALC 105*  TRIG 119  CHOLHDL 5.2   Thyroid Function Tests No results for input(s): TSH, T4TOTAL, T3FREE, THYROIDAB in  the last 72 hours.  Invalid input(s): FREET3 _____________  Dg Chest 2 View  Result Date: 08/30/2016 CLINICAL DATA:  Left chest pain, worsened since yesterday. Hypertension. EXAM: CHEST  2 VIEW COMPARISON:  07/24/2015 FINDINGS: Normal heart size and vascularity. Minor bibasilar scarring versus atelectasis. No focal pneumonia, collapse or consolidation. Negative for edema, effusion, or pneumothorax. Trachea is midline. Aorta is atherosclerotic. Postop changes of the lower cervical spine and left shoulder. Upper lumbar posterior fusion partially imaged. Remote cholecystectomy noted. Normal bowel gas pattern. IMPRESSION: Stable chronic and postoperative findings. No superimposed acute chest process. Thoracic aortic atherosclerosis Electronically Signed   By: Judie Petit.  Shick M.D.   On: 08/30/2016 15:53   Nm Myocar Multi W/spect W/wall Motion / Ef  Result Date: 08/31/2016  There was no ST segment deviation noted during stress.  No T wave inversion was noted during stress.  The study is normal.  This is a low risk study.  Low risk stress nuclear study with normal perfusion and mildly reduced left ventricular global systolic function. Consider nonischemic cardiomyopathy. Recommend corroboration of LVEF with an alternative method, such as echocardiography.   Disposition   Pt is being discharged home today in good condition.  Follow-up Plans & Appointments    Follow-up Information    Runell Gess, MD Follow up on 09/24/2016.   Specialties:  Cardiology, Radiology Why:  at 8:40am for your follow up appt. Contact information: [address] [place] [address] [phone]        [facility] Office Follow up on 09/10/2016.   Specialty:  Cardiology Why:  at 2pm for your echocardiogram.  Contact information: [address] [place] [address] [phone]       [facility] Street Follow up on 09/01/2016.   Why:  at 3pm to pick up your  heart monitor.        [facility] [address] Follow up on 09/03/2016.   Why:  Please come in for labs.          Discharge Instructions    Diet - low sodium heart healthy    Complete by:  As directed    Discharge instructions    Complete by:  As directed    Please keep a log of your blood pressures at home and bring to your follow up visit as we adjusted your home medications this admission. You have outpatient appts arranged for a holter monitor and echocardiogram. Please come to the office on Friday to have your labs checked.   Increase activity slowly    Complete by:  As directed       Discharge Medications   Current Discharge Medication List    START taking these medications   Details  aspirin EC 81 MG EC tablet Take 1 tablet (81 mg total) by mouth at bedtime.    losartan (COZAAR) 50 MG tablet Take 1 tablet (50 mg total) by mouth daily. Qty:  30 tablet, Refills: 6      CONTINUE these medications which have CHANGED   Details  metoprolol succinate (TOPROL-XL) 100 MG 24 hr tablet Take 1 tablet (100 mg total) by mouth every morning. Take with or immediately following a meal. Qty: 30 tablet, Refills: 6      CONTINUE these medications which have NOT CHANGED   Details  amitriptyline (ELAVIL) 150 MG tablet Take 150 mg by mouth at bedtime.    atorvastatin (LIPITOR) 40 MG tablet Take 40 mg by mouth at bedtime.    cyclobenzaprine (FLEXERIL) 10 MG tablet Take 10 mg by mouth at bedtime. For muscle spasms    gabapentin (NEURONTIN) 300 MG capsule Take 300 mg by mouth 3 (three) times daily.     meloxicam (MOBIC) 15 MG tablet Take 15 mg by mouth every morning.     oxyCODONE-acetaminophen (PERCOCET) 7.5-325 MG per tablet Take 1 tablet by mouth every 4 (four) hours as needed for pain.    pantoprazole (PROTONIX) 40 MG tablet Take 40 mg by mouth daily.      STOP taking these medications     aspirin 325 MG tablet      Meth-Hyo-M Bl-Na Phos-Ph Sal (URIBEL) 118 MG CAPS       oxyCODONE-acetaminophen (ROXICET) 5-325 MG per tablet          Outstanding Labs/Studies   BMET 09/03/16, outpatient echo and Holter monitor.   Duration of Discharge Encounter   Greater than 30 minutes including physician time.  Signed, Laverda Page NP-C 08/31/2016, 3:58 PM

## 2016-08-31 NOTE — Plan of Care (Signed)
Problem: Pain Managment: Goal: General experience of comfort will improve Outcome: Progressing Patient is able to rate his pain and describe it appropriately and at this time is pain-free, will continue to monitor.

## 2016-08-31 NOTE — Progress Notes (Signed)
Progress Note  Patient Name: Dustin Carter Date of Encounter: 08/31/2016  Primary Cardiologist: Dr. Allyson Sabal  Subjective   Patient seen and examined after stress test. Reported some chest discomfort with test that resolved after a few minutes.   No further CP or SOB.  Inpatient Medications    Scheduled Meds: . amitriptyline  150 mg Oral QHS  . aspirin  325 mg Oral QHS  . atorvastatin  40 mg Oral QHS  . cyclobenzaprine  10 mg Oral QHS  . enoxaparin (LOVENOX) injection  40 mg Subcutaneous Q24H  . gabapentin  300 mg Oral TID  . metoprolol succinate  75 mg Oral q morning - 10a  . pantoprazole  40 mg Oral Daily  . regadenoson       Continuous Infusions:  PRN Meds: acetaminophen, nitroGLYCERIN, ondansetron (ZOFRAN) IV, oxyCODONE-acetaminophen **AND** oxyCODONE, technetium tetrofosmin   Vital Signs    Vitals:   08/31/16 0024 08/31/16 0612 08/31/16 0800 08/31/16 0859  BP: (!) 166/82 (!) 169/86 (!) 164/99 (!) 160/95  Pulse: 74 80 78 72  Resp: 15 16 18    Temp: 97.8 F (36.6 C) 98.4 F (36.9 C) 97.6 F (36.4 C)   TempSrc: Oral Oral Oral   SpO2: 96% 96% 100%   Weight:  177 lb 4.8 oz (80.4 kg)    Height:        Intake/Output Summary (Last 24 hours) at 08/31/16 0902 Last data filed at 08/31/16 0615  Gross per 24 hour  Intake              340 ml  Output              800 ml  Net             -460 ml   Filed Weights   08/30/16 2033 08/31/16 0612  Weight: 183 lb 6.4 oz (83.2 kg) 177 lb 4.8 oz (80.4 kg)    Telemetry    Irregular HR noted, no specific arrhythmia  ECG    NSR   Physical Exam   GEN: No acute distress, sitting up in chair.   Neck: No JVD Cardiac: RRR, no murmurs, rubs, or gallops.  Respiratory: Clear to auscultation bilaterally, normal effort. GI: Soft, nontender, non-distended  MS: No edema; No deformity. Neuro:  Nonfocal  Psych: Normal affect   Labs    Chemistry Recent Labs Lab 08/30/16 1437  NA 139  K 4.3  CL 104  CO2 30  GLUCOSE 110*   BUN 7  CREATININE 0.99  CALCIUM 8.7*  GFRNONAA >60  GFRAA >60  ANIONGAP 5     Hematology Recent Labs Lab 08/30/16 1437  WBC 6.7  RBC 4.58  HGB 13.3  HCT 40.0  MCV 87.3  MCH 29.0  MCHC 33.3  RDW 13.3  PLT 165    Cardiac Enzymes Recent Labs Lab 08/30/16 1445 08/30/16 1840 08/31/16 0049 08/31/16 0717  TROPONINI <0.03 <0.03 <0.03 <0.03   No results for input(s): TROPIPOC in the last 168 hours.   BNP Recent Labs Lab 08/30/16 1437  BNP 65.7     DDimer No results for input(s): DDIMER in the last 168 hours.   Radiology    Dg Chest 2 View  Result Date: 08/30/2016 CLINICAL DATA:  Left chest pain, worsened since yesterday. Hypertension. EXAM: CHEST  2 VIEW COMPARISON:  07/24/2015 FINDINGS: Normal heart size and vascularity. Minor bibasilar scarring versus atelectasis. No focal pneumonia, collapse or consolidation. Negative for edema, effusion, or pneumothorax. Trachea is midline.  Aorta is atherosclerotic. Postop changes of the lower cervical spine and left shoulder. Upper lumbar posterior fusion partially imaged. Remote cholecystectomy noted. Normal bowel gas pattern. IMPRESSION: Stable chronic and postoperative findings. No superimposed acute chest process. Thoracic aortic atherosclerosis Electronically Signed   By: Judie Petit.  Shick M.D.   On: 08/30/2016 15:53    Cardiac Studies   Cath 2013 - normal coronary arteries  Myoview 08/31/2016 - pending  Patient Profile     61 y.o. male with HTN and HLD presenting with typical and atypical features of chest pain.  Initial troponin on admit negative with non-ischemic EKG.  Assessment & Plan    # Chest Pain - Myoview this morning pending.  Troponins overnight were negative x 4.  Repeat EKG this AM with NSR.  Lipids show TC 160, HDL 31, LDL 105.  On aspirin and beta blocker plus statin. - follow up Myoview results - continue aspirin, statin, BB  #HTN - pressures have been mildly elevated - consider addition of Ace or ARB vs  increasing current meds  # HLD - continue statin  # Palpitations - telemetry with some irregular HR intervals but no obvious or specific arrhythmia.   - Attending to review tele, but consider outpatient monitor if no specific arrhythmia identified  Signed, Gwynn Burly, DO  08/31/2016, 9:02 AM    Patient seen, examined. Available data reviewed. Agree with findings, assessment, and plan as outlined by Dr Earlene Plater. Exam reveals an alert, oriented male in no distress. JVP is normal, lungs are clear, heart is regular rate and rhythm no murmur or gallop, abdomen is soft and nontender, extremities without edema. The patient has returned from nuclear medicine. He's had no further chest pain overnight or this morning. Telemetry is reviewed and demonstrates normal sinus rhythm with PACs. There is no sustained arrhythmia. Will follow-up after his nuclear scan is completed. Anticipate discharge home if nuclear scan is low risk. Will arrange a 48 hour Holter monitor to evaluate his heart palpitations. Recommend increasing metoprolol succinate 100 mg daily and adding losartan 50 mg for better blood pressure control.  [name], M.D. 08/31/2016 12:16 PM

## 2016-08-31 NOTE — Progress Notes (Signed)
Progress Note  Patient Name: Dustin Carter Date of Encounter: 08/31/2016  Primary Cardiologist: Dr. Allyson Sabal  Subjective   Seen in nuc med. Mild epigastric discomfort. No dyspnea.   Inpatient Medications    Scheduled Meds: . amitriptyline  150 mg Oral QHS  . aspirin  325 mg Oral QHS  . atorvastatin  40 mg Oral QHS  . cyclobenzaprine  10 mg Oral QHS  . enoxaparin (LOVENOX) injection  40 mg Subcutaneous Q24H  . gabapentin  300 mg Oral TID  . metoprolol succinate  75 mg Oral q morning - 10a  . pantoprazole  40 mg Oral Daily  . regadenoson       Continuous Infusions:  PRN Meds: acetaminophen, nitroGLYCERIN, ondansetron (ZOFRAN) IV, oxyCODONE-acetaminophen **AND** oxyCODONE, technetium tetrofosmin   Vital Signs    Vitals:   08/31/16 0859 08/31/16 0906 08/31/16 0908 08/31/16 0910  BP: (!) 160/95 (!) 157/86 (!) 151/91 (!) 146/96  Pulse: 72 96 90 86  Resp:      Temp:      TempSrc:      SpO2:      Weight:      Height:        Intake/Output Summary (Last 24 hours) at 08/31/16 0916 Last data filed at 08/31/16 0615  Gross per 24 hour  Intake              340 ml  Output              800 ml  Net             -460 ml   Filed Weights   08/30/16 2033 08/31/16 0612  Weight: 183 lb 6.4 oz (83.2 kg) 177 lb 4.8 oz (80.4 kg)    Telemetry    Unable to review as patient seen in nuc med  ECG    NSR at rate of 74 bpm - Personally Reviewed  Physical Exam   GEN: No acute distress.   Neck: No JVD Cardiac: RRR, no murmurs, rubs, or gallops.  Respiratory: Clear to auscultation bilaterally. GI: Soft, nontender, non-distended  MS: No edema; No deformity. Neuro:  Nonfocal  Psych: Normal affect   Labs    Chemistry Recent Labs Lab 08/30/16 1437  NA 139  K 4.3  CL 104  CO2 30  GLUCOSE 110*  BUN 7  CREATININE 0.99  CALCIUM 8.7*  GFRNONAA >60  GFRAA >60  ANIONGAP 5     Hematology Recent Labs Lab 08/30/16 1437  WBC 6.7  RBC 4.58  HGB 13.3  HCT 40.0  MCV 87.3    MCH 29.0  MCHC 33.3  RDW 13.3  PLT 165    Cardiac Enzymes Recent Labs Lab 08/30/16 1445 08/30/16 1840 08/31/16 0049 08/31/16 0717  TROPONINI <0.03 <0.03 <0.03 <0.03   No results for input(s): TROPIPOC in the last 168 hours.   BNP Recent Labs Lab 08/30/16 1437  BNP 65.7     DDimer No results for input(s): DDIMER in the last 168 hours.   Radiology    Dg Chest 2 View  Result Date: 08/30/2016 CLINICAL DATA:  Left chest pain, worsened since yesterday. Hypertension. EXAM: CHEST  2 VIEW COMPARISON:  07/24/2015 FINDINGS: Normal heart size and vascularity. Minor bibasilar scarring versus atelectasis. No focal pneumonia, collapse or consolidation. Negative for edema, effusion, or pneumothorax. Trachea is midline. Aorta is atherosclerotic. Postop changes of the lower cervical spine and left shoulder. Upper lumbar posterior fusion partially imaged. Remote cholecystectomy noted. Normal bowel gas  pattern. IMPRESSION: Stable chronic and postoperative findings. No superimposed acute chest process. Thoracic aortic atherosclerosis Electronically Signed   By: Judie Petit.  Shick M.D.   On: 08/30/2016 15:53    Cardiac Studies   Pending nuc  Patient Profile     61 y.o. with hx of HTN and HLD male presented to [facility] on 08/30/2016 with complaints of chest pain.   Assessment & Plan    1. Chest pain with typical and atypical features - Troponin negative. EKG without acute findings. Pending nuc result. Continue ASA, statin and BB.   2. Palpitations - MD to review telemetry.   3. HLD - 08/31/2016: Cholesterol 160; HDL 31; LDL Cholesterol 105; Triglycerides 119; VLDL 24. Continue statin.   4. HTN - BP minimally elevated. Consider up titration of meds.   ATTENDING: See separate note from today's evaluation.  [name] 08/31/2016 12:15 PM   Signed, [name], PA  08/31/2016, 9:16 AM

## 2016-08-31 NOTE — Care Management Note (Signed)
Case Management Note  Patient Details  Name: Dustin Carter MRN: [id number] Date of Birth: 1956-03-07  Subjective/Objective:  Pt presented for Chest Pain. Pt is from home and PTA Independent. Pt has DME: Cane, Rw, and toilet riser.                   Action/Plan: No home needs identified at this time.   Expected Discharge Date:                  Expected Discharge Plan:  Home/Self Care  In-House Referral:  NA  Discharge planning Services  CM Consult  Post Acute Care Choice:  NA Choice offered to:  NA  DME Arranged:  N/A DME Agency:  NA  HH Arranged:  NA HH Agency:  NA  Status of Service:  Completed, signed off  If discussed at Long Length of Stay Meetings, dates discussed:    Additional Comments:  Gala Lewandowsky, RN 08/31/2016, 12:35 PM

## 2016-08-31 NOTE — Care Management Obs Status (Signed)
MEDICARE OBSERVATION STATUS NOTIFICATION   Patient Details  Name: Dustin Carter MRN: [id number] Date of Birth: [birth year]   Medicare Observation Status Notification Given:  Yes    Gala Lewandowsky, RN 08/31/2016, 12:34 PM

## 2016-08-31 NOTE — Progress Notes (Signed)
Patient presented for Lexiscan. Tolerated procedure well. Pending final stress imaging result.  

## 2016-08-31 NOTE — Plan of Care (Signed)
Problem: Safety: Goal: Ability to remain free from injury will improve Outcome: Completed/Met Date Met: 08/31/16 Patient instructed on how to use the call light and how to contact RN/NT via phone numbers on the white board, all personal items within his reach and he was able to show me how to call for me and is able to verbalize understanding.

## 2016-09-01 ENCOUNTER — Other Ambulatory Visit: Payer: Self-pay | Admitting: Cardiovascular Disease

## 2016-09-01 ENCOUNTER — Ambulatory Visit (INDEPENDENT_AMBULATORY_CARE_PROVIDER_SITE_OTHER): Payer: Medicare Other

## 2016-09-01 DIAGNOSIS — R002 Palpitations: Secondary | ICD-10-CM | POA: Diagnosis not present

## 2016-09-10 ENCOUNTER — Other Ambulatory Visit: Payer: Self-pay

## 2016-09-10 ENCOUNTER — Ambulatory Visit (HOSPITAL_COMMUNITY): Payer: Medicare Other | Attending: Cardiology

## 2016-09-10 DIAGNOSIS — R002 Palpitations: Secondary | ICD-10-CM | POA: Insufficient documentation

## 2016-09-24 ENCOUNTER — Ambulatory Visit (INDEPENDENT_AMBULATORY_CARE_PROVIDER_SITE_OTHER): Payer: Medicare Other | Admitting: Cardiovascular Disease

## 2016-09-24 ENCOUNTER — Encounter: Payer: Self-pay | Admitting: Cardiovascular Disease

## 2016-09-24 DIAGNOSIS — E7800 Pure hypercholesterolemia, unspecified: Secondary | ICD-10-CM | POA: Diagnosis not present

## 2016-09-24 DIAGNOSIS — R079 Chest pain, unspecified: Secondary | ICD-10-CM | POA: Diagnosis not present

## 2016-09-24 DIAGNOSIS — R000 Tachycardia, unspecified: Secondary | ICD-10-CM

## 2016-09-24 NOTE — Patient Instructions (Signed)
Medication Instructions: Your physician recommends that you continue on your current medications as directed. Please refer to the Current Medication list given to you today.   Follow-Up: We request that you follow-up in: 12 months with an extender and in 24 months with Dr Berry  You will receive a reminder letter in the mail two months in advance. If you don't receive a letter, please call our office to schedule the follow-up appointment.  If you need a refill on your cardiac medications before your next appointment, please call your pharmacy.  

## 2016-09-24 NOTE — Assessment & Plan Note (Signed)
History of palpitations with recent Holter monitor showed sinus rhythm /sinus bradycardia with runs of PSVT. His beta blocker was recently uptitrated.

## 2016-09-24 NOTE — Assessment & Plan Note (Signed)
Acute atypical chest pain status post cardiac catheterization by myself 11/02/11 which was entirely normal. He was recently hospitalized earlier this month with palpitations and atypical chest pain. A 2-D echo and Myoview stress tests were normal as well.

## 2016-09-24 NOTE — Assessment & Plan Note (Signed)
History of hyperlipidemia on statin therapy with recent lipid profile performed on 08/31/16 revealing total cholesterol 160, LDL 105 and HDL of 31.

## 2016-09-24 NOTE — Progress Notes (Signed)
09/24/2016 Dustin Carter   September 29, 1955  [id number]  Primary Physician Dustin Needle, MD Primary Cardiologist: Dustin Gess MD Dustin Carter  HPI:  Dustin Carter is a 61 year old mildly overweight single Caucasian male children is currently on disability because of multiple back operations. On seeing him back for his post hospital visit. His only risk factor is treated hyperlipidemia and family history. He's never had a heart attack or stroke. I did perform a cardiac catheterization on him as an outpatient 11/02/11 which is entirely normal. This hospitalized from [address] of this month with atypical chest pain and palpitations. Event monitor showed sinus rhythm/sinus bradycardia runs of PSVT. His beta blocker was uptitrated. A 2-D echo was normal and a Myoview stress test was low risk.   Current Outpatient Prescriptions  Medication Sig Dispense Refill  . amitriptyline (ELAVIL) 150 MG tablet Take 150 mg by mouth at bedtime.    Marland Kitchen aspirin EC 81 MG EC tablet Take 1 tablet (81 mg total) by mouth at bedtime.    Marland Kitchen atorvastatin (LIPITOR) 40 MG tablet Take 40 mg by mouth at bedtime.    . cyclobenzaprine (FLEXERIL) 10 MG tablet Take 10 mg by mouth at bedtime. For muscle spasms    . gabapentin (NEURONTIN) 300 MG capsule Take 300 mg by mouth 3 (three) times daily.     Marland Kitchen losartan (COZAAR) 50 MG tablet Take 1 tablet (50 mg total) by mouth daily. 30 tablet 6  . meloxicam (MOBIC) 15 MG tablet Take 15 mg by mouth every morning.     . metoprolol succinate (TOPROL-XL) 100 MG 24 hr tablet Take 1 tablet (100 mg total) by mouth every morning. Take with or immediately following a meal. 30 tablet 6  . oxyCODONE-acetaminophen (PERCOCET) 7.5-325 MG per tablet Take 1 tablet by mouth every 4 (four) hours as needed for pain.    . pantoprazole (PROTONIX) 40 MG tablet Take 40 mg by mouth daily.     No current facility-administered medications for this visit.     Allergies  Allergen Reactions  . Other Itching    Pain patch (unsure of name)    Social History   Social History  . Marital status: Single    Spouse name: N/A  . Number of children: N/A  . Years of education: N/A   Occupational History  . Not on file.   Social History Main Topics  . Smoking status: Former Smoker    Packs/day: 1.00    Years: 12.00    Types: Cigarettes    Quit date: 04/26/1992  . Smokeless tobacco: Former Neurosurgeon    Types: Chew    Quit date: 04/26/1980     Comment: used chew for 12 years before started smoking  . Alcohol use No  . Drug use: No  . Sexual activity: No   Other Topics Concern  . Not on file   Social History Narrative  . No narrative on file     Review of Systems: General: negative for chills, fever, night sweats or weight changes.  Cardiovascular: negative for chest pain, dyspnea on exertion, edema, orthopnea, palpitations, paroxysmal nocturnal dyspnea or shortness of breath Dermatological: negative for rash Respiratory: negative for cough or wheezing Urologic: negative for hematuria Abdominal: negative for nausea, vomiting, diarrhea, bright red blood per rectum, melena, or hematemesis Neurologic: negative for visual changes, syncope, or dizziness All other systems reviewed and are otherwise negative except as noted above.    Blood pressure (!) 150/76, pulse 68,  height 5\' 9"  (1.753 m), weight 186 lb (84.4 kg), SpO2 98 %.  General appearance: alert and no distress Neck: no adenopathy, no carotid bruit, no JVD, supple, symmetrical, trachea midline and thyroid not enlarged, symmetric, no tenderness/mass/nodules Lungs: clear to auscultation bilaterally Heart: regular rate and rhythm, S1, S2 normal, no murmur, click, rub or gallop Extremities: extremities normal, atraumatic, no cyanosis or edema  EKG not performed today  ASSESSMENT AND PLAN:   Hyperlipidemia History of hyperlipidemia on statin therapy with recent lipid profile performed on 08/31/16 revealing total cholesterol 160, LDL 105 and  HDL of 31.  Sinus tachycardia History of palpitations with recent Holter monitor showed sinus rhythm /sinus bradycardia with runs of PSVT. His beta blocker was recently uptitrated.  Chest pain at rest Acute atypical chest pain status post cardiac catheterization by myself 11/02/11 which was entirely normal. He was recently hospitalized earlier this month with palpitations and atypical chest pain. A 2-D echo and Myoview stress tests were normal as well.      01/03/12 MD FACP,FACC,FAHA, [facility] 09/24/2016 8:50 AM

## 2016-12-08 ENCOUNTER — Telehealth: Payer: Self-pay | Admitting: Physician Assistant

## 2016-12-08 NOTE — Telephone Encounter (Signed)
Received records from [facility] for appointment on 12/24/16 with Theodore Demark, PA.  Records put with Rhonda's schedule for 12/25/79.  lp

## 2016-12-21 ENCOUNTER — Other Ambulatory Visit: Payer: Self-pay | Admitting: Urology

## 2016-12-24 ENCOUNTER — Encounter (HOSPITAL_BASED_OUTPATIENT_CLINIC_OR_DEPARTMENT_OTHER): Payer: Self-pay | Admitting: *Deleted

## 2016-12-24 ENCOUNTER — Ambulatory Visit: Payer: Medicare Other | Admitting: Physician Assistant

## 2016-12-24 NOTE — Progress Notes (Signed)
NPO AFTER MN.  ARRIVE AT 0800.  NEEDS ISTAT.  CURRENT EKG IN CHART AND EPIC.  WILL TAKE METOPROLOL, LOSARTAN, PROTONIX,AND GABAPENTIN AM DOS W/ SIPS OF WATER.

## 2016-12-29 ENCOUNTER — Ambulatory Visit (HOSPITAL_BASED_OUTPATIENT_CLINIC_OR_DEPARTMENT_OTHER): Payer: Medicare Other | Admitting: Anesthesiology

## 2016-12-29 ENCOUNTER — Ambulatory Visit (HOSPITAL_BASED_OUTPATIENT_CLINIC_OR_DEPARTMENT_OTHER)
Admission: RE | Admit: 2016-12-29 | Discharge: 2016-12-29 | Disposition: A | Discharge status: Home / Self Care | Payer: Medicare Other | Source: Ambulatory Visit | Attending: Urology | Admitting: Urology

## 2016-12-29 ENCOUNTER — Encounter (HOSPITAL_BASED_OUTPATIENT_CLINIC_OR_DEPARTMENT_OTHER): Payer: Self-pay | Admitting: Anesthesiology

## 2016-12-29 ENCOUNTER — Encounter (HOSPITAL_BASED_OUTPATIENT_CLINIC_OR_DEPARTMENT_OTHER)
Admission: RE | Disposition: A | Discharge status: Home / Self Care | Payer: Self-pay | Source: Ambulatory Visit | Attending: Urology

## 2016-12-29 DIAGNOSIS — Z87891 Personal history of nicotine dependence: Secondary | ICD-10-CM | POA: Diagnosis not present

## 2016-12-29 DIAGNOSIS — E785 Hyperlipidemia, unspecified: Secondary | ICD-10-CM | POA: Insufficient documentation

## 2016-12-29 DIAGNOSIS — N400 Enlarged prostate without lower urinary tract symptoms: Secondary | ICD-10-CM | POA: Insufficient documentation

## 2016-12-29 DIAGNOSIS — I10 Essential (primary) hypertension: Secondary | ICD-10-CM | POA: Diagnosis not present

## 2016-12-29 DIAGNOSIS — N202 Calculus of kidney with calculus of ureter: Secondary | ICD-10-CM | POA: Diagnosis present

## 2016-12-29 DIAGNOSIS — Z79899 Other long term (current) drug therapy: Secondary | ICD-10-CM | POA: Diagnosis not present

## 2016-12-29 DIAGNOSIS — N359 Urethral stricture, unspecified: Secondary | ICD-10-CM | POA: Diagnosis not present

## 2016-12-29 HISTORY — DX: Complete loss of teeth, unspecified cause, unspecified class: K08.109

## 2016-12-29 HISTORY — DX: Personal history of urinary calculi: Z87.442

## 2016-12-29 HISTORY — DX: Calculus of kidney: N20.0

## 2016-12-29 HISTORY — DX: Presence of dental prosthetic device (complete) (partial): Z97.2

## 2016-12-29 HISTORY — DX: Other chest pain: R07.89

## 2016-12-29 HISTORY — DX: Family history of other specified conditions: Z84.89

## 2016-12-29 HISTORY — DX: Tachycardia, unspecified: R00.0

## 2016-12-29 HISTORY — PX: CYSTOSCOPY/URETEROSCOPY/HOLMIUM LASER/STENT PLACEMENT: SHX6546

## 2016-12-29 HISTORY — DX: Presence of spectacles and contact lenses: Z97.3

## 2016-12-29 LAB — POCT I-STAT, CHEM 8
BUN: 13 mg/dL (ref 6–20)
Calcium, Ion: 1.23 mmol/L (ref 1.15–1.40)
Chloride: 102 mmol/L (ref 101–111)
Creatinine, Ser: 1 mg/dL (ref 0.61–1.24)
Glucose, Bld: 103 mg/dL — ABNORMAL HIGH (ref 65–99)
HCT: 36 % — ABNORMAL LOW (ref 39.0–52.0)
Hemoglobin: 12.2 g/dL — ABNORMAL LOW (ref 13.0–17.0)
Potassium: 4 mmol/L (ref 3.5–5.1)
Sodium: 141 mmol/L (ref 135–145)
TCO2: 29 mmol/L (ref 22–32)

## 2016-12-29 SURGERY — CYSTOSCOPY/URETEROSCOPY/HOLMIUM LASER/STENT PLACEMENT
Anesthesia: General | Laterality: Bilateral

## 2016-12-29 MED ORDER — DEXAMETHASONE SODIUM PHOSPHATE 10 MG/ML IJ SOLN
INTRAMUSCULAR | Status: DC | PRN
  Administered 2016-12-29: 10 mg via INTRAVENOUS

## 2016-12-29 MED ORDER — MEPERIDINE HCL 25 MG/ML IJ SOLN
6.2500 mg | INTRAMUSCULAR | Status: DC | PRN
  Filled 2016-12-29: qty 1

## 2016-12-29 MED ORDER — KETOROLAC TROMETHAMINE 30 MG/ML IJ SOLN
INTRAMUSCULAR | Status: DC | PRN
  Administered 2016-12-29: 30 mg via INTRAVENOUS

## 2016-12-29 MED ORDER — HYDROCODONE-ACETAMINOPHEN 7.5-325 MG PO TABS
1.0000 | ORAL_TABLET | Freq: Once | ORAL | Status: DC | PRN
  Filled 2016-12-29: qty 1

## 2016-12-29 MED ORDER — LIDOCAINE 2% (20 MG/ML) 5 ML SYRINGE
INTRAMUSCULAR | Status: DC | PRN
  Administered 2016-12-29: 100 mg via INTRAVENOUS

## 2016-12-29 MED ORDER — ONDANSETRON HCL 4 MG/2ML IJ SOLN
INTRAMUSCULAR | Status: AC
  Filled 2016-12-29: qty 2

## 2016-12-29 MED ORDER — PHENYLEPHRINE 40 MCG/ML (10ML) SYRINGE FOR IV PUSH (FOR BLOOD PRESSURE SUPPORT)
PREFILLED_SYRINGE | INTRAVENOUS | Status: AC
  Filled 2016-12-29: qty 10

## 2016-12-29 MED ORDER — MIDAZOLAM HCL 5 MG/5ML IJ SOLN
INTRAMUSCULAR | Status: DC | PRN
  Administered 2016-12-29: 2 mg via INTRAVENOUS

## 2016-12-29 MED ORDER — FENTANYL CITRATE (PF) 100 MCG/2ML IJ SOLN
INTRAMUSCULAR | Status: AC
  Filled 2016-12-29: qty 2

## 2016-12-29 MED ORDER — FENTANYL CITRATE (PF) 100 MCG/2ML IJ SOLN
INTRAMUSCULAR | Status: DC | PRN
  Administered 2016-12-29 (×2): 25 ug via INTRAVENOUS

## 2016-12-29 MED ORDER — LACTATED RINGERS IV SOLN
INTRAVENOUS | Status: DC
  Administered 2016-12-29: 09:00:00 via INTRAVENOUS
  Filled 2016-12-29: qty 1000

## 2016-12-29 MED ORDER — PROPOFOL 10 MG/ML IV BOLUS
INTRAVENOUS | Status: AC
  Filled 2016-12-29: qty 40

## 2016-12-29 MED ORDER — ONDANSETRON HCL 4 MG/2ML IJ SOLN
INTRAMUSCULAR | Status: DC | PRN
  Administered 2016-12-29: 4 mg via INTRAVENOUS

## 2016-12-29 MED ORDER — LIDOCAINE 2% (20 MG/ML) 5 ML SYRINGE
INTRAMUSCULAR | Status: AC
  Filled 2016-12-29: qty 5

## 2016-12-29 MED ORDER — CEFAZOLIN SODIUM-DEXTROSE 2-4 GM/100ML-% IV SOLN
2.0000 g | INTRAVENOUS | Status: AC
  Administered 2016-12-29: 2 g via INTRAVENOUS
  Filled 2016-12-29: qty 100

## 2016-12-29 MED ORDER — MIDAZOLAM HCL 2 MG/2ML IJ SOLN
INTRAMUSCULAR | Status: AC
  Filled 2016-12-29: qty 2

## 2016-12-29 MED ORDER — KETOROLAC TROMETHAMINE 30 MG/ML IJ SOLN
INTRAMUSCULAR | Status: AC
  Filled 2016-12-29: qty 1

## 2016-12-29 MED ORDER — PROMETHAZINE HCL 25 MG/ML IJ SOLN
6.2500 mg | INTRAMUSCULAR | Status: DC | PRN
Start: 2016-12-29 — End: 2016-12-29
  Filled 2016-12-29: qty 1

## 2016-12-29 MED ORDER — HYDROMORPHONE HCL-NACL 0.5-0.9 MG/ML-% IV SOSY
PREFILLED_SYRINGE | INTRAVENOUS | Status: AC
  Filled 2016-12-29: qty 1

## 2016-12-29 MED ORDER — PROPOFOL 10 MG/ML IV BOLUS
INTRAVENOUS | Status: DC | PRN
  Administered 2016-12-29: 200 mg via INTRAVENOUS

## 2016-12-29 MED ORDER — CEFAZOLIN SODIUM-DEXTROSE 2-4 GM/100ML-% IV SOLN
INTRAVENOUS | Status: AC
  Filled 2016-12-29: qty 100

## 2016-12-29 MED ORDER — HYDROMORPHONE HCL 1 MG/ML IJ SOLN
0.2500 mg | INTRAMUSCULAR | Status: DC | PRN
  Administered 2016-12-29: 0.25 mg via INTRAVENOUS
  Filled 2016-12-29: qty 0.5

## 2016-12-29 MED ORDER — DEXAMETHASONE SODIUM PHOSPHATE 10 MG/ML IJ SOLN
INTRAMUSCULAR | Status: AC
  Filled 2016-12-29: qty 1

## 2016-12-29 SURGICAL SUPPLY — 25 items
BAG DRAIN URO-CYSTO SKYTR STRL (DRAIN) ×2 IMPLANT
BAG DRN UROCATH (DRAIN) ×1
BASKET LASER NITINOL 1.9FR (BASKET) ×2 IMPLANT
BSKT STON RTRVL 120 1.9FR (BASKET) ×1
CATH INTERMIT  6FR 70CM (CATHETERS) IMPLANT
CLOTH BEACON ORANGE TIMEOUT ST (SAFETY) ×3 IMPLANT
FIBER LASER FLEXIVA 365 (UROLOGICAL SUPPLIES) IMPLANT
FIBER LASER TRAC TIP (UROLOGICAL SUPPLIES) IMPLANT
GOWN STRL REUS W/TWL LRG LVL3 (GOWN DISPOSABLE) ×2 IMPLANT
GOWN STRL REUS W/TWL XL LVL3 (GOWN DISPOSABLE) ×1 IMPLANT
GUIDEWIRE ANG ZIPWIRE 038X150 (WIRE) ×1 IMPLANT
GUIDEWIRE STR DUAL SENSOR (WIRE) ×3 IMPLANT
INFUSOR MANOMETER BAG 3000ML (MISCELLANEOUS) ×2 IMPLANT
IV NS 1000ML (IV SOLUTION) ×2
IV NS 1000ML BAXH (IV SOLUTION) ×1 IMPLANT
IV NS IRRIG 3000ML ARTHROMATIC (IV SOLUTION) ×2 IMPLANT
KIT RM TURNOVER CYSTO AR (KITS) ×2 IMPLANT
MANIFOLD NEPTUNE II (INSTRUMENTS) ×2 IMPLANT
NS IRRIG 500ML POUR BTL (IV SOLUTION) ×2 IMPLANT
PACK CYSTO (CUSTOM PROCEDURE TRAY) ×2 IMPLANT
SHEATH ACCESS URETERAL 38CM (SHEATH) ×1 IMPLANT
STENT URET 6FRX26 CONTOUR (STENTS) ×2 IMPLANT
SYRINGE 10CC LL (SYRINGE) ×2 IMPLANT
TUBE CONNECTING 12X1/4 (SUCTIONS) IMPLANT
TUBE FEEDING 8FR 16IN STR KANG (MISCELLANEOUS) ×2 IMPLANT

## 2016-12-29 NOTE — Discharge Instructions (Addendum)
You had a ureteroscopy with laser lithotripsy and ureteral stent placement.  You may notice some blood in the urine.  This is okay as long as it is not severe and youre able to urinate (or if your catheter is draining if you have a Foley catheter).  Some people also experienced discomfort and a great sense of urinary urgency with a stent in place.  To help with this, make sure to take Flomax and oxybutynin as directed. You may also take pain medication as needed for discomfort from the stent. Call or go to the emergency department should you experience severe pain or fever greater than 101.  You have, cyclobenzaprine, listed as a outpatient medication.  Please use caution when combining this with Vicodin.  Vicodin combined with this can cause respiratory depression.  Start using ibuprofen first as needed for pain and only use of Vicodin for severe pain refractory to the ibuprofen.  Post Anesthesia Home Care Instructions  Activity: Get plenty of rest for the remainder of the day. A responsible individual must stay with you for 24 hours following the procedure.  For the next 24 hours, DO NOT: -Drive a car -Advertising copywriter -Drink alcoholic beverages -Take any medication unless instructed by your physician -Make any legal decisions or sign important papers.  Meals: Start with liquid foods such as gelatin or soup. Progress to regular foods as tolerated. Avoid greasy, spicy, heavy foods. If nausea and/or vomiting occur, drink only clear liquids until the nausea and/or vomiting subsides. Call your physician if vomiting continues.  Special Instructions/Symptoms: Your throat may feel dry or sore from the anesthesia or the breathing tube placed in your throat during surgery. If this causes discomfort, gargle with warm salt water. The discomfort should disappear within 24 hours.  If you had a scopolamine patch placed behind your ear for the management of post- operative nausea and/or vomiting:  1. The  medication in the patch is effective for 72 hours, after which it should be removed.  Wrap patch in a tissue and discard in the trash. Wash hands thoroughly with soap and water. 2. You may remove the patch earlier than 72 hours if you experience unpleasant side effects which may include dry mouth, dizziness or visual disturbances. 3. Avoid touching the patch. Wash your hands with soap and water after contact with the patch.

## 2016-12-29 NOTE — Anesthesia Preprocedure Evaluation (Signed)
Anesthesia Evaluation  Patient identified by MRN, date of birth, ID band Patient awake    Reviewed: Allergy & Precautions, NPO status , Patient's Chart, lab work & pertinent test results, reviewed documented beta blocker date and time   History of Anesthesia Complications (+) Family history of anesthesia reaction  Airway Mallampati: III  TM Distance: >3 FB Neck ROM: Full    Dental  (+) Edentulous Upper, Edentulous Lower   Pulmonary pneumonia, resolved, former smoker,    Pulmonary exam normal breath sounds clear to auscultation       Cardiovascular hypertension, Pt. on home beta blockers and Pt. on medications + DOE  Normal cardiovascular exam Rhythm:Regular Rate:Normal     Neuro/Psych  Neuromuscular disease negative psych ROS   GI/Hepatic GERD  Medicated and Controlled,  Endo/Other  Hyperlipidemia  Renal/GU Renal diseaseBilateral renal calculi   BPH    Musculoskeletal  (+) Arthritis , Osteoarthritis,    Abdominal   Peds  Hematology negative hematology ROS (+)   Anesthesia Other Findings   Reproductive/Obstetrics                             Anesthesia Physical Anesthesia Plan  ASA: II  Anesthesia Plan: General   Post-op Pain Management:    Induction: Intravenous  PONV Risk Score and Plan: 4 or greater and Ondansetron, Dexamethasone, Midazolam, Propofol infusion, Promethazine and Treatment may vary due to age or medical condition  Airway Management Planned: LMA  Additional Equipment:   Intra-op Plan:   Post-operative Plan: Extubation in OR  Informed Consent: I have reviewed the patients History and Physical, chart, labs and discussed the procedure including the risks, benefits and alternatives for the proposed anesthesia with the patient or authorized representative who has indicated his/her understanding and acceptance.   Dental advisory given  Plan Discussed with: CRNA,  Anesthesiologist and Surgeon  Anesthesia Plan Comments:         Anesthesia Quick Evaluation

## 2016-12-29 NOTE — Transfer of Care (Signed)
Immediate Anesthesia Transfer of Care Note  Patient: Dustin Carter  Procedure(s) Performed: Procedure(s): CYSTOSCOPY RIGHT URETEROSCOPY POSSIBLE LEFT  WITH HOLMIUM LASER AND URETERAL STENT PLACEMENT (Bilateral)  Patient Location: PACU  Anesthesia Type:General  Level of Consciousness: obtunded  Airway & Oxygen Therapy: Patient Spontanous Breathing and Patient connected to nasal cannula oxygen, requiring jaw lift  Post-op Assessment: Report given to RN  Post vital signs: Reviewed and stable  Last Vitals: 105/67, 69, 19, 94, 97.4  Vitals:   12/29/16 0727  BP: 125/71  Pulse: 69  Resp: 18  Temp: 36.4 C  SpO2: 100%    Last Pain:  Vitals:   12/29/16 0727  TempSrc: Oral      Patients Stated Pain Goal: 6 (12/29/16 0836)  Complications: No apparent anesthesia complications

## 2016-12-29 NOTE — Anesthesia Postprocedure Evaluation (Signed)
Anesthesia Post Note  Patient: MAR WALMER  Procedure(s) Performed: Procedure(s) (LRB): CYSTOSCOPY RIGHT URETEROSCOPY POSSIBLE LEFT  WITH HOLMIUM LASER AND URETERAL STENT PLACEMENT (Bilateral)     Patient location during evaluation: PACU Anesthesia Type: General Level of consciousness: awake and alert Pain management: pain level controlled Vital Signs Assessment: post-procedure vital signs reviewed and stable Respiratory status: spontaneous breathing, nonlabored ventilation and respiratory function stable Cardiovascular status: blood pressure returned to baseline and stable Postop Assessment: no signs of nausea or vomiting Anesthetic complications: no    Last Vitals:  Vitals:   12/29/16 1145 12/29/16 1151  BP: 116/67   Pulse: 68 68  Resp: 14 13  Temp:    SpO2: 98% 97%    Last Pain:  Vitals:   12/29/16 1130  TempSrc:   PainSc: Asleep                 Addylin Manke A.

## 2016-12-29 NOTE — H&P (Signed)
The following is a transcribed H&P as well as procedure note from 2 separatate office visits within 30 days   CC: I have blood in my urine.  HPI: Dustin Carter is a 61 year-old male established patient who is here for blood in the urine.  He did see the blood in his urine. He first noticed the symptoms approximately 11/18/2016. He has not seen blood clots.   He does not have a burning sensation when he urinates. He is not currently having trouble urinating.   He is not having pain. He has not recently had unwanted weight loss.   12/09/16:  The patient has been seen in the past for history of nephrolithiasis and enlarged prostate. He is having multiple ureteroscopy's as well as a an office procedure for enlarged prostate. For the past 3 weeks he has been having intermittent gross hematuria. This seems to be improving. He has no associated symptoms of dysuria. He has no pain or fever. He has mild lower urinary tract symptoms but is not bothered by these.   12/21/16;  The patient presents today for cystoscopy. CT scan revealed the following:   Right UVJ 5 mm calculus and left renal calculi. There are no renal or ureteral masses.   He states that he has bilateral flank pain. It is not severe, but is worse on the left. He is voiding well.   ALLERGIES: No Allergies   MEDICATIONS: Metoprolol Succinate 100 mg tablet, extended release 24 hr  Amitriptyline Hcl 25 mg tablet Oral  Amlodipine Besylate 5 mg tablet Oral  Aspir 81  Atorvastatin Calcium 40 mg tablet Oral  Cyclobenzaprine Hcl 10 mg tablet  Gabapentin 300 mg tablet 1 Oral  Losartan Potassium 50 mg tablet  Meloxicam 15 mg tablet Oral  Oxycodone-Acetaminophen 7.5 mg-325 mg tablet Oral  Pantoprazole Sodium 40 mg tablet, delayed release Oral    GU PSH: Cysto Uretero Lithotripsy - 2015 Cystoscopy - 12/21/2016 Cystoscopy And Treatment - 2009 Cystoscopy Insert Stent - 2015 Cystoscopy Ureteroscopy - 2009 Locm 300-399Mg /Ml Iodine,1Ml -  12/16/2016     PSH Notes: Cystoscopy With Ureteroscopy With Lithotripsy, Cystoscopy With Insertion Of Ureteral Stent Left, Back Surgery, Cholecystectomy, Prostate Surgery, Cholecystectomy, Back Surgery, Neck Surgery, Cystoscopy With Ureteroscopy Right, Cystoscopy With Manipulation Of Ureteral Calculus   NON-GU PSH: Cholecystectomy (open) - 2014, 2013   GU PMH: Gross hematuria - 12/21/2016, Gross hematuria, - 2015, Gross Hematuria, - 2014 Renal and ureteral calculus - 12/21/2016 Ureteral calculus, Left ureteral stone - 2015, Ureteral Stone, - 2014 Other microscopic hematuria, Microscopic hematuria - 2015, Microscopic Hematuria, - 2014 Renal calculus, Nephrolithiasis - 2015 Acute Cystitis/UTI, Acute Cystitis - 2014 Bladder-neck stenosis/contracture, Bladder neck contracture - 2014 BPH w/LUTS, Benign prostatic hyperplasia with urinary obstruction - 2014 History of urolithiasis, Nephrolithiasis - 2014 RLQ pain, Abdominal Pain In The Right Lower Belly (RLQ) - 2014 Splitting of urinary stream, Intermittent urinary stream - 2014     PMH Notes:  2007-10-25 11:54:53 - Note: Acute Myocardial Infarction  2013-01-10 10:23:08 - Note: Pneumonia  2007-12-05 09:10:16 - Note: Flank Pain Right   NON-GU PMH: Encounter for general adult medical examination without abnormal findings, Encounter for preventive health examination - 2015 Personal history of other diseases of the circulatory system, History of hypertension - 2014 Personal history of other diseases of the digestive system, History of esophageal reflux - 2014 Personal history of other endocrine, nutritional and metabolic disease, History of hypercholesterolemia - 2014   FAMILY HISTORY: Acute Myocardial Infarction - Runs In Family  Death In The Family Father - Runs In Family Dementia - Mother Family Health Status - Mother's Age - Runs In Family Father Deceased At Age86 ___ - Runs In Family Ischemic Stroke - Runs In Family liver cancer - Runs In  Family renal failure - Runs In Family   SOCIAL HISTORY: Marital Status: Single Preferred Language: English; Race: White Current Smoking Status: Patient does not smoke anymore.   Tobacco Use Assessment Completed:  Used Tobacco in last 30 days?  Drinks 1 caffeinated drink per day.    Notes: Occupation:, Alcohol Use, Previous History Of Smoking, Caffeine Use, Marital History - Single   REVIEW OF SYSTEMS:    GU Review Male:   Patient denies frequent urination, hard to postpone urination, burning/ pain with urination, get up at night to urinate, leakage of urine, stream starts and stops, trouble starting your stream, have to strain to urinate , erection problems, and penile pain.  Gastrointestinal (Upper):   Patient denies nausea, vomiting, and indigestion/ heartburn.  Gastrointestinal (Lower):   Patient denies diarrhea and constipation.  Constitutional:   Patient denies fever, night sweats, weight loss, and fatigue.  Skin:   Patient denies skin rash/ lesion and itching.  Eyes:   Patient denies blurred vision and double vision.  Ears/ Nose/ Throat:   Patient denies sore throat and sinus problems.  Hematologic/Lymphatic:   Patient denies easy bruising and swollen glands.  Cardiovascular:   Patient denies leg swelling and chest pains.  Respiratory:   Patient denies cough and shortness of breath.  Endocrine:   Patient denies excessive thirst.  Musculoskeletal:   Patient denies back pain and joint pain.  Neurological:   Patient denies headaches and dizziness.  Psychologic:   Patient denies depression and anxiety.  1  PAST DATA REVIEWED:  Source Of History:  Patient   12/09/16 01/05/13 01/25/11 12/15/10 07/31/09 05/01/09 12/05/07  PSA  Total PSA 0.25 ng/mL 1.16  0.46  0.53  0.61  0.75  0.58    PROCEDURES:         Flexible Cystoscopy - 52000  Risks, benefits, and some of the potential complications of the procedure were discussed at length with the patient including infection, bleeding,  voiding discomfort, urinary retention, fever, chills, sepsis, and others. All questions were answered. Informed consent was obtained. Antibiotic prophylaxis was given. Sterile technique and intraurethral analgesia were used.  Meatus:  Normal size. Normal location. Normal condition.  Urethra:  No strictures.  External Sphincter:  Normal.  Verumontanum:  Normal.  Prostate:  Non-obstructing. No hyperplasia.  Bladder Neck:  Non-obstructing.  Ureteral Orifices:  Normal location. Normal size. Normal shape. Effluxed clear urine.  Bladder:  No trabeculation. No tumors. Normal mucosa. No stones.      The lower urinary tract was carefully examined. The procedure was well-tolerated and without complications. Antibiotic instructions were given. Instructions were given to call the office immediately for bloody urine, difficulty urinating, urinary retention, painful or frequent urination, fever, chills, nausea, vomiting or other illness. The patient stated that he understood these instructions and would comply with them.       KUB - [id number]  A single view of the abdomen is obtained.  KUB Study: Abdominal Anterior-Posterior Radiograph  Date of Service: 12/21/2016  Indication: Right ureteral calculus  Bony Structures: Visualized bony structures of the ribs, spine, and pelvis demonstrate no obvious large lytic or blastic lesions.  Bowel Gas: Visualized bowel gas is non-obstructive appearing.   Rt Kidney: No radiodense structures are identified in the  expected location of the kidney. radiopaque area in right pelvis corresponds to ureteral calculus seen in CT scan . Lt Kidney: No radiodense structures are identified in the expected location of the ureter or kidney  Bladder Area / Pelvis: No radiodense structures are identified in the expected location of the urinary bladder.   Multiple bilateral densities consistent with phleboliths are visualized.   IMPRESSION: 1. radiopaque area in right pelvis  corresponds to ureteral calculus seen in CT scan              Urinalysis w/Scope - 81001 Dipstick Dipstick Cont'd Micro  Color: Yellow Bilirubin: Neg WBC/hpf: 0 - 5/hpf  Appearance: Clear Ketones: Neg RBC/hpf: 0 - 2/hpf  Specific Gravity: 1.020 Blood: Trace Bacteria: NS (Not Seen)  pH: 6.5 Protein: Neg Cystals: NS (Not Seen)  Glucose: Neg Urobilinogen: 0.2 Casts: NS (Not Seen)    Nitrites: Neg Trichomonas: Not Present    Leukocyte Esterase: Neg Mucous: Not Present      Epithelial Cells: NS (Not Seen)      Yeast: NS (Not Seen)      Sperm: Not Present   GU PHYSICAL EXAMINATION:    Anus and Perineum: No hemorrhoids. No anal stenosis. No rectal fissure, no anal fissure. No edema, no dimple, no perineal tenderness, no anal tenderness.  Scrotum: No lesions. No edema. No cysts. No warts.  Epididymides: Right: no spermatocele, no masses, no cysts, no tenderness, no induration, no enlargement. Left: no spermatocele, no masses, no cysts, no tenderness, no induration, no enlargement.  Testes: No tenderness, no swelling, no enlargement left testes. No tenderness, no swelling, no enlargement right testes. Normal location left testes. Normal location right testes. No mass, no cyst, no varicocele, no hydrocele left testes. No mass, no cyst, no varicocele, no hydrocele right testes.  Urethral Meatus: Normal size. No lesion, no wart, no discharge, no polyp. Normal location.  Penis: Circumcised, no warts, no cracks. No dorsal Peyronie's plaques, no left corporal Peyronie's plaques, no right corporal Peyronie's plaques, no scarring, no warts. No balanitis, no meatal stenosis.  Prostate: Prostate about 30 grams. Left lobe normal consistency, right lobe normal consistency. Symmetrical lobes. No prostate nodule. Left lobe no tenderness, right lobe no tenderness.   Seminal Vesicles: Nonpalpable.  Sphincter Tone: Normal sphincter. No rectal tenderness. No rectal mass.    MULTI-SYSTEM PHYSICAL EXAMINATION:     Constitutional: Well-nourished. No physical deformities. Normally developed. Good grooming.  Neck: Neck symmetrical, not swollen. Normal tracheal position.  Respiratory: No labored breathing, no use of accessory muscles.   Cardiovascular: Normal temperature, adequate perfusion  Skin: No paleness, no jaundice  Neurologic / Psychiatric: Oriented to time, oriented to place, oriented to person. No depression, no anxiety, no agitation.  Gastrointestinal: No mass, no tenderness, no rigidity, non obese abdomen.  Musculoskeletal: Normal gait and station of head and neck.    ASSESSMENT:      ICD-10 Details  1 GU:   Gross hematuria - R31.0   2   Renal and ureteral calculus - N20.2      PLAN:          Orders X-Rays: KUB         Schedule          Document Letter(s):  Created for Patient: Clinical Summary        Notes:   We discussed the management of urinary stones. These options include observation, ureteroscopy, shockwave lithotripsy, and PCNL. We discussed which options are relevant to these particular stones. We discussed  the natural history of stones as well as the complications of untreated stones and the impact on quality of life without treatment as well as with each of the above listed treatments. We also discussed the efficacy of each treatment in its ability to clear the stone burden. With any of these management options I discussed the signs and symptoms of infection and the need for emergent treatment should these be experienced. For each option we discussed the ability of each procedure to clear the patient of their stone burden.   For observation I described the risks which include but are not limited to silent renal damage, life-threatening infection, need for emergent surgery, failure to pass stone, and pain.   For ureteroscopy I described the risks which include heart attack, stroke, pulmonary embolus, death, bleeding, infection, damage to contiguous structures, positioning  injury, ureteral stricture, ureteral avulsion, ureteral injury, need for ureteral stent, inability to perform ureteroscopy, need for an interval procedure, inability to clear stone burden, stent discomfort and pain.   For shockwave lithotripsy I described the risks which include arrhythmia, kidney contusion, kidney hemorrhage, need for transfusion, pain, inability to break up stone, inability to pass stone fragments, Steinstrasse, infection associated with obstructing stones, need for different surgical procedure, need for repeat shockwave lithotripsy, and death.    Proceed with ureteroscopy with laser lithotripsy. We'll perform this on the right side first. If this goes well proceed with left ureteroscopy, but I informed him this may be a staged operation for each side.    Signed by Modena Slater, III, M.D. on 12/29/16 at 7:09 AM (EDT)

## 2016-12-29 NOTE — Anesthesia Procedure Notes (Signed)
Procedure Name: LMA Insertion Date/Time: 12/29/2016 9:52 AM Performed by: Maris Berger T Pre-anesthesia Checklist: Patient identified, Emergency Drugs available, Suction available and Patient being monitored Patient Re-evaluated:Patient Re-evaluated prior to induction Oxygen Delivery Method: Circle system utilized Preoxygenation: Pre-oxygenation with 100% oxygen Induction Type: IV induction Ventilation: Mask ventilation without difficulty LMA: LMA inserted LMA Size: 5.0 Number of attempts: 1 Airway Equipment and Method: Bite block Placement Confirmation: positive ETCO2 Tube secured with: Tape Dental Injury: Teeth and Oropharynx as per pre-operative assessment

## 2016-12-29 NOTE — Op Note (Signed)
Preoperative diagnosis: #1 right ureteral calculus #2 left renal calculi  Postoperative diagnosis: Left renal calculi, small bulbar urethral stricture  Operative surgeon: Modena Slater  Procedure performed:Cystourethroscopy with urethral stricture dilation with bilateral retrograde pyelogram right ureteroscopy and left ureteroscopy with stone basketing and bilateral ureteral stent placement  Anesthesia: Gen.  Estimated blood loss minimal  IV fluids please see anesthesia records  Complications: None immediate  Condition following operation: Stable  Drains and tubes: Bilateral 6 x 26 double-J ureteral stents  Specimens: Left renal calculus for analysis  Findings: 1. The bulbar urethra had approximately 14 French urethral stricture that was able be passively dilated. 2. Bilateral retrograde pyelograms revealed no filling defects. 3. No ureteral calculi bilaterally. There were renal calculi on the left but none on the right. There were multiple Randall's plaques on the left.  Indications for the operation: This a patient who was found to have a right ureteral calculus as well as left renal calculi. He therefore presents for the aformentioned operation.  Description of operation: The patient was identified and consent was obtained. The patient was taken back to the operating room and placed in the supine position. Gen. Anesthesia was induced. He was converted to dorsal lithotomy and prepped and draped in standard sterile fashion. A timeout was performed. A 21 French rigid cystourethroscope was advanced into the urethra and into the bladder.of note there was a bulbar urethral stricture that was able to be passively dilated with the scope. The bladder was systematically inspected without any abnormalities found. The right ureter was cannulated with a open ended ureteral catheter and a retrograde pyelogram was performed.a sensor wire and advanced up to the kidney under fluoroscopic guidance. A  semirigid ureteroscope was then advanced alongside the wire and no ureteral calculi were seen. A second sensor wire was placed. Under fluoroscopic guidance a 12 x 14 ureteral access sheath was advanced over one of the wires and up the ureter. Flexible ureteroscopy then revealed no renal or ureteral calculi. The ureteroscope and sheath was withdrawn without any injury or calculi noted. The sensor wire was backloaded onto a cystoscope and a 6 x 26 double-J ureteral stent was placed. Fluoroscopy confirmed proximal placementand direct visualization confirmed distal placement.  The left ureter was then cannulated with a open-ended ureteral catheter and retrograde pyelogram was performed. A sensor wire was then advanced into the kidney under fluoroscopic guidance. A second sensor wirewas placed alongside this and up to the kidney. Under fluoroscopic guidance a 12 x 14 ureteral access sheath was advanced over one of the wires and up the ureter. Flexible ureteroscopy revealed multiple stone fragments. These were able to be basket extracted. There were also multiple Randall's plaques. After removing multiple stones, the kidney was inspected and no other appreciable stone fragments were seen. The scopeand the access sheath were then withdrawn and no ureteral injury or ureteral calculi were seen. The sensor wire was then backloaded onto a cystoscope and a 6 x 26 double-J ureteral stent was placed. Fluoroscopy confirmed proximal placement and direct visualization confirmed distal placement within the bladder. The bladder was drained and scope was withdrawn and this concluded the operation.  Disposition: The patient will return to clinic in 1 week for ureteral stent removal will then follow up in 4-6 weeks for a renal ultrasound and KUB. He does not need a KUB at the one-week visit.

## 2016-12-30 ENCOUNTER — Encounter (HOSPITAL_BASED_OUTPATIENT_CLINIC_OR_DEPARTMENT_OTHER): Payer: Self-pay | Admitting: Urology

## 2017-03-31 ENCOUNTER — Other Ambulatory Visit: Payer: Self-pay | Admitting: Cardiology

## 2017-03-31 NOTE — Telephone Encounter (Signed)
This is Dr. Berry's pt 

## 2017-04-01 ENCOUNTER — Other Ambulatory Visit: Payer: Self-pay

## 2017-04-01 MED ORDER — LOSARTAN POTASSIUM 50 MG PO TABS: 50.0000 mg | ORAL_TABLET | Freq: Every day | ORAL | 3 refills | Status: DC

## 2017-04-30 ENCOUNTER — Encounter (HOSPITAL_COMMUNITY): Payer: Self-pay | Admitting: Emergency Medicine

## 2017-04-30 ENCOUNTER — Ambulatory Visit (HOSPITAL_COMMUNITY)
Admission: EM | Admit: 2017-04-30 | Discharge: 2017-04-30 | Disposition: A | Discharge status: Home / Self Care | Payer: Medicare Other | Attending: Family Medicine | Admitting: Family Medicine

## 2017-04-30 ENCOUNTER — Ambulatory Visit (INDEPENDENT_AMBULATORY_CARE_PROVIDER_SITE_OTHER): Payer: Medicare Other

## 2017-04-30 DIAGNOSIS — R0602 Shortness of breath: Secondary | ICD-10-CM | POA: Diagnosis not present

## 2017-04-30 DIAGNOSIS — J069 Acute upper respiratory infection, unspecified: Secondary | ICD-10-CM

## 2017-04-30 NOTE — ED Provider Notes (Signed)
[facility]   [id number] 04/30/17 Arrival Time: 1205  ASSESSMENT & PLAN:  1. SOB (shortness of breath)   2. Viral upper respiratory tract infection    Imaging: Dg Chest 2 View  Result Date: 04/30/2017 CLINICAL DATA:  Chest pain and shortness of breath.  Cough. EXAM: CHEST  2 VIEW COMPARISON:  08/30/2016; 07/24/2015 FINDINGS: Grossly unchanged cardiac silhouette and mediastinal contours with atherosclerotic plaque within the thoracic aorta. The lungs are hyperexpanded with flattening the diaphragms and minimal bibasilar subsegmental atelectasis. There is minimal pleuroparenchymal thickening about the right minor bilateral fissures. No focal airspace opacities. No pleural effusion or pneumothorax. No evidence of edema. No acute osseus abnormalities. Post lower cervical ACDF and paraspinal fusion, lumbar paraspinal fusion and intervertebral disc space replacement and left hemi humeral arthroplasty, incompletely evaluated. IMPRESSION: Mild lung hyperexpansion and bronchitic change without definitive superimposed acute cardiopulmonary disease. Electronically Signed   By: Simonne Come M.D.   On: 04/30/2017 13:17   OTC symptom care as needed. Will f/u if not improving over the next several days. Ensure adequate fluid intake and rest. May f/u with PCP or here as needed.  Reviewed expectations re: course of current medical issues. Questions answered. Outlined signs and symptoms indicating need for more acute intervention. Patient verbalized understanding. After Visit Summary given.   SUBJECTIVE: History from: patient.  Dustin Carter is a 62 y.o. male who presents with complaint of nasal congestion, post-nasal drainage, and a persistent dry cough. Onset abrupt, approximately 5-6 days ago. Overall fatigued with body aches. SOB: occasional and reports slight worsening over the past couple of days. Wheezing: none. Fever: questions subjective; "chilled sometimes at night:. Overall normal PO  intake without n/v. Sick contacts: no. OTC treatment: Mucinex DM without relief.  Received flu shot this year: no.  Social History   Tobacco Use  Smoking Status Former Smoker  . Packs/day: 1.00  . Years: 12.00  . Pack years: 12.00  . Types: Cigarettes  . Last attempt to quit: 04/26/1992  . Years since quitting: 25.0  Smokeless Tobacco Former Neurosurgeon  . Types: Chew  . Quit date: 04/26/1980  Tobacco Comment   used chew for 12 years before started smoking    ROS: As per HPI.   OBJECTIVE:  Vitals:   04/30/17 1233  BP: 119/73  Pulse: 69  Resp: 16  Temp: 98.4 F (36.9 C)  TempSrc: Oral  SpO2: 97%     General appearance: alert; appears fatigued HEENT: nasal congestion; clear runny nose; throat irritation secondary to post-nasal drainage Neck: supple without LAD CV: RRR Lungs: overall clear to auscultation bilaterally; cough: mild; no respiratory distress Skin: warm and dry Psychological: alert and cooperative; normal mood and affect   Allergies  Allergen Reactions  . Butrans [Buprenorphine] Itching       . Other Itching    Pain patch (unsure of name)    Past Medical History:  Diagnosis Date  . Arthritis    SPINE AND "HEAD TO TOES"  . Atypical chest pain at rest   cardiologist-- dr berry-- work up done per note all normal (stress test and cardiac cath)  . BPH (benign prostatic hypertrophy) Mar 29, 2011   TREATED IN DR. TANNENBAUM'S OFFICE WITH "HEAT" THERAPY TO SHRINK THE PROSTATE  . DOE (dyspnea on exertion)   . Family history of adverse reaction to anesthesia    MOTHER--- HARD TO WAKE  . Full dentures   . GERD (gastroesophageal reflux disease)   . History of kidney stones   .  Hyperlipidemia   . Hypertension   . Polyneuropathy    DUE TO POST SURGERY'S  . Renal calculi    bilateral   . Sinus tachycardia cardiologist- dr berry   hx palpitations w/ holter event monitor showed SR, SB, runs of PSVT-- pt takes beta blocker  . Wears glasses    Family History    Problem Relation Age of Onset  . Heart disease Father   . Hyperlipidemia Father   . Heart disease Maternal Grandmother   . Stroke Paternal Grandfather   . Heart disease Brother   . Hyperlipidemia Brother   . Cancer Maternal Aunt        brain  . Heart disease Maternal Uncle   . Cancer Paternal Uncle        liver  . Heart disease Brother   . Hyperlipidemia Brother   . Heart disease Maternal Uncle    Social History   Socioeconomic History  . Marital status: Single    Spouse name: Not on file  . Number of children: Not on file  . Years of education: Not on file  . Highest education level: Not on file  Social Needs  . Financial resource strain: Not on file  . Food insecurity - worry: Not on file  . Food insecurity - inability: Not on file  . Transportation needs - medical: Not on file  . Transportation needs - non-medical: Not on file  Occupational History  . Not on file  Tobacco Use  . Smoking status: Former Smoker    Packs/day: 1.00    Years: 12.00    Pack years: 12.00    Types: Cigarettes    Last attempt to quit: 04/26/1992    Years since quitting: 25.0  . Smokeless tobacco: Former Neurosurgeon    Types: Chew    Quit date: 04/26/1980  . Tobacco comment: used chew for 12 years before started smoking  Substance and Sexual Activity  . Alcohol use: No  . Drug use: No  . Sexual activity: No  Other Topics Concern  . Not on file  Social History Narrative  . Not on file           Mardella Layman, MD 04/30/17 1326

## 2017-04-30 NOTE — Discharge Instructions (Signed)
Follow up with your primary care doctor or here if you are not seeing improvement of your symptoms over the next several days, sooner if you feel you are worsening.  Caring for yourself: Get plenty of rest. Drink plenty of fluids, enough so that your urine is light yellow or clear like water. If you have kidney, heart, or liver disease and have to limit fluids, talk with your doctor before you increase the amount of fluids you drink. Take an over-the-counter pain medicine if needed, such as acetaminophen (Tylenol), ibuprofen (Advil, Motrin), or naproxen (Aleve), to relieve fever, headache, and muscle aches. Read and follow all instructions on the label. No one younger than 20 should take aspirin. It has been linked to Reye syndrome, a serious illness. Before you use over the counter cough and cold medicines, check the label. These medicines may not be safe for children younger than age 87 or for people with certain health problems. If the skin around your nose and lips becomes sore, put some petroleum jelly on the area.  Avoid spreading the an viral respiratory infection: Wash your hands regularly, and keep your hands away from your face.  Stay home from school, work, and other public places until you are feeling better and your fever has been gone for at least 24 hours. The fever needs to have gone away on its own without the help of medicine.

## 2017-04-30 NOTE — ED Triage Notes (Signed)
PT C/O: SOB associated w/CP, palpitation, back pain, chest congestion  Sts he would like a chest xray and blood work  ONSET: 5 days  DENIES: fevers  TAKING MEDS: OTC Mucinex   A&O x4... NAD... Ambulatory

## 2017-05-10 ENCOUNTER — Other Ambulatory Visit: Payer: Self-pay | Admitting: Internal Medicine

## 2017-05-10 DIAGNOSIS — R002 Palpitations: Secondary | ICD-10-CM

## 2017-05-11 ENCOUNTER — Ambulatory Visit (INDEPENDENT_AMBULATORY_CARE_PROVIDER_SITE_OTHER): Payer: Medicare Other

## 2017-05-11 DIAGNOSIS — R002 Palpitations: Secondary | ICD-10-CM

## 2017-05-17 ENCOUNTER — Ambulatory Visit: Payer: Medicare Other | Admitting: Cardiovascular Disease

## 2017-05-17 ENCOUNTER — Encounter: Payer: Self-pay | Admitting: Cardiovascular Disease

## 2017-05-17 VITALS — BP 128/74 | HR 65 | Ht 69.0 in | Wt 178.0 lb

## 2017-05-17 DIAGNOSIS — E7800 Pure hypercholesterolemia, unspecified: Secondary | ICD-10-CM | POA: Diagnosis not present

## 2017-05-17 DIAGNOSIS — I208 Other forms of angina pectoris: Secondary | ICD-10-CM | POA: Diagnosis not present

## 2017-05-17 DIAGNOSIS — R002 Palpitations: Secondary | ICD-10-CM | POA: Diagnosis not present

## 2017-05-17 NOTE — Assessment & Plan Note (Signed)
History of palpitations with event monitoring the past showing episodes of PSVT improved with beta blockade. He easily had a Holter monitor that did not show any significant arrhythmias.

## 2017-05-17 NOTE — Assessment & Plan Note (Signed)
History of hyperlipidemia on statin therapy with lipid profile performed 08/31/16 revealed a total cholesterol of 160, LDL 105 and HDL of 31

## 2017-05-17 NOTE — Assessment & Plan Note (Signed)
History of atypical chest pain with normal cardiac catheterization performed by myself 11/02/11 with a negative Myoview 08/31/16. I do not think he has ischemic chest pain.

## 2017-05-17 NOTE — Progress Notes (Signed)
05/17/2017 Dustin Carter   Jul 06, 1955  [id number]  Primary Physician Merri Brunette, MD Primary Cardiologist: Runell Gess MD FACP, [place], [place], [place]  HPI:  Dustin Carter is a 62 y.o.  mildly overweight single Caucasian male children is currently on disability because of multiple back operations. I last saw him in the office 09/24/16 His only risk factor is treated hyperlipidemia and family history. He's never had a heart attack or stroke. I did perform a cardiac catheterization on him as an outpatient 11/02/11 which is entirely normal. This hospitalized from [address] of this month with atypical chest pain and palpitations. Event monitor showed sinus rhythm/sinus bradycardia runs of PSVT. His beta blocker was uptitrated. A 2-D echo was normal and a Myoview stress test was low risk. Since I saw him 6 months ago he has noticed some palpitations and atypical chest pain principally since New Year's Day. A recent Holter monitor was unrevealing.   Current Meds  Medication Sig  . amitriptyline (ELAVIL) 150 MG tablet Take 150 mg by mouth at bedtime.  Marland Kitchen aspirin EC 81 MG EC tablet Take 1 tablet (81 mg total) by mouth at bedtime.  Marland Kitchen atorvastatin (LIPITOR) 40 MG tablet Take 40 mg by mouth at bedtime.  . cyclobenzaprine (FLEXERIL) 10 MG tablet Take 10 mg by mouth at bedtime. For muscle spasms  . gabapentin (NEURONTIN) 300 MG capsule Take 300 mg by mouth 3 (three) times daily.   . meloxicam (MOBIC) 15 MG tablet Take 15 mg by mouth every morning.   . metoprolol succinate (TOPROL-XL) 100 MG 24 hr tablet Take 1 tablet (100 mg total) by mouth every morning. Take with or immediately following a meal.  . oxyCODONE-acetaminophen (PERCOCET) 7.5-325 MG per tablet Take 1 tablet by mouth every 4 (four) hours as needed for pain.  . pantoprazole (PROTONIX) 40 MG tablet Take 40 mg by mouth every morning.      Allergies  Allergen Reactions  . Butrans [Buprenorphine] Itching       . Other Itching    Pain patch  (unsure of name)    Social History   Socioeconomic History  . Marital status: Single    Spouse name: Not on file  . Number of children: Not on file  . Years of education: Not on file  . Highest education level: Not on file  Social Needs  . Financial resource strain: Not on file  . Food insecurity - worry: Not on file  . Food insecurity - inability: Not on file  . Transportation needs - medical: Not on file  . Transportation needs - non-medical: Not on file  Occupational History  . Not on file  Tobacco Use  . Smoking status: Former Smoker    Packs/day: 1.00    Years: 12.00    Pack years: 12.00    Types: Cigarettes    Last attempt to quit: 04/26/1992    Years since quitting: 25.0  . Smokeless tobacco: Former Neurosurgeon    Types: Chew    Quit date: 04/26/1980  . Tobacco comment: used chew for 12 years before started smoking  Substance and Sexual Activity  . Alcohol use: No  . Drug use: No  . Sexual activity: No  Other Topics Concern  . Not on file  Social History Narrative  . Not on file     Review of Systems: General: negative for chills, fever, night sweats or weight changes.  Cardiovascular: negative for chest pain, dyspnea on exertion, edema, orthopnea,  palpitations, paroxysmal nocturnal dyspnea or shortness of breath Dermatological: negative for rash Respiratory: negative for cough or wheezing Urologic: negative for hematuria Abdominal: negative for nausea, vomiting, diarrhea, bright red blood per rectum, melena, or hematemesis Neurologic: negative for visual changes, syncope, or dizziness All other systems reviewed and are otherwise negative except as noted above.    Blood pressure 128/74, pulse 65, height 5\' 9"  (1.753 m), weight 178 lb (80.7 kg).  General appearance: alert and no distress Neck: no adenopathy, no carotid bruit, no JVD, supple, symmetrical, trachea midline and thyroid not enlarged, symmetric, no tenderness/mass/nodules Lungs: clear to auscultation  bilaterally Heart: regular rate and rhythm, S1, S2 normal, no murmur, click, rub or gallop Extremities: extremities normal, atraumatic, no cyanosis or edema Pulses: 2+ and symmetric Skin: Skin color, texture, turgor normal. No rashes or lesions Neurologic: Alert and oriented X 3, normal strength and tone. Normal symmetric reflexes. Normal coordination and gait  EKG sinus rhythm of 65 without ST or T-wave changes. I personally reviewed this EKG  ASSESSMENT AND PLAN:   Hyperlipidemia History of hyperlipidemia on statin therapy with lipid profile performed 08/31/16 revealed a total cholesterol of 160, LDL 105 and HDL of 31  Palpitations History of palpitations with event monitoring the past showing episodes of PSVT improved with beta blockade. He easily had a Holter monitor that did not show any significant arrhythmias.  Chest pain History of atypical chest pain with normal cardiac catheterization performed by myself 11/02/11 with a negative Myoview 08/31/16. I do not think he has ischemic chest pain.      10/31/16 MD FACP,FACC,FAHA, [facility] 05/17/2017 4:26 PM

## 2017-05-17 NOTE — Patient Instructions (Signed)

## 2017-07-09 ENCOUNTER — Other Ambulatory Visit: Payer: Self-pay | Admitting: Cardiology

## 2017-07-11 NOTE — Telephone Encounter (Signed)
This is Dr. Berry's pt 

## 2017-07-11 NOTE — Telephone Encounter (Signed)
REFILL 

## 2018-04-15 ENCOUNTER — Other Ambulatory Visit: Payer: Self-pay | Admitting: Cardiovascular Disease

## 2018-04-17 ENCOUNTER — Other Ambulatory Visit: Payer: Self-pay | Admitting: Cardiovascular Disease

## 2018-05-17 DIAGNOSIS — Z6826 Body mass index (BMI) 26.0-26.9, adult: Secondary | ICD-10-CM | POA: Diagnosis not present

## 2018-05-17 DIAGNOSIS — I10 Essential (primary) hypertension: Secondary | ICD-10-CM | POA: Diagnosis not present

## 2018-05-17 DIAGNOSIS — M545 Low back pain: Secondary | ICD-10-CM | POA: Diagnosis not present

## 2018-05-17 DIAGNOSIS — M542 Cervicalgia: Secondary | ICD-10-CM | POA: Diagnosis not present

## 2018-06-05 IMAGING — DX DG CHEST 2V
2 series · 2 of 2 positions shown · non-contrast
Comparison: 08/30/2016; 07/24/2015

CLINICAL DATA: Chest pain and shortness of breath.  Cough.

EXAM:
CHEST  2 VIEW

[chest pa]
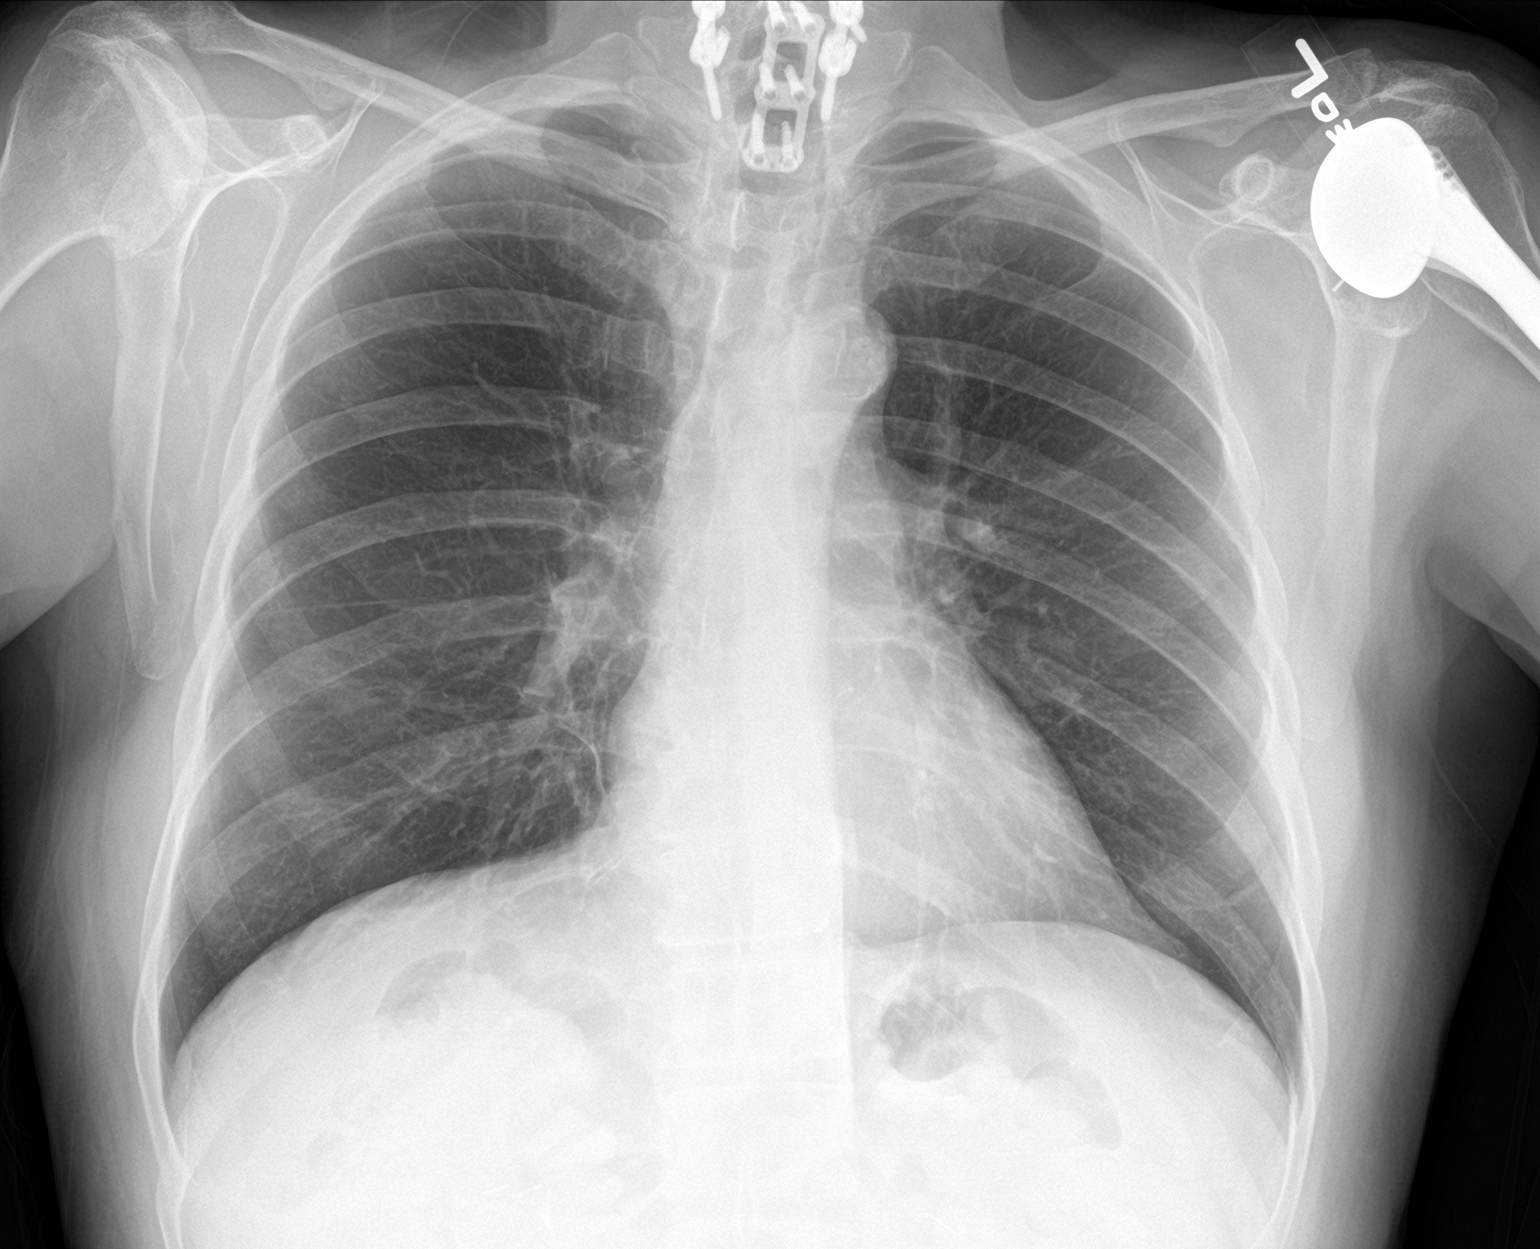

[chest lat]
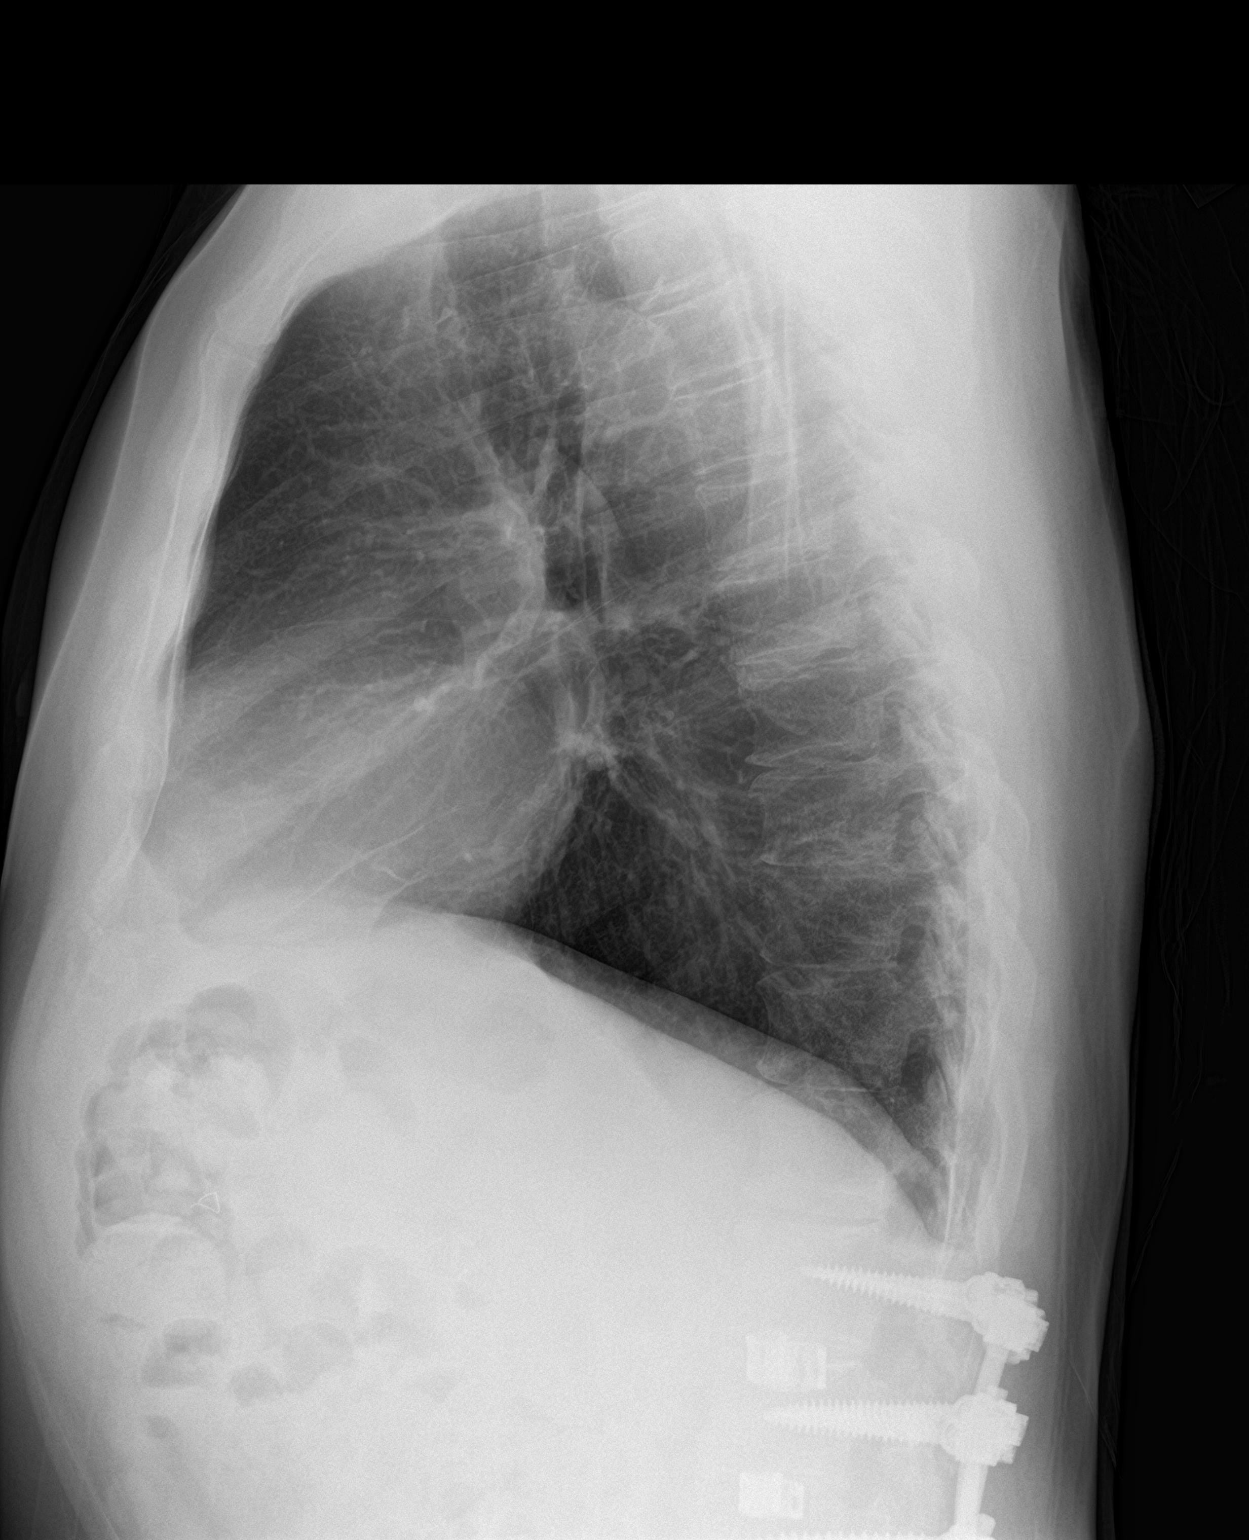

[2 of 2 positions shown; findings below may reference images not displayed]

FINDINGS: Grossly unchanged cardiac silhouette and mediastinal contours with
atherosclerotic plaque within the thoracic aorta. The lungs are
hyperexpanded with flattening the diaphragms and minimal bibasilar
subsegmental atelectasis. There is minimal pleuroparenchymal
thickening about the right minor bilateral fissures. No focal
airspace opacities. No pleural effusion or pneumothorax. No evidence
of edema. No acute osseus abnormalities. Post lower cervical ACDF
and paraspinal fusion, lumbar paraspinal fusion and intervertebral
disc space replacement and left hemi humeral arthroplasty,
incompletely evaluated.
IMPRESSION: Mild lung hyperexpansion and bronchitic change without definitive
superimposed acute cardiopulmonary disease.

## 2018-06-12 DIAGNOSIS — M542 Cervicalgia: Secondary | ICD-10-CM | POA: Diagnosis not present

## 2018-06-12 DIAGNOSIS — I10 Essential (primary) hypertension: Secondary | ICD-10-CM | POA: Diagnosis not present

## 2018-08-10 DIAGNOSIS — Q761 Klippel-Feil syndrome: Secondary | ICD-10-CM | POA: Diagnosis not present

## 2018-08-10 DIAGNOSIS — M961 Postlaminectomy syndrome, not elsewhere classified: Secondary | ICD-10-CM | POA: Diagnosis not present

## 2018-08-10 DIAGNOSIS — M542 Cervicalgia: Secondary | ICD-10-CM | POA: Diagnosis not present

## 2018-08-10 DIAGNOSIS — M5416 Radiculopathy, lumbar region: Secondary | ICD-10-CM | POA: Diagnosis not present

## 2018-09-07 DIAGNOSIS — M5416 Radiculopathy, lumbar region: Secondary | ICD-10-CM | POA: Diagnosis not present

## 2018-10-12 DIAGNOSIS — M545 Low back pain: Secondary | ICD-10-CM | POA: Diagnosis not present

## 2018-10-12 DIAGNOSIS — M961 Postlaminectomy syndrome, not elsewhere classified: Secondary | ICD-10-CM | POA: Diagnosis not present

## 2018-10-12 DIAGNOSIS — Q761 Klippel-Feil syndrome: Secondary | ICD-10-CM | POA: Diagnosis not present

## 2019-01-04 DIAGNOSIS — Z5181 Encounter for therapeutic drug level monitoring: Secondary | ICD-10-CM | POA: Diagnosis not present

## 2019-01-04 DIAGNOSIS — Z6826 Body mass index (BMI) 26.0-26.9, adult: Secondary | ICD-10-CM | POA: Diagnosis not present

## 2019-01-04 DIAGNOSIS — Z79891 Long term (current) use of opiate analgesic: Secondary | ICD-10-CM | POA: Diagnosis not present

## 2019-01-04 DIAGNOSIS — M961 Postlaminectomy syndrome, not elsewhere classified: Secondary | ICD-10-CM | POA: Diagnosis not present

## 2019-01-04 DIAGNOSIS — Z79899 Other long term (current) drug therapy: Secondary | ICD-10-CM | POA: Diagnosis not present

## 2019-01-04 DIAGNOSIS — I10 Essential (primary) hypertension: Secondary | ICD-10-CM | POA: Diagnosis not present

## 2019-01-04 DIAGNOSIS — M461 Sacroiliitis, not elsewhere classified: Secondary | ICD-10-CM | POA: Diagnosis not present

## 2019-01-25 DIAGNOSIS — Z125 Encounter for screening for malignant neoplasm of prostate: Secondary | ICD-10-CM | POA: Diagnosis not present

## 2019-01-25 DIAGNOSIS — I10 Essential (primary) hypertension: Secondary | ICD-10-CM | POA: Diagnosis not present

## 2019-01-26 ENCOUNTER — Inpatient Hospital Stay (HOSPITAL_COMMUNITY)
Admission: EM | Admit: 2019-01-26 | Discharge: 2019-01-28 | DRG: 378 | Disposition: A | Discharge status: Home / Self Care | Payer: PPO | Attending: Internal Medicine | Admitting: Internal Medicine

## 2019-01-26 ENCOUNTER — Other Ambulatory Visit: Payer: Self-pay

## 2019-01-26 ENCOUNTER — Encounter (HOSPITAL_COMMUNITY): Payer: Self-pay | Admitting: Internal Medicine

## 2019-01-26 ENCOUNTER — Ambulatory Visit (HOSPITAL_COMMUNITY)
Admission: EM | Admit: 2019-01-26 | Discharge: 2019-01-26 | Disposition: A | Discharge status: Home / Self Care | Payer: PPO

## 2019-01-26 DIAGNOSIS — K254 Chronic or unspecified gastric ulcer with hemorrhage: Secondary | ICD-10-CM | POA: Diagnosis present

## 2019-01-26 DIAGNOSIS — Z791 Long term (current) use of non-steroidal anti-inflammatories (NSAID): Secondary | ICD-10-CM | POA: Diagnosis not present

## 2019-01-26 DIAGNOSIS — D500 Iron deficiency anemia secondary to blood loss (chronic): Secondary | ICD-10-CM | POA: Diagnosis not present

## 2019-01-26 DIAGNOSIS — Z79891 Long term (current) use of opiate analgesic: Secondary | ICD-10-CM | POA: Diagnosis not present

## 2019-01-26 DIAGNOSIS — I10 Essential (primary) hypertension: Secondary | ICD-10-CM | POA: Diagnosis not present

## 2019-01-26 DIAGNOSIS — Z8249 Family history of ischemic heart disease and other diseases of the circulatory system: Secondary | ICD-10-CM

## 2019-01-26 DIAGNOSIS — K922 Gastrointestinal hemorrhage, unspecified: Secondary | ICD-10-CM

## 2019-01-26 DIAGNOSIS — K219 Gastro-esophageal reflux disease without esophagitis: Secondary | ICD-10-CM | POA: Diagnosis present

## 2019-01-26 DIAGNOSIS — K264 Chronic or unspecified duodenal ulcer with hemorrhage: Principal | ICD-10-CM | POA: Diagnosis present

## 2019-01-26 DIAGNOSIS — Z823 Family history of stroke: Secondary | ICD-10-CM | POA: Diagnosis not present

## 2019-01-26 DIAGNOSIS — K279 Peptic ulcer, site unspecified, unspecified as acute or chronic, without hemorrhage or perforation: Secondary | ICD-10-CM | POA: Diagnosis not present

## 2019-01-26 DIAGNOSIS — K259 Gastric ulcer, unspecified as acute or chronic, without hemorrhage or perforation: Secondary | ICD-10-CM | POA: Diagnosis not present

## 2019-01-26 DIAGNOSIS — G8929 Other chronic pain: Secondary | ICD-10-CM | POA: Diagnosis present

## 2019-01-26 DIAGNOSIS — Z862 Personal history of diseases of the blood and blood-forming organs and certain disorders involving the immune mechanism: Secondary | ICD-10-CM | POA: Diagnosis present

## 2019-01-26 DIAGNOSIS — E785 Hyperlipidemia, unspecified: Secondary | ICD-10-CM | POA: Diagnosis present

## 2019-01-26 DIAGNOSIS — Z20828 Contact with and (suspected) exposure to other viral communicable diseases: Secondary | ICD-10-CM | POA: Diagnosis present

## 2019-01-26 DIAGNOSIS — Z8349 Family history of other endocrine, nutritional and metabolic diseases: Secondary | ICD-10-CM

## 2019-01-26 DIAGNOSIS — D62 Acute posthemorrhagic anemia: Secondary | ICD-10-CM | POA: Diagnosis present

## 2019-01-26 DIAGNOSIS — K921 Melena: Secondary | ICD-10-CM

## 2019-01-26 DIAGNOSIS — D509 Iron deficiency anemia, unspecified: Secondary | ICD-10-CM | POA: Diagnosis not present

## 2019-01-26 DIAGNOSIS — Z79899 Other long term (current) drug therapy: Secondary | ICD-10-CM | POA: Diagnosis not present

## 2019-01-26 DIAGNOSIS — K269 Duodenal ulcer, unspecified as acute or chronic, without hemorrhage or perforation: Secondary | ICD-10-CM | POA: Diagnosis not present

## 2019-01-26 DIAGNOSIS — Z7982 Long term (current) use of aspirin: Secondary | ICD-10-CM | POA: Diagnosis not present

## 2019-01-26 DIAGNOSIS — M549 Dorsalgia, unspecified: Secondary | ICD-10-CM | POA: Diagnosis not present

## 2019-01-26 DIAGNOSIS — K3189 Other diseases of stomach and duodenum: Secondary | ICD-10-CM | POA: Diagnosis not present

## 2019-01-26 DIAGNOSIS — Z03818 Encounter for observation for suspected exposure to other biological agents ruled out: Secondary | ICD-10-CM | POA: Diagnosis not present

## 2019-01-26 LAB — CBC
HCT: 28.4 % — ABNORMAL LOW (ref 39.0–52.0)
HCT: 33.8 % — ABNORMAL LOW (ref 39.0–52.0)
Hemoglobin: 7.9 g/dL — ABNORMAL LOW (ref 13.0–17.0)
Hemoglobin: 9.8 g/dL — ABNORMAL LOW (ref 13.0–17.0)
MCH: 20.3 pg — ABNORMAL LOW (ref 26.0–34.0)
MCH: 21.4 pg — ABNORMAL LOW (ref 26.0–34.0)
MCHC: 27.8 g/dL — ABNORMAL LOW (ref 30.0–36.0)
MCHC: 29 g/dL — ABNORMAL LOW (ref 30.0–36.0)
MCV: 72.8 fL — ABNORMAL LOW (ref 80.0–100.0)
MCV: 73.8 fL — ABNORMAL LOW (ref 80.0–100.0)
Platelets: 205 10*3/uL (ref 150–400)
Platelets: 215 10*3/uL (ref 150–400)
RBC: 3.9 MIL/uL — ABNORMAL LOW (ref 4.22–5.81)
RBC: 4.58 MIL/uL (ref 4.22–5.81)
RDW: 16.7 % — ABNORMAL HIGH (ref 11.5–15.5)
RDW: 17.2 % — ABNORMAL HIGH (ref 11.5–15.5)
WBC: 6.7 10*3/uL (ref 4.0–10.5)
WBC: 7.6 10*3/uL (ref 4.0–10.5)
nRBC: 0 % (ref 0.0–0.2)
nRBC: 0 % (ref 0.0–0.2)

## 2019-01-26 LAB — COMPREHENSIVE METABOLIC PANEL
ALT: 15 U/L (ref 0–44)
AST: 16 U/L (ref 15–41)
Albumin: 3.1 g/dL — ABNORMAL LOW (ref 3.5–5.0)
Alkaline Phosphatase: 103 U/L (ref 38–126)
Anion gap: 9 (ref 5–15)
BUN: 10 mg/dL (ref 8–23)
CO2: 29 mmol/L (ref 22–32)
Calcium: 8.6 mg/dL — ABNORMAL LOW (ref 8.9–10.3)
Chloride: 100 mmol/L (ref 98–111)
Creatinine, Ser: 1.15 mg/dL (ref 0.61–1.24)
GFR calc Af Amer: >60 mL/min (ref >60)
GFR calc non Af Amer: >60 mL/min (ref >60)
Glucose, Bld: 98 mg/dL (ref 70–99)
Potassium: 4 mmol/L (ref 3.5–5.1)
Sodium: 138 mmol/L (ref 135–145)
Total Bilirubin: 0.4 mg/dL (ref 0.3–1.2)
Total Protein: 5.8 g/dL — ABNORMAL LOW (ref 6.5–8.1)

## 2019-01-26 LAB — POC OCCULT BLOOD, ED: Fecal Occult Bld: NEGATIVE

## 2019-01-26 LAB — CBG MONITORING, ED: Glucose-Capillary: 88 mg/dL (ref 70–99)

## 2019-01-26 LAB — PREPARE RBC (CROSSMATCH)

## 2019-01-26 MED ORDER — LABETALOL HCL 5 MG/ML IV SOLN: 5.0000 mg | INTRAVENOUS | Status: DC | PRN

## 2019-01-26 MED ORDER — ACETAMINOPHEN 325 MG PO TABS: 650.0000 mg | ORAL_TABLET | Freq: Four times a day (QID) | ORAL | Status: DC | PRN

## 2019-01-26 MED ORDER — PANTOPRAZOLE SODIUM 40 MG IV SOLR
40.0000 mg | Freq: Once | INTRAVENOUS | Status: AC
  Administered 2019-01-26: 40 mg via INTRAVENOUS
  Filled 2019-01-26: qty 40

## 2019-01-26 MED ORDER — ACETAMINOPHEN 650 MG RE SUPP: 650.0000 mg | Freq: Four times a day (QID) | RECTAL | Status: DC | PRN

## 2019-01-26 MED ORDER — PANTOPRAZOLE SODIUM 40 MG IV SOLR: 40.0000 mg | Freq: Two times a day (BID) | INTRAVENOUS | Status: DC

## 2019-01-26 MED ORDER — ONDANSETRON HCL 4 MG PO TABS: 4.0000 mg | ORAL_TABLET | Freq: Four times a day (QID) | ORAL | Status: DC | PRN

## 2019-01-26 MED ORDER — ONDANSETRON HCL 4 MG/2ML IJ SOLN: 4.0000 mg | Freq: Four times a day (QID) | INTRAMUSCULAR | Status: DC | PRN

## 2019-01-26 MED ORDER — SODIUM CHLORIDE 0.9 % IV SOLN
8.0000 mg/h | INTRAVENOUS | Status: DC
  Administered 2019-01-26 – 2019-01-28 (×3): 8 mg/h via INTRAVENOUS
  Filled 2019-01-26 (×6): qty 80

## 2019-01-26 MED ORDER — SODIUM CHLORIDE 0.9 % IV SOLN: 10.0000 mL/h | Freq: Once | INTRAVENOUS | Status: DC

## 2019-01-26 NOTE — ED Provider Notes (Signed)
[facility] Provider Note   CSN: [id number] Arrival date & time: 01/26/19  1039     History   Chief Complaint Chief Complaint  Patient presents with  . Anemia  . GI Bleeding    HPI Dustin Carter is a 63 y.o. male.     Patient is a 63 year old male who presents with low hemoglobin.  He states he was due to have a physical from his PCP and went for routine blood work yesterday.  He was called today and was told that he had low hemoglobin and that he needs to come to the emergency room.  He states that over the last couple of weeks he has had increased fatigue.  He is felt lightheaded at times and dizzy.  He is also noted some dark stools for the last few weeks.  He denies any blood in his stools.  No vomiting blood.  He does have a history of peptic ulcer disease and states that his stomach is been acting up a little bit more over the last few weeks and hurts after he eats.  He does not take any NSAIDs.  He is not on blood thinners.  He does drink 1 shot of alcohol in night to help him sleep.  He has a longstanding history of peptic ulcer disease and had to get a blood transfusion many years ago when he was in his early 120s.     Past Medical History:  Diagnosis Date  . Arthritis    SPINE AND "HEAD TO TOES"  . Atypical chest pain at rest   cardiologist-- dr berry-- work up done per note all normal (stress test and cardiac cath)  . BPH (benign prostatic hypertrophy) Mar 29, 2011   TREATED IN DR. TANNENBAUM'S OFFICE WITH "HEAT" THERAPY TO SHRINK THE PROSTATE  . DOE (dyspnea on exertion)   . Family history of adverse reaction to anesthesia    MOTHER--- HARD TO WAKE  . Full dentures   . GERD (gastroesophageal reflux disease)   . History of kidney stones   . Hyperlipidemia   . Hypertension   . Polyneuropathy    DUE TO POST SURGERY'S  . Renal calculi    bilateral   . Sinus tachycardia cardiologist- dr berry   hx palpitations w/ holter event  monitor showed SR, SB, runs of PSVT-- pt takes beta blocker  . Wears glasses     Patient Active Problem List   Diagnosis Date Noted  . Acute GI bleeding 01/26/2019  . Acute blood loss anemia 01/26/2019  . Essential hypertension 01/26/2019  . Palpitations 05/17/2017  . Chest pain at rest 08/30/2016  . HCAP (healthcare-associated pneumonia) 04/01/2012  . Sinus tachycardia 04/01/2012  . Coffee ground emesis 04/01/2012  . Dehydration 04/01/2012  . GERD (gastroesophageal reflux disease) 04/01/2012  . Chest pain 04/01/2012  . Hyperlipidemia 04/01/2012  . BPH (benign prostatic hyperplasia) 04/01/2012    Past Surgical History:  Procedure Laterality Date  . ANTERIOR CERVICAL DECOMP/DISCECTOMY FUSION  03/09/2004;  04-29-2008   C5-6 (03-09-2004)  C3-4 (04-29-2008  . CARDIOVASCULAR STRESS TEST  08-30-2016  dr berry   Low risk nuclear study w/ normal perfusion and mild reduced LV global systolic function (study was gated , no percentage on results) recommend echo  . CERVICAL FUSION  11/01/2005   C6-7 diskecotmy, decompression and fusion/  removal hardware C5-6  . CHOLECYSTECTOMY  12/29/2011   Procedure: LAPAROSCOPIC CHOLECYSTECTOMY WITH INTRAOPERATIVE CHOLANGIOGRAM;  Surgeon: Atilano InaEric M Wilson, MD,FACS;  Location: [facility];  Service: General;  Laterality: N/A;  . CYSTOSCOPY W/ URETERAL STENT PLACEMENT Left 12-05-1999;  01-02-2000  . CYSTOSCOPY/RETROGRADE/URETEROSCOPY/STONE EXTRACTION WITH BASKET Left 04/15/2014   Procedure: CYSTOSCOPY,  LEFT RETROGRADE PYELOGRAM,  LEFT URETEROSCOPY,  LEFT STONE EXTRACTION WITH BASKET, LEFT STENT PLACEMENT ;  Surgeon: Ailene Rud, MD;  Location: [facility];  Service: Urology;  Laterality: Left;  . CYSTOSCOPY/RETROGRADE/URETEROSCOPY/STONE EXTRACTION WITH BASKET Bilateral left 12-14-1999;  right 11-10-2007  dr Gaynelle Arabian  . CYSTOSCOPY/URETEROSCOPY/HOLMIUM LASER/STENT PLACEMENT Bilateral 12/29/2016   Procedure: CYSTOSCOPY RIGHT URETEROSCOPY POSSIBLE LEFT  WITH HOLMIUM  LASER AND URETERAL STENT PLACEMENT;  Surgeon: Lucas Mallow, MD;  Location: [facility];  Service: Urology;  Laterality: Bilateral;  . HOLMIUM LASER APPLICATION Left 51/76/1607   Procedure: HOLMIUM LASER APPLICATION;  Surgeon: Ailene Rud, MD;  Location: [facility];  Service: Urology;  Laterality: Left;  . IRRIGATION AND DEBRIDEMENT ABSCESS  07/27/2009   left index finger  . LEFT HEART CATHETERIZATION WITH CORONARY ANGIOGRAM N/A 11/02/2011   Procedure: LEFT HEART CATHETERIZATION WITH CORONARY ANGIOGRAM;  Surgeon: Lorretta Harp, MD;  Location: [facility] CATH LAB;  Service: Cardiovascular;  Laterality: N/A;  normal coronary arteries and lvf  . LUMBAR DISC SURGERY  10-15-2008  dr hirsch   redo laminectomy L4-5 w/ fusion/  revision L3-5 fusion  . LUMBAR FUSION  06-29-2001  dr hirsch   L3-4 diskectomy/ L1-2 lam. & disk./  fusion L1-4/  open reduction and internal fixation spondylolisthesis L2-3  . LUMBAR LAMINECTOMY/DECOMPRESSION MICRODISCECTOMY  02/29/2012   Procedure: LUMBAR LAMINECTOMY/DECOMPRESSION MICRODISCECTOMY 1 LEVEL;  Surgeon: Otilio Connors, MD;  Location: [facility];  Service: Neurosurgery;  Laterality: Right;  Right Lumbar five-sacral oneDiskectomy  . [place]  . POSTERIOR FUSION CERVICAL SPINE  06/06/2006   C6-7 and C7-T1  . SPINE SURGERY  05-24-2000  dr hirsch   L3-4 laminectomy/ diskectomy;  T9-10 diskectomy/ fusion  . TONSILLECTOMY AND ADENOIDECTOMY  age 55  . TOTAL SHOULDER ARTHROPLASTY Left 01-08-2010  dr [facility]  . TRANSTHORACIC ECHOCARDIOGRAM  09-10-2016   dr berry   ef 60-65%/  mild AV calcification without stenosis, no regurg./  mild MR and TR/ mild LAE        Home Medications    Prior to Admission medications   Medication Sig Start Date End Date Taking? Authorizing Provider  amitriptyline (ELAVIL) 150 MG tablet Take 150 mg by mouth at bedtime.   Yes [provider]  aspirin EC 81 MG EC tablet Take 1 tablet (81 mg  total) by mouth at bedtime. 08/31/16  Yes Reino Bellis B, NP  atorvastatin (LIPITOR) 40 MG tablet Take 40 mg by mouth at bedtime.   Yes [provider]  cyclobenzaprine (FLEXERIL) 10 MG tablet Take 10 mg by mouth at bedtime. For muscle spasms   Yes [provider]  gabapentin (NEURONTIN) 300 MG capsule Take 300 mg by mouth 3 (three) times daily.    Yes [provider]  losartan (COZAAR) 50 MG tablet TAKE 1 TABLET BY MOUTH DAILY 04/17/18  Yes Lorretta Harp, MD  meloxicam (MOBIC) 15 MG tablet Take 15 mg by mouth every morning.    Yes [provider]  metoprolol succinate (TOPROL-XL) 100 MG 24 hr tablet TAKE 1 TABLET BY MOUTH EVERY MORNING WITH OR IMMEDIATELY FOLLOWING A MEAL Patient taking differently: Take 100 mg by mouth daily.  07/11/17  Yes Lorretta Harp, MD  oxyCODONE-acetaminophen (PERCOCET) 7.5-325 MG per tablet Take 1  tablet by mouth every 4 (four) hours as needed for pain.   Yes [provider]  pantoprazole (PROTONIX) 40 MG tablet Take 40 mg by mouth every morning.    Yes [provider]    Family History Family History  Problem Relation Age of Onset  . Heart disease Father   . Hyperlipidemia Father   . Heart disease Maternal Grandmother   . Stroke Paternal Grandfather   . Heart disease Brother   . Hyperlipidemia Brother   . Cancer Maternal Aunt        brain  . Heart disease Maternal Uncle   . Cancer Paternal Uncle        liver  . Heart disease Brother   . Hyperlipidemia Brother   . Heart disease Maternal Uncle     Social History Social History   Tobacco Use  . Smoking status: Former Smoker    Packs/day: 1.00    Years: 12.00    Pack years: 12.00    Types: Cigarettes    Quit date: 04/26/1992    Years since quitting: 26.7  . Smokeless tobacco: Former NeurosurgeonUser    Types: Chew    Quit date: 04/26/1980  . Tobacco comment: used chew for 12 years before started smoking  Substance Use Topics  . Alcohol use: Yes     Comment: has one shot of Vodka before bed  . Drug use: No     Allergies   Butrans [buprenorphine] and Other   Review of Systems Review of Systems  Constitutional: Positive for fatigue. Negative for chills, diaphoresis and fever.  HENT: Negative for congestion, rhinorrhea and sneezing.   Eyes: Negative.   Respiratory: Negative for cough, chest tightness and shortness of breath.   Cardiovascular: Negative for chest pain and leg swelling.  Gastrointestinal: Positive for abdominal pain (epigastric pain after eating). Negative for blood in stool, diarrhea, nausea and vomiting.  Genitourinary: Negative for difficulty urinating, flank pain, frequency and hematuria.  Musculoskeletal: Negative for arthralgias and back pain.  Skin: Negative for rash.  Neurological: Positive for dizziness and light-headedness. Negative for speech difficulty, weakness, numbness and headaches.     Physical Exam Updated Vital Signs BP 135/77   Pulse 95   Temp 98.4 F (36.9 C) (Oral)   Resp 20   SpO2 99%   Physical Exam Constitutional:      Appearance: He is well-developed.  HENT:     Head: Normocephalic and atraumatic.  Eyes:     Pupils: Pupils are equal, round, and reactive to light.  Neck:     Musculoskeletal: Normal range of motion and neck supple.  Cardiovascular:     Rate and Rhythm: Normal rate and regular rhythm.     Heart sounds: Normal heart sounds.  Pulmonary:     Effort: Pulmonary effort is normal. No respiratory distress.     Breath sounds: Normal breath sounds. No wheezing or rales.  Chest:     Chest wall: No tenderness.  Abdominal:     General: Bowel sounds are normal.     Palpations: Abdomen is soft.     Tenderness: There is no abdominal tenderness. There is no guarding or rebound.  Genitourinary:    Comments: There is no gross blood on exam although there is limited stool on rectal exam. Musculoskeletal: Normal range of motion.  Lymphadenopathy:     Cervical: No cervical  adenopathy.  Skin:    General: Skin is warm and dry.     Findings: No rash.  Neurological:  Mental Status: He is alert and oriented to person, place, and time.      ED Treatments / Results  Labs (all labs ordered are listed, but only abnormal results are displayed) Labs Reviewed  COMPREHENSIVE METABOLIC PANEL - Abnormal; Notable for the following components:      Result Value   Calcium 8.6 (*)    Total Protein 5.8 (*)    Albumin 3.1 (*)    All other components within normal limits  CBC - Abnormal; Notable for the following components:   RBC 3.90 (*)    Hemoglobin 7.9 (*)    HCT 28.4 (*)    MCV 72.8 (*)    MCH 20.3 (*)    MCHC 27.8 (*)    RDW 16.7 (*)    All other components within normal limits  SARS CORONAVIRUS 2 (TAT 6-24 HRS)  SARS CORONAVIRUS 2 (HOSPITAL ORDER, PERFORMED IN [facility])  CBC  CBC  CBC  HIV ANTIBODY (ROUTINE TESTING W REFLEX)  HIV4GL SAVE TUBE  BASIC METABOLIC PANEL  POC OCCULT BLOOD, ED  CBG MONITORING, ED  TYPE AND SCREEN  PREPARE RBC (CROSSMATCH)    EKG None  Radiology No results found.  Procedures Procedures (including critical care time)  Medications Ordered in ED Medications  0.9 %  sodium chloride infusion (has no administration in time range)  pantoprazole (PROTONIX) 80 mg in sodium chloride 0.9 % 250 mL (0.32 mg/mL) infusion (has no administration in time range)  pantoprazole (PROTONIX) injection 40 mg (has no administration in time range)  labetalol (NORMODYNE) injection 5 mg (has no administration in time range)  acetaminophen (TYLENOL) tablet 650 mg (has no administration in time range)    Or  acetaminophen (TYLENOL) suppository 650 mg (has no administration in time range)  ondansetron (ZOFRAN) tablet 4 mg (has no administration in time range)    Or  ondansetron (ZOFRAN) injection 4 mg (has no administration in time range)  pantoprazole (PROTONIX) injection 40 mg (40 mg Intravenous Given 01/26/19 1815)      Initial Impression / Assessment and Plan / ED Course  I have reviewed the triage vital signs and the nursing notes.  Pertinent labs & imaging results that were available during my care of the patient were reviewed by me and considered in my medical decision making (see chart for details).        Patient is a 63 year old male who presents with melena associated with anemia.  His Hemoccult was negative although there was little stool present.  His hemoglobin is markedly low.  He was typed and crossed for 1 unit packed red cells.  He was also given Protonix.  He is hemodynamically stable.  I spoke with Dr. Toniann Fail who will admit the patient for further treatment.  CRITICAL CARE Performed by: Rolan Bucco Total critical care time: 45 minutes Critical care time was exclusive of separately billable procedures and treating other patients. Critical care was necessary to treat or prevent imminent or life-threatening deterioration. Critical care was time spent personally by me on the following activities: development of treatment plan with patient and/or surrogate as well as nursing, discussions with consultants, evaluation of patient's response to treatment, examination of patient, obtaining history from patient or surrogate, ordering and performing treatments and interventions, ordering and review of laboratory studies, ordering and review of radiographic studies, pulse oximetry and re-evaluation of patient's condition.   Final Clinical Impressions(s) / ED Diagnoses   Final diagnoses:  Melena  Iron deficiency anemia, unspecified iron  deficiency anemia type    ED Discharge Orders    None       Rolan Bucco, MD 01/26/19 [phone]

## 2019-01-26 NOTE — ED Notes (Signed)
Patient is being discharged from the Urgent [place] and sent to the Emergency Department via personal vehicle by self. Per Dr Meda Coffee, patient is stable but in need of higher level of care due to abnormal labs & symptoms. Patient is aware and verbalizes understanding of plan of care. There were no vitals filed for this visit.

## 2019-01-26 NOTE — H&P (Addendum)
History and Physical    Dustin Carter:[id number] DOB: 1955-11-02 DOA: 01/26/2019  PCP: Merri Brunette, MD  Patient coming from: Home.  Chief Complaint: Low hemoglobin.  HPI: Dustin Carter is a 63 y.o. male with history of hypertension, hyperlipidemia, chronic pain was referred to the ER after patient's hemoglobin was found to be low in his routine labs.  Patient states that he had a routine labs done yesterday by his primary care physician before his office visit which showed low hemoglobin and was advised to come to the ER.  Patient states over the last 1 month he has been feeling weak and fatigue on exertion and also noticed black stools.  Patient is on Mobic and aspirin.  Takes Mobic for chronic pain.  Drinks a shot of vodka every night.  Patient states he has had EGD about 5 to 6 years ago for reflux.  It was done by Dustin Carter and results are not in our system.  Patient denies any abdominal pain but did have some burning chest discomfort off and on for last few days.  ED Course: In the ER patient is hemodynamically stable hemoglobin is around 7.9 the last have a level in our system was 12.2 in September 2018.  Given the symptoms patient was given 1 unit of PRBC transfusion.  Patient admitted for acute Carter bleed secondary to most likely upper Carter given the melanotic stools.  Rest of the labs show platelets of 215 creatinine 1.1.  Review of Systems: As per HPI, rest all negative.   Past Medical History:  Diagnosis Date  . Arthritis    SPINE AND "HEAD TO TOES"  . Atypical chest pain at rest   cardiologist-- dr berry-- work up done per note all normal (stress test and cardiac cath)  . BPH (benign prostatic hypertrophy) Mar 29, 2011   TREATED IN DR. TANNENBAUM'S OFFICE WITH "HEAT" THERAPY TO SHRINK THE PROSTATE  . DOE (dyspnea on exertion)   . Family history of adverse reaction to anesthesia    MOTHER--- HARD TO WAKE  . Full dentures   . GERD (gastroesophageal reflux disease)   . History  of kidney stones   . Hyperlipidemia   . Hypertension   . Polyneuropathy    DUE TO POST SURGERY'S  . Renal calculi    bilateral   . Sinus tachycardia cardiologist- dr berry   hx palpitations w/ holter event monitor showed SR, SB, runs of PSVT-- pt takes beta blocker  . Wears glasses     Past Surgical History:  Procedure Laterality Date  . ANTERIOR CERVICAL DECOMP/DISCECTOMY FUSION  03/09/2004;  04-29-2008   C5-6 (03-09-2004)  C3-4 (04-29-2008  . CARDIOVASCULAR STRESS TEST  08-30-2016  dr berry   Low risk nuclear study w/ normal perfusion and mild reduced LV global systolic function (study was gated , no percentage on results) recommend echo  . CERVICAL FUSION  11/01/2005   C6-7 diskecotmy, decompression and fusion/  removal hardware C5-6  . CHOLECYSTECTOMY  12/29/2011   Procedure: LAPAROSCOPIC CHOLECYSTECTOMY WITH INTRAOPERATIVE CHOLANGIOGRAM;  Surgeon: Atilano Ina, MD,FACS;  Location: [facility];  Service: General;  Laterality: N/A;  . CYSTOSCOPY W/ URETERAL STENT PLACEMENT Left 12-05-1999;  01-02-2000  . CYSTOSCOPY/RETROGRADE/URETEROSCOPY/STONE EXTRACTION WITH BASKET Left 04/15/2014   Procedure: CYSTOSCOPY,  LEFT RETROGRADE PYELOGRAM,  LEFT URETEROSCOPY,  LEFT STONE EXTRACTION WITH BASKET, LEFT STENT PLACEMENT ;  Surgeon: Dustin Ludwig, MD;  Location: [facility];  Service: Urology;  Laterality: Left;  . CYSTOSCOPY/RETROGRADE/URETEROSCOPY/STONE EXTRACTION  WITH BASKET Bilateral left 12-14-1999;  right 11-10-2007  dr Patsi Sears  . CYSTOSCOPY/URETEROSCOPY/HOLMIUM LASER/STENT PLACEMENT Bilateral 12/29/2016   Procedure: CYSTOSCOPY RIGHT URETEROSCOPY POSSIBLE LEFT  WITH HOLMIUM LASER AND URETERAL STENT PLACEMENT;  Surgeon: Dustin Elliot, MD;  Location: [facility];  Service: Urology;  Laterality: Bilateral;  . HOLMIUM LASER APPLICATION Left 04/15/2014   Procedure: HOLMIUM LASER APPLICATION;  Surgeon: Dustin Ludwig, MD;  Location: [facility];  Service: Urology;  Laterality:  Left;  . IRRIGATION AND DEBRIDEMENT ABSCESS  07/27/2009   left index finger  . LEFT HEART CATHETERIZATION WITH CORONARY ANGIOGRAM N/A 11/02/2011   Procedure: LEFT HEART CATHETERIZATION WITH CORONARY ANGIOGRAM;  Surgeon: Dustin Gess, MD;  Location: [facility] CATH LAB;  Service: Cardiovascular;  Laterality: N/A;  normal coronary arteries and lvf  . LUMBAR DISC SURGERY  10-15-2008  dr hirsch   redo laminectomy L4-5 w/ fusion/  revision L3-5 fusion  . LUMBAR FUSION  06-29-2001  dr hirsch   L3-4 diskectomy/ L1-2 lam. & disk./  fusion L1-4/  open reduction and internal fixation spondylolisthesis L2-3  . LUMBAR LAMINECTOMY/DECOMPRESSION MICRODISCECTOMY  02/29/2012   Procedure: LUMBAR LAMINECTOMY/DECOMPRESSION MICRODISCECTOMY 1 LEVEL;  Surgeon: Dustin Fake, MD;  Location: [facility];  Service: Neurosurgery;  Laterality: Right;  Right Lumbar five-sacral oneDiskectomy  . LUMBAR SPINE SURGERY  1997  . POSTERIOR FUSION CERVICAL SPINE  06/06/2006   C6-7 and C7-T1  . SPINE SURGERY  05-24-2000  dr hirsch   L3-4 laminectomy/ diskectomy;  T9-10 diskectomy/ fusion  . TONSILLECTOMY AND ADENOIDECTOMY  age 1  . TOTAL SHOULDER ARTHROPLASTY Left 01-08-2010  dr supple [facility]  . TRANSTHORACIC ECHOCARDIOGRAM  09-10-2016   dr berry   ef 60-65%/  mild AV calcification without stenosis, no regurg./  mild MR and TR/ mild LAE     reports that he quit smoking about 26 years ago. His smoking use included cigarettes. He has a 12.00 pack-year smoking history. He quit smokeless tobacco use about 38 years ago.  His smokeless tobacco use included chew. He reports current alcohol use. He reports that he does not use drugs.  Allergies  Allergen Reactions  . Butrans [Buprenorphine] Itching       . Other Itching    Pain patch (unsure of name)    Family History  Problem Relation Age of Onset  . Heart disease Father   . Hyperlipidemia Father   . Heart disease Maternal Grandmother   . Stroke Paternal Grandfather   . Heart  disease Brother   . Hyperlipidemia Brother   . Cancer Maternal Aunt        brain  . Heart disease Maternal Uncle   . Cancer Paternal Uncle        liver  . Heart disease Brother   . Hyperlipidemia Brother   . Heart disease Maternal Uncle     Prior to Admission medications   Medication Sig Start Date End Date Taking? Authorizing Provider  amitriptyline (ELAVIL) 150 MG tablet Take 150 mg by mouth at bedtime.   Yes [provider]  aspirin EC 81 MG EC tablet Take 1 tablet (81 mg total) by mouth at bedtime. 08/31/16  Yes Laverda Page B, NP  atorvastatin (LIPITOR) 40 MG tablet Take 40 mg by mouth at bedtime.   Yes [provider]  cyclobenzaprine (FLEXERIL) 10 MG tablet Take 10 mg by mouth at bedtime. For muscle spasms   Yes [provider]  gabapentin (NEURONTIN) 300 MG capsule Take 300 mg  by mouth 3 (three) times daily.    Yes [provider]  losartan (COZAAR) 50 MG tablet TAKE 1 TABLET BY MOUTH DAILY 04/17/18  Yes Dustin GessBerry, Jonathan J, MD  meloxicam (MOBIC) 15 MG tablet Take 15 mg by mouth every morning.    Yes [provider]  metoprolol succinate (TOPROL-XL) 100 MG 24 hr tablet TAKE 1 TABLET BY MOUTH EVERY MORNING WITH OR IMMEDIATELY FOLLOWING A MEAL Patient taking differently: Take 100 mg by mouth daily.  07/11/17  Yes Dustin GessBerry, Jonathan J, MD  oxyCODONE-acetaminophen (PERCOCET) 7.5-325 MG per tablet Take 1 tablet by mouth every 4 (four) hours as needed for pain.   Yes [provider]  pantoprazole (PROTONIX) 40 MG tablet Take 40 mg by mouth every morning.    Yes [provider]    Physical Exam: Constitutional: Moderately built and nourished. Vitals:   01/26/19 2042 01/26/19 2045 01/26/19 2100 01/26/19 2115  BP:  (!) 146/75 (!) 145/76 140/73  Pulse:  82 84 80  Resp:  11 13 16   Temp: 98.4 F (36.9 C)     TempSrc: Oral     SpO2:  99% 100% 100%   Eyes: Anicteric no pallor. ENMT: No discharge from the ears eyes nose or  mouth. Neck: No mass felt.  No neck rigidity. Respiratory: No rhonchi or crepitations. Cardiovascular: S1-S2 heard. Abdomen: Soft nontender bowel sounds present. Musculoskeletal: No edema.  No joint effusion. Skin: No rash. Neurologic: Alert awake oriented to time place and person.  Moves all extremities. Psychiatric: Appears normal per normal affect.   Labs on Admission: I have personally reviewed following labs and imaging studies  CBC: Recent Labs  Lab 01/26/19 1143  WBC 6.7  HGB 7.9*  HCT 28.4*  MCV 72.8*  PLT 205   Basic Metabolic Panel: Recent Labs  Lab 01/26/19 1143  NA 138  K 4.0  CL 100  CO2 29  GLUCOSE 98  BUN 10  CREATININE 1.15  CALCIUM 8.6*   GFR: CrCl cannot be calculated (Unknown ideal weight.). Liver Function Tests: Recent Labs  Lab 01/26/19 1143  AST 16  ALT 15  ALKPHOS 103  BILITOT 0.4  PROT 5.8*  ALBUMIN 3.1*   No results for input(s): LIPASE, AMYLASE in the last 168 hours. No results for input(s): AMMONIA in the last 168 hours. Coagulation Profile: No results for input(s): INR, PROTIME in the last 168 hours. Cardiac Enzymes: No results for input(s): CKTOTAL, CKMB, CKMBINDEX, TROPONINI in the last 168 hours. BNP (last 3 results) No results for input(s): PROBNP in the last 8760 hours. HbA1C: No results for input(s): HGBA1C in the last 72 hours. CBG: Recent Labs  Lab 01/26/19 2225  GLUCAP 88   Lipid Profile: No results for input(s): CHOL, HDL, LDLCALC, TRIG, CHOLHDL, LDLDIRECT in the last 72 hours. Thyroid Function Tests: No results for input(s): TSH, T4TOTAL, FREET4, T3FREE, THYROIDAB in the last 72 hours. Anemia Panel: No results for input(s): VITAMINB12, FOLATE, FERRITIN, TIBC, IRON, RETICCTPCT in the last 72 hours. Urine analysis:    Component Value Date/Time   COLORURINE YELLOW 04/01/2012 1833   APPEARANCEUR CLEAR 04/01/2012 1833   LABSPEC 1.007 04/01/2012 1833   PHURINE 6.0 04/01/2012 1833   GLUCOSEU NEGATIVE  04/01/2012 1833   HGBUR NEGATIVE 04/01/2012 1833   BILIRUBINUR NEGATIVE 04/01/2012 1833   KETONESUR NEGATIVE 04/01/2012 1833   PROTEINUR NEGATIVE 04/01/2012 1833   UROBILINOGEN 0.2 04/01/2012 1833   NITRITE NEGATIVE 04/01/2012 1833   LEUKOCYTESUR NEGATIVE 04/01/2012 1833   Sepsis Labs: @  LABRCNTIP(procalcitonin:4,lacticidven:4) )No results found for this or any previous visit (from the past 240 hour(s)).   Radiological Exams on Admission: No results found.    Assessment/Plan Principal Problem:   Acute Carter bleeding Active Problems:   GERD (gastroesophageal reflux disease)   Acute blood loss anemia   Essential hypertension    1. Acute Carter bleed likely could be upper Carter given that patient has melanotic stools -likely from NSAIDs patient uses meloxicam chronically.  Also on aspirin.  Both medicines on hold.  Patient only received 1 unit of PRBC.  Will check serial CBCs.  On Protonix infusion.  Consult [facility] in the morning. 2. Acute blood loss anemia follow CBC.  1 unit of PRBC was transfused. 3. Hypertension -since patient is n.p.o. we will keep patient on PRN IV labetalol. 4. History of hyperlipidemia presently n.p.o. 5. History of chronic pain.  Patient states he does not drink or call small amount every night to sleep.  Closely watch out for any withdrawal signs.   DVT prophylaxis: SCDs. Code Status: Full code. Family Communication: Discussed with patient. Disposition Plan: Home. Consults called: None. Admission status: Observation.   Rise Patience MD [facility] Pager [phone].  If 7PM-7AM, please contact night-coverage [email or web address] Password [email or web address]  01/26/2019, 11:05 PM

## 2019-01-26 NOTE — ED Triage Notes (Signed)
Pt to ER for evaluation of anemia hgb 8.7, reports sent from PCP. Reports 1 month dark stools with epigastric discomfort, takes aspirin daily. He is in NAD today. Does report dizziness with standing.

## 2019-01-27 DIAGNOSIS — Z20828 Contact with and (suspected) exposure to other viral communicable diseases: Secondary | ICD-10-CM | POA: Diagnosis present

## 2019-01-27 DIAGNOSIS — K922 Gastrointestinal hemorrhage, unspecified: Secondary | ICD-10-CM | POA: Diagnosis not present

## 2019-01-27 DIAGNOSIS — Z823 Family history of stroke: Secondary | ICD-10-CM | POA: Diagnosis not present

## 2019-01-27 DIAGNOSIS — K264 Chronic or unspecified duodenal ulcer with hemorrhage: Secondary | ICD-10-CM | POA: Diagnosis present

## 2019-01-27 DIAGNOSIS — Z79899 Other long term (current) drug therapy: Secondary | ICD-10-CM | POA: Diagnosis not present

## 2019-01-27 DIAGNOSIS — Z791 Long term (current) use of non-steroidal anti-inflammatories (NSAID): Secondary | ICD-10-CM | POA: Diagnosis not present

## 2019-01-27 DIAGNOSIS — Z7982 Long term (current) use of aspirin: Secondary | ICD-10-CM | POA: Diagnosis not present

## 2019-01-27 DIAGNOSIS — Z8249 Family history of ischemic heart disease and other diseases of the circulatory system: Secondary | ICD-10-CM | POA: Diagnosis not present

## 2019-01-27 DIAGNOSIS — D509 Iron deficiency anemia, unspecified: Secondary | ICD-10-CM | POA: Diagnosis not present

## 2019-01-27 DIAGNOSIS — K219 Gastro-esophageal reflux disease without esophagitis: Secondary | ICD-10-CM | POA: Diagnosis present

## 2019-01-27 DIAGNOSIS — K921 Melena: Secondary | ICD-10-CM

## 2019-01-27 DIAGNOSIS — Z8349 Family history of other endocrine, nutritional and metabolic diseases: Secondary | ICD-10-CM | POA: Diagnosis not present

## 2019-01-27 DIAGNOSIS — Z79891 Long term (current) use of opiate analgesic: Secondary | ICD-10-CM | POA: Diagnosis not present

## 2019-01-27 DIAGNOSIS — K254 Chronic or unspecified gastric ulcer with hemorrhage: Secondary | ICD-10-CM | POA: Diagnosis present

## 2019-01-27 DIAGNOSIS — I10 Essential (primary) hypertension: Secondary | ICD-10-CM | POA: Diagnosis not present

## 2019-01-27 DIAGNOSIS — G8929 Other chronic pain: Secondary | ICD-10-CM | POA: Diagnosis present

## 2019-01-27 DIAGNOSIS — E785 Hyperlipidemia, unspecified: Secondary | ICD-10-CM | POA: Diagnosis present

## 2019-01-27 DIAGNOSIS — D62 Acute posthemorrhagic anemia: Secondary | ICD-10-CM | POA: Diagnosis not present

## 2019-01-27 DIAGNOSIS — M549 Dorsalgia, unspecified: Secondary | ICD-10-CM | POA: Diagnosis not present

## 2019-01-27 LAB — CBC
HCT: 31.8 % — ABNORMAL LOW (ref 39.0–52.0)
HCT: 31.1 % — ABNORMAL LOW (ref 39.0–52.0)
Hemoglobin: 9.1 g/dL — ABNORMAL LOW (ref 13.0–17.0)
Hemoglobin: 9.4 g/dL — ABNORMAL LOW (ref 13.0–17.0)
MCH: 20.9 pg — ABNORMAL LOW (ref 26.0–34.0)
MCH: 21.7 pg — ABNORMAL LOW (ref 26.0–34.0)
MCHC: 28.6 g/dL — ABNORMAL LOW (ref 30.0–36.0)
MCHC: 30.2 g/dL (ref 30.0–36.0)
MCV: 73.1 fL — ABNORMAL LOW (ref 80.0–100.0)
MCV: 71.7 fL — ABNORMAL LOW (ref 80.0–100.0)
Platelets: 226 10*3/uL (ref 150–400)
Platelets: 225 10*3/uL (ref 150–400)
RBC: 4.35 MIL/uL (ref 4.22–5.81)
RBC: 4.34 MIL/uL (ref 4.22–5.81)
RDW: 16.8 % — ABNORMAL HIGH (ref 11.5–15.5)
RDW: 16.8 % — ABNORMAL HIGH (ref 11.5–15.5)
WBC: 7.8 10*3/uL (ref 4.0–10.5)
WBC: 7.3 10*3/uL (ref 4.0–10.5)
nRBC: 0 % (ref 0.0–0.2)
nRBC: 0 % (ref 0.0–0.2)

## 2019-01-27 LAB — TYPE AND SCREEN
ABO/RH(D): O POS
Antibody Screen: NEGATIVE
Unit division: 0

## 2019-01-27 LAB — BPAM RBC
Blood Product Expiration Date: 202010262359
ISSUE DATE / TIME: 202010021834
Unit Type and Rh: 5100

## 2019-01-27 LAB — BASIC METABOLIC PANEL
Anion gap: 10 (ref 5–15)
BUN: 9 mg/dL (ref 8–23)
CO2: 25 mmol/L (ref 22–32)
Calcium: 8.5 mg/dL — ABNORMAL LOW (ref 8.9–10.3)
Chloride: 104 mmol/L (ref 98–111)
Creatinine, Ser: 0.91 mg/dL (ref 0.61–1.24)
GFR calc Af Amer: >60 mL/min (ref >60)
GFR calc non Af Amer: >60 mL/min (ref >60)
Glucose, Bld: 104 mg/dL — ABNORMAL HIGH (ref 70–99)
Potassium: 4.4 mmol/L (ref 3.5–5.1)
Sodium: 139 mmol/L (ref 135–145)

## 2019-01-27 LAB — GLUCOSE, CAPILLARY
Glucose-Capillary: 90 mg/dL (ref 70–99)
Glucose-Capillary: 83 mg/dL (ref 70–99)

## 2019-01-27 LAB — SARS CORONAVIRUS 2 (TAT 6-24 HRS): SARS Coronavirus 2: NEGATIVE

## 2019-01-27 LAB — HIV ANTIBODY (ROUTINE TESTING W REFLEX): HIV Screen 4th Generation wRfx: NONREACTIVE

## 2019-01-27 MED ORDER — SODIUM CHLORIDE 0.9 % IV SOLN
INTRAVENOUS | Status: DC
  Administered 2019-01-28: 01:00:00 via INTRAVENOUS

## 2019-01-27 MED ORDER — CYCLOBENZAPRINE HCL 10 MG PO TABS
10.0000 mg | ORAL_TABLET | Freq: Every day | ORAL | Status: DC
  Administered 2019-01-27: 22:00:00 10 mg via ORAL
  Filled 2019-01-27: qty 1

## 2019-01-27 MED ORDER — OXYCODONE-ACETAMINOPHEN 7.5-325 MG PO TABS
1.0000 | ORAL_TABLET | ORAL | Status: DC | PRN
  Administered 2019-01-27 – 2019-01-28 (×4): 1 via ORAL
  Filled 2019-01-27 (×4): qty 1

## 2019-01-27 MED ORDER — PANTOPRAZOLE SODIUM 40 MG PO TBEC: 40.0000 mg | DELAYED_RELEASE_TABLET | ORAL | Status: DC

## 2019-01-27 MED ORDER — AMITRIPTYLINE HCL 25 MG PO TABS
150.0000 mg | ORAL_TABLET | Freq: Every day | ORAL | Status: DC
  Administered 2019-01-27: 22:00:00 150 mg via ORAL
  Filled 2019-01-27: qty 6

## 2019-01-27 MED ORDER — GABAPENTIN 300 MG PO CAPS
300.0000 mg | ORAL_CAPSULE | Freq: Three times a day (TID) | ORAL | Status: DC
  Administered 2019-01-27 – 2019-01-28 (×4): 300 mg via ORAL
  Filled 2019-01-27 (×4): qty 1

## 2019-01-27 NOTE — Progress Notes (Signed)
Pt requesting pain medication, pt states he takes percocet at home  Informed MD via secure chat  Pt denies ice/heat therapy at this time  Will continue to monitor

## 2019-01-27 NOTE — Progress Notes (Addendum)
PROGRESS NOTE    Dustin Carter   JXB:[id number]  DOB: 03/16/1956  DOA: 01/26/2019 PCP: Merri Brunette, MD   Brief Narrative:  Dustin Carter is a 63 y.o. male with history of hypertension, hyperlipidemia, chronic pain was referred to the ER after patient's hemoglobin was found to be low in his routine labs. Patient states over the last 1 month he has been feeling weak and fatigue on exertion, upper abdominal pain and black stools.  The patient has chronic pain and takes both Mobic and aspirin.   He also drinks a shot of vodka every night.  Patient states he has had EGD about 5 to 6 years ago for reflux by [facility].   Subjective: The patient has not had any more black stools since being in the hospital.  He has no nausea.  He does have some upper abdominal pain which is not severe.    Assessment & Plan:   Principal Problem:   Acute GI bleeding-melena -Possibly peptic ulcer disease in relation to chronic NSAID use-he takes Protonix at home - Continue IV Protonix  -Continue clear liquids - I have spoken with GI, Dr. Evette Cristal in detail about a consultation on this patient  Active Problems: Acute blood loss anemia-microcytic anemia - Hemoglobin 7.9 has improved to 9.4 after 1 unit of packed red blood cells transfused yesterday -We will obtain an anemia panel in the morning    Essential hypertension and h/o palpitations - he wore a heart monitor which did not show any arrhythmias  -Hold metoprolol in setting of acute GI bleed  Chronic pain- back - resumed Oxycodone, Flexeril, Neurontin and Elavil   Time spent in minutes: 35 DVT prophylaxis: SCDs Code Status:  Full code Family Communication:  Disposition Plan: f/u on GI eval Consultants:   [facility] Procedures:   none Antimicrobials:  Anti-infectives (From admission, onward)   None       Objective: Vitals:   01/27/19 0100 01/27/19 0115 01/27/19 0200 01/27/19 0757  BP: (!) 143/75 (!) 148/75 (!) 148/81 (!) 142/82  Pulse:  80 81 81 79  Resp: 16 19 18 18   Temp:   97.9 F (36.6 C) 98 F (36.7 C)  TempSrc:   Oral Oral  SpO2: 98% 99% 100% 98%  Weight:   79.9 kg   Height:   5\' 9"  (1.753 m)     Intake/Output Summary (Last 24 hours) at 01/27/2019 1059 Last data filed at 01/27/2019 0829 Gross per 24 hour  Intake 518.22 ml  Output 600 ml  Net -81.78 ml   Filed Weights   01/27/19 0200  Weight: 79.9 kg    Examination: General exam: Appears comfortable  HEENT: PERRLA, oral mucosa moist, no sclera icterus or thrush Respiratory system: Clear to auscultation. Respiratory effort normal. Cardiovascular system: S1 & S2 heard, RRR.   Gastrointestinal system: Abdomen soft, non-tender, nondistended. Normal bowel sounds. Central nervous system: Alert and oriented. No focal neurological deficits. Extremities: No cyanosis, clubbing or edema Skin: No rashes or ulcers Psychiatry:  Mood & affect appropriate.   Data Reviewed: I have personally reviewed following labs and imaging studies  CBC: Recent Labs  Lab 01/26/19 1143 01/26/19 2300 01/27/19 0234 01/27/19 0800  WBC 6.7 7.6 7.8 7.3  HGB 7.9* 9.8* 9.1* 9.4*  HCT 28.4* 33.8* 31.8* 31.1*  MCV 72.8* 73.8* 73.1* 71.7*  PLT 205 215 226 225   Basic Metabolic Panel: Recent Labs  Lab 01/26/19 1143 01/27/19 0234  NA 138 139  K 4.0 4.4  CL 100 104  CO2 29 25  GLUCOSE 98 104*  BUN 10 9  CREATININE 1.15 0.91  CALCIUM 8.6* 8.5*   GFR: Estimated Creatinine Clearance: 80.9 mL/min (by C-G formula based on SCr of 0.91 mg/dL). Liver Function Tests: Recent Labs  Lab 01/26/19 1143  AST 16  ALT 15  ALKPHOS 103  BILITOT 0.4  PROT 5.8*  ALBUMIN 3.1*   No results for input(s): LIPASE, AMYLASE in the last 168 hours. No results for input(s): AMMONIA in the last 168 hours. Coagulation Profile: No results for input(s): INR, PROTIME in the last 168 hours. Cardiac Enzymes: No results for input(s): CKTOTAL, CKMB, CKMBINDEX, TROPONINI in the last 168 hours. BNP  (last 3 results) No results for input(s): PROBNP in the last 8760 hours. HbA1C: No results for input(s): HGBA1C in the last 72 hours. CBG: Recent Labs  Lab 01/26/19 2225 01/27/19 0809  GLUCAP 88 90   Lipid Profile: No results for input(s): CHOL, HDL, LDLCALC, TRIG, CHOLHDL, LDLDIRECT in the last 72 hours. Thyroid Function Tests: No results for input(s): TSH, T4TOTAL, FREET4, T3FREE, THYROIDAB in the last 72 hours. Anemia Panel: No results for input(s): VITAMINB12, FOLATE, FERRITIN, TIBC, IRON, RETICCTPCT in the last 72 hours. Urine analysis:    Component Value Date/Time   COLORURINE YELLOW 04/01/2012 1833   APPEARANCEUR CLEAR 04/01/2012 1833   LABSPEC 1.007 04/01/2012 1833   PHURINE 6.0 04/01/2012 1833   GLUCOSEU NEGATIVE 04/01/2012 1833   HGBUR NEGATIVE 04/01/2012 1833   BILIRUBINUR NEGATIVE 04/01/2012 1833   KETONESUR NEGATIVE 04/01/2012 1833   PROTEINUR NEGATIVE 04/01/2012 1833   UROBILINOGEN 0.2 04/01/2012 1833   NITRITE NEGATIVE 04/01/2012 1833   LEUKOCYTESUR NEGATIVE 04/01/2012 1833   Sepsis Labs: @LABRCNTIP (procalcitonin:4,lacticidven:4) ) Recent Results (from the past 240 hour(s))  SARS CORONAVIRUS 2 (TAT 6-24 HRS) Nasopharyngeal Nasopharyngeal Swab     Status: None   Collection Time: 01/26/19  4:49 PM   Specimen: Nasopharyngeal Swab  Result Value Ref Range Status   SARS Coronavirus 2 NEGATIVE NEGATIVE Final    Comment: (NOTE) SARS-CoV-2 target nucleic acids are NOT DETECTED. The SARS-CoV-2 RNA is generally detectable in upper and lower respiratory specimens during the acute phase of infection. Negative results do not preclude SARS-CoV-2 infection, do not rule out co-infections with other pathogens, and should not be used as the sole basis for treatment or other patient management decisions. Negative results must be combined with clinical observations, patient history, and epidemiological information. The expected result is Negative. Fact Sheet for  Patients: SugarRoll.be Fact Sheet for Healthcare Providers: https://www.woods-mathews.com/ This test is not yet approved or cleared by the [place] FDA and  has been authorized for detection and/or diagnosis of SARS-CoV-2 by FDA under an Emergency Use Authorization (EUA). This EUA will remain  in effect (meaning this test can be used) for the duration of the COVID-19 declaration under Section 56 4(b)(1) of the Act, 21 U.S.C. section 360bbb-3(b)(1), unless the authorization is terminated or revoked sooner. Performed at [facility], [place] [address], [place], [place] [address]          Radiology Studies: No results found.    Scheduled Meds: . amitriptyline  150 mg Oral QHS  . cyclobenzaprine  10 mg Oral QHS  . gabapentin  300 mg Oral TID  . pantoprazole  40 mg Oral [id number]  . [START ON 01/30/2019] pantoprazole  40 mg Intravenous Q12H   Continuous Infusions: . sodium chloride    . pantoprozole (PROTONIX) infusion 8 mg/hr (01/27/19 0935)  LOS: 0 days      Calvert Cantor, MD [facility] Pager: [email or web address] Password [email or web address] 01/27/2019, 10:59 AM

## 2019-01-27 NOTE — Consult Note (Signed)
Subjective:   HPI  The patient is a 63 year old male who was admitted to the hospital last evening with a complaint of feeling weak over the past month, intermittent melena over the past month, and he was found to have a low hemoglobin and hematocrit. He denies hematemesis or abdominal pain.  Review of Systems No chest pain or shortness of breath  Past Medical History:  Diagnosis Date  . Arthritis    SPINE AND "HEAD TO TOES"  . Atypical chest pain at rest   cardiologist-- dr berry-- work up done per note all normal (stress test and cardiac cath)  . BPH (benign prostatic hypertrophy) Mar 29, 2011   TREATED IN DR. TANNENBAUM'S OFFICE WITH "HEAT" THERAPY TO SHRINK THE PROSTATE  . DOE (dyspnea on exertion)   . Family history of adverse reaction to anesthesia    MOTHER--- HARD TO WAKE  . Full dentures   . GERD (gastroesophageal reflux disease)   . History of kidney stones   . Hyperlipidemia   . Hypertension   . Polyneuropathy    DUE TO POST SURGERY'S  . Renal calculi    bilateral   . Sinus tachycardia cardiologist- dr berry   hx palpitations w/ holter event monitor showed SR, SB, runs of PSVT-- pt takes beta blocker  . Wears glasses    Past Surgical History:  Procedure Laterality Date  . ANTERIOR CERVICAL DECOMP/DISCECTOMY FUSION  03/09/2004;  04-29-2008   C5-6 (03-09-2004)  C3-4 (04-29-2008  . CARDIOVASCULAR STRESS TEST  08-30-2016  dr berry   Low risk nuclear study w/ normal perfusion and mild reduced LV global systolic function (study was gated , no percentage on results) recommend echo  . CERVICAL FUSION  11/01/2005   C6-7 diskecotmy, decompression and fusion/  removal hardware C5-6  . CHOLECYSTECTOMY  12/29/2011   Procedure: LAPAROSCOPIC CHOLECYSTECTOMY WITH INTRAOPERATIVE CHOLANGIOGRAM;  Surgeon: Atilano Ina, MD,FACS;  Location: [facility];  Service: General;  Laterality: N/A;  . CYSTOSCOPY W/ URETERAL STENT PLACEMENT Left 12-05-1999;  01-02-2000  .  CYSTOSCOPY/RETROGRADE/URETEROSCOPY/STONE EXTRACTION WITH BASKET Left 04/15/2014   Procedure: CYSTOSCOPY,  LEFT RETROGRADE PYELOGRAM,  LEFT URETEROSCOPY,  LEFT STONE EXTRACTION WITH BASKET, LEFT STENT PLACEMENT ;  Surgeon: Kathi Ludwig, MD;  Location: [facility];  Service: Urology;  Laterality: Left;  . CYSTOSCOPY/RETROGRADE/URETEROSCOPY/STONE EXTRACTION WITH BASKET Bilateral left 12-14-1999;  right 11-10-2007  dr Patsi Sears  . CYSTOSCOPY/URETEROSCOPY/HOLMIUM LASER/STENT PLACEMENT Bilateral 12/29/2016   Procedure: CYSTOSCOPY RIGHT URETEROSCOPY POSSIBLE LEFT  WITH HOLMIUM LASER AND URETERAL STENT PLACEMENT;  Surgeon: Crista Elliot, MD;  Location: [facility];  Service: Urology;  Laterality: Bilateral;  . HOLMIUM LASER APPLICATION Left 04/15/2014   Procedure: HOLMIUM LASER APPLICATION;  Surgeon: Kathi Ludwig, MD;  Location: [facility];  Service: Urology;  Laterality: Left;  . IRRIGATION AND DEBRIDEMENT ABSCESS  07/27/2009   left index finger  . LEFT HEART CATHETERIZATION WITH CORONARY ANGIOGRAM N/A 11/02/2011   Procedure: LEFT HEART CATHETERIZATION WITH CORONARY ANGIOGRAM;  Surgeon: Runell Gess, MD;  Location: [facility] CATH LAB;  Service: Cardiovascular;  Laterality: N/A;  normal coronary arteries and lvf  . LUMBAR DISC SURGERY  10-15-2008  dr hirsch   redo laminectomy L4-5 w/ fusion/  revision L3-5 fusion  . LUMBAR FUSION  06-29-2001  dr hirsch   L3-4 diskectomy/ L1-2 lam. & disk./  fusion L1-4/  open reduction and internal fixation spondylolisthesis L2-3  . LUMBAR LAMINECTOMY/DECOMPRESSION MICRODISCECTOMY  02/29/2012   Procedure: LUMBAR LAMINECTOMY/DECOMPRESSION MICRODISCECTOMY 1 LEVEL;  Surgeon: Fayrene Fearing  Mickeal Skinner, MD;  [facility]: [facility];  Service: Neurosurgery;  Laterality: Right;  Right Lumbar five-sacral oneDiskectomy  . LUMBAR SPINE SURGERY  1997  . POSTERIOR FUSION CERVICAL SPINE  06/06/2006   C6-7 and C7-T1  . SPINE SURGERY  05-24-2000  dr hirsch   L3-4 laminectomy/  diskectomy;  T9-10 diskectomy/ fusion  . TONSILLECTOMY AND ADENOIDECTOMY  age 74  . TOTAL SHOULDER ARTHROPLASTY Left 01-08-2010  dr supple [facility]  . TRANSTHORACIC ECHOCARDIOGRAM  09-10-2016   dr berry   ef 60-65%/  mild AV calcification without stenosis, no regurg./  mild MR and TR/ mild LAE   Social History   Socioeconomic History  . Marital status: Single    Spouse name: Not on file  . Number of children: Not on file  . Years of education: Not on file  . Highest education level: Not on file  Occupational History  . Not on file  Social Needs  . Financial resource strain: Not on file  . Food insecurity    Worry: Not on file    Inability: Not on file  . Transportation needs    Medical: Not on file    Non-medical: Not on file  Tobacco Use  . Smoking status: Former Smoker    Packs/day: 1.00    Years: 12.00    Pack years: 12.00    Types: Cigarettes    Quit date: 04/26/1992    Years since quitting: 26.7  . Smokeless tobacco: Former Neurosurgeon    Types: Chew    Quit date: 04/26/1980  . Tobacco comment: used chew for 12 years before started smoking  Substance and Sexual Activity  . Alcohol use: Yes    Comment: has one shot of Vodka before bed  . Drug use: No  . Sexual activity: Never  Lifestyle  . Physical activity    Days per week: Not on file    Minutes per session: Not on file  . Stress: Not on file  Relationships  . Social Musician on phone: Not on file    Gets together: Not on file    Attends religious service: Not on file    Active member of club or organization: Not on file    Attends meetings of clubs or organizations: Not on file    Relationship status: Not on file  . Intimate partner violence    Fear of current or ex partner: Not on file    Emotionally abused: Not on file    Physically abused: Not on file    Forced sexual activity: Not on file  Other Topics Concern  . Not on file  Social History Narrative  . Not on file   family history includes  Cancer in his maternal aunt and paternal uncle; Heart disease in his brother, brother, father, maternal grandmother, maternal uncle, and maternal uncle; Hyperlipidemia in his brother, brother, and father; Stroke in his paternal grandfather.  Current Facility-Administered Medications:  .  0.9 %  sodium chloride infusion, 10 mL/hr, Intravenous, Once, Eduard Clos, MD .  0.9 %  sodium chloride infusion, , Intravenous, Continuous, Herbert Moors F, MD .  acetaminophen (TYLENOL) tablet 650 mg, 650 mg, Oral, Q6H PRN **OR** acetaminophen (TYLENOL) suppository 650 mg, 650 mg, Rectal, Q6H PRN, Eduard Clos, MD .  amitriptyline (ELAVIL) tablet 150 mg, 150 mg, Oral, QHS, Rizwan, Saima, MD .  cyclobenzaprine (FLEXERIL) tablet 10 mg, 10 mg, Oral, QHS, Rizwan, Saima, MD .  gabapentin (NEURONTIN) capsule 300  mg, 300 mg, Oral, TID, Rizwan, Saima, MD, 300 mg at 01/27/19 1121 .  labetalol (NORMODYNE) injection 5 mg, 5 mg, Intravenous, Q2H PRN, Rise Patience, MD .  ondansetron [facility]) tablet 4 mg, 4 mg, Oral, Q6H PRN **OR** ondansetron (ZOFRAN) injection 4 mg, 4 mg, Intravenous, Q6H PRN, Rise Patience, MD .  oxyCODONE-acetaminophen (PERCOCET) 7.5-325 MG per tablet 1 tablet, 1 tablet, Oral, Q4H PRN, Debbe Odea, MD, 1 tablet at 01/27/19 1121 .  pantoprazole (PROTONIX) 80 mg in sodium chloride 0.9 % 250 mL (0.32 mg/mL) infusion, 8 mg/hr, Intravenous, Continuous, Rise Patience, MD, Last Rate: 25 mL/hr at 01/27/19 0935, 8 mg/hr at 01/27/19 0935 .  [START ON 01/30/2019] pantoprazole (PROTONIX) injection 40 mg, 40 mg, Intravenous, Q12H, Rise Patience, MD Allergies  Allergen Reactions  . Butrans [Buprenorphine] Itching       . Other Itching    Pain patch (unsure of name)     Objective:     BP (!) 142/95 (BP Location: Right Arm)   Pulse 91   Temp 98 F (36.7 C) (Oral)   Resp 18   Ht 5\' 9"  (1.753 m)   Wt 79.9 kg   SpO2 98%   BMI 26.01 kg/m   No distress  Heart  regular rhythm no murmurs  Lungs clear  Abdomen soft and nontender  Laboratory No components found for: D1    Assessment:     Melena  Anemia      Plan:     Continue IV PPI for now. Proceed with EGD tomorrow Lab Results  Component Value Date   HGB 9.4 (L) 01/27/2019   HGB 9.1 (L) 01/27/2019   HGB 9.8 (L) 01/26/2019   HCT 31.1 (L) 01/27/2019   HCT 31.8 (L) 01/27/2019   HCT 33.8 (L) 01/26/2019   ALKPHOS 103 01/26/2019   ALKPHOS 131 (H) 04/02/2012   ALKPHOS 96 12/23/2011   AST 16 01/26/2019   AST 59 (H) 04/02/2012   AST 17 12/23/2011   ALT 15 01/26/2019   ALT 48 04/02/2012   ALT 12 12/23/2011

## 2019-01-27 NOTE — ED Notes (Signed)
ED TO INPATIENT HANDOFF REPORT  ED Nurse Name and Phone #:  Alan Ripperndrew RN [phone]  S Name/Age/Gender Dustin Carter 63 y.o. male Room/Bed: 013C/013C  Code Status   Code Status: Full Code  Home/SNF/Other Home Patient oriented to: self, place, time and situation Is this baseline? Yes   Triage Complete: Triage complete  Chief Complaint Hemoglobin 8.7  Triage Note Pt to ER for evaluation of anemia hgb 8.7, reports sent from PCP. Reports 1 month dark stools with epigastric discomfort, takes aspirin daily. He is in NAD today. Does report dizziness with standing.    Allergies Allergies  Allergen Reactions  . Butrans [Buprenorphine] Itching       . Other Itching    Pain patch (unsure of name)    Level of Care/Admitting Diagnosis ED Disposition    ED Disposition Condition Comment   Admit  [facility] [[id number]]  Level of Care: Telemetry Medical [104]  I expect the patient will be discharged within 24 hours: No (not a candidate for 5C-Observation unit)  Covid Evaluation: Asymptomatic Screening Protocol (No Symptoms)  Diagnosis: Acute GI bleeding [[id number]][[id number]]  Admitting Physician: Eduard ClosKAKRAKANDY, ARSHAD N [phone][[id number]]  Attending Physician: Eduard ClosKAKRAKANDY, ARSHAD N Florian.Pax[[id number]]  PT Class (Do Not Modify): Observation [104]  PT Acc Code (Do Not Modify): Observation [10022]       B Medical/Surgery History Past Medical History:  Diagnosis Date  . Arthritis    SPINE AND "HEAD TO TOES"  . Atypical chest pain at rest   cardiologist-- dr berry-- work up done per note all normal (stress test and cardiac cath)  . BPH (benign prostatic hypertrophy) Mar 29, 2011   TREATED IN DR. TANNENBAUM'S OFFICE WITH "HEAT" THERAPY TO SHRINK THE PROSTATE  . DOE (dyspnea on exertion)   . Family history of adverse reaction to anesthesia    MOTHER--- HARD TO WAKE  . Full dentures   . GERD (gastroesophageal reflux disease)   . History of kidney stones   . Hyperlipidemia   . Hypertension    . Polyneuropathy    DUE TO POST SURGERY'S  . Renal calculi    bilateral   . Sinus tachycardia cardiologist- dr berry   hx palpitations w/ holter event monitor showed SR, SB, runs of PSVT-- pt takes beta blocker  . Wears glasses    Past Surgical History:  Procedure Laterality Date  . ANTERIOR CERVICAL DECOMP/DISCECTOMY FUSION  03/09/2004;  04-29-2008   C5-6 (03-09-2004)  C3-4 (04-29-2008  . CARDIOVASCULAR STRESS TEST  08-30-2016  dr berry   Low risk nuclear study w/ normal perfusion and mild reduced LV global systolic function (study was gated , no percentage on results) recommend echo  . CERVICAL FUSION  11/01/2005   C6-7 diskecotmy, decompression and fusion/  removal hardware C5-6  . CHOLECYSTECTOMY  12/29/2011   Procedure: LAPAROSCOPIC CHOLECYSTECTOMY WITH INTRAOPERATIVE CHOLANGIOGRAM;  Surgeon: Atilano InaEric M Wilson, MD,FACS;  Location: [facility];  Service: General;  Laterality: N/A;  . CYSTOSCOPY W/ URETERAL STENT PLACEMENT Left 12-05-1999;  01-02-2000  . CYSTOSCOPY/RETROGRADE/URETEROSCOPY/STONE EXTRACTION WITH BASKET Left 04/15/2014   Procedure: CYSTOSCOPY,  LEFT RETROGRADE PYELOGRAM,  LEFT URETEROSCOPY,  LEFT STONE EXTRACTION WITH BASKET, LEFT STENT PLACEMENT ;  Surgeon: Kathi LudwigSigmund I Tannenbaum, MD;  Location: [facility];  Service: Urology;  Laterality: Left;  . CYSTOSCOPY/RETROGRADE/URETEROSCOPY/STONE EXTRACTION WITH BASKET Bilateral left 12-14-1999;  right 11-10-2007  dr Patsi Searstannenbaum  . CYSTOSCOPY/URETEROSCOPY/HOLMIUM LASER/STENT PLACEMENT Bilateral 12/29/2016   Procedure: CYSTOSCOPY RIGHT URETEROSCOPY POSSIBLE LEFT  WITH HOLMIUM LASER AND URETERAL STENT  PLACEMENT;  Surgeon: Crista Elliot, MD;  Location: [facility];  Service: Urology;  Laterality: Bilateral;  . HOLMIUM LASER APPLICATION Left 04/15/2014   Procedure: HOLMIUM LASER APPLICATION;  Surgeon: Kathi Ludwig, MD;  Location: [facility];  Service: Urology;  Laterality: Left;  . IRRIGATION AND DEBRIDEMENT ABSCESS  07/27/2009    left index finger  . LEFT HEART CATHETERIZATION WITH CORONARY ANGIOGRAM N/A 11/02/2011   Procedure: LEFT HEART CATHETERIZATION WITH CORONARY ANGIOGRAM;  Surgeon: Runell Gess, MD;  Location: [facility] CATH LAB;  Service: Cardiovascular;  Laterality: N/A;  normal coronary arteries and lvf  . LUMBAR DISC SURGERY  10-15-2008  dr hirsch   redo laminectomy L4-5 w/ fusion/  revision L3-5 fusion  . LUMBAR FUSION  06-29-2001  dr hirsch   L3-4 diskectomy/ L1-2 lam. & disk./  fusion L1-4/  open reduction and internal fixation spondylolisthesis L2-3  . LUMBAR LAMINECTOMY/DECOMPRESSION MICRODISCECTOMY  02/29/2012   Procedure: LUMBAR LAMINECTOMY/DECOMPRESSION MICRODISCECTOMY 1 LEVEL;  Surgeon: Clydene Fake, MD;  Location: [facility];  Service: Neurosurgery;  Laterality: Right;  Right Lumbar five-sacral oneDiskectomy  . LUMBAR SPINE SURGERY  1997  . POSTERIOR FUSION CERVICAL SPINE  06/06/2006   C6-7 and C7-T1  . SPINE SURGERY  05-24-2000  dr hirsch   L3-4 laminectomy/ diskectomy;  T9-10 diskectomy/ fusion  . TONSILLECTOMY AND ADENOIDECTOMY  age 35  . TOTAL SHOULDER ARTHROPLASTY Left 01-08-2010  dr supple [facility]  . TRANSTHORACIC ECHOCARDIOGRAM  09-10-2016   dr berry   ef 60-65%/  mild AV calcification without stenosis, no regurg./  mild MR and TR/ mild LAE     A IV Location/Drains/Wounds Patient Lines/Drains/Airways Status   Active Line/Drains/Airways    Name:   Placement date:   Placement time:   Site:   Days:   Peripheral IV 01/26/19 Left Wrist   01/26/19    1814    Wrist   1   Ureteral Drain/Stent Left ureter 6 Fr.   04/15/14    0839    Left ureter   1748   Ureteral Drain/Stent Right ureter 6 Fr.   12/29/16    1015    Right ureter   759   Ureteral Drain/Stent Left ureter 6 Fr.   12/29/16    1042    Left ureter   759   Incision 02/29/12 Back Bilateral   02/29/12    1500     2524   Incision (Closed) 04/15/14 Perineum   04/15/14    0901     1748   Incision (Closed) 12/29/16 Penis   12/29/16    1001      759   Incision - 5 Ports Abdomen 1: Umbilicus 2: Left;Superior 3: Right;Medial 4: Right;Lateral 5: Right;Superior   12/29/11    0850     2586          Intake/Output Last 24 hours  Intake/Output Summary (Last 24 hours) at 01/27/2019 0125 Last data filed at 01/26/2019 2041 Gross per 24 hour  Intake 305 ml  Output -  Net 305 ml    Labs/Imaging Results for orders placed or performed during the hospital encounter of 01/26/19 (from the past 48 hour(s))  Comprehensive metabolic panel     Status: Abnormal   Collection Time: 01/26/19 11:43 AM  Result Value Ref Range   Sodium 138 135 - 145 mmol/L   Potassium 4.0 3.5 - 5.1 mmol/L   Chloride 100 98 - 111 mmol/L   CO2 29 22 - 32  mmol/L   Glucose, Bld 98 70 - 99 mg/dL   BUN 10 8 - 23 mg/dL   Creatinine, Ser 1.611.15 0.61 - 1.24 mg/dL   Calcium 8.6 (L) 8.9 - 10.3 mg/dL   Total Protein 5.8 (L) 6.5 - 8.1 g/dL   Albumin 3.1 (L) 3.5 - 5.0 g/dL   AST 16 15 - 41 U/L   ALT 15 0 - 44 U/L   Alkaline Phosphatase 103 38 - 126 U/L   Total Bilirubin 0.4 0.3 - 1.2 mg/dL   GFR calc non Af Amer >60 >60 mL/min   GFR calc Af Amer >60 >60 mL/min   Anion gap 9 5 - 15    Comment: Performed at [facility], [address] [place], [place] [address]  CBC     Status: Abnormal   Collection Time: 01/26/19 11:43 AM  Result Value Ref Range   WBC 6.7 4.0 - 10.5 K/uL   RBC 3.90 (L) 4.22 - 5.81 MIL/uL   Hemoglobin 7.9 (L) 13.0 - 17.0 g/dL    Comment: Reticulocyte Hemoglobin testing may be clinically indicated, consider ordering this additional test [id number]    HCT 28.4 (L) 39.0 - 52.0 %   MCV 72.8 (L) 80.0 - 100.0 fL   MCH 20.3 (L) 26.0 - 34.0 pg   MCHC 27.8 (L) 30.0 - 36.0 g/dL   RDW 14.716.7 (H) 82.911.5 - 56.215.5 %   Platelets 205 150 - 400 K/uL   nRBC 0.0 0.0 - 0.2 %    Comment: Performed at [facility], [address] [place], [place] [address]  Type and screen [facility]     Status: None (Preliminary result)   Collection Time:  01/26/19  2:39 PM  Result Value Ref Range   ABO/RH(D) O POS    Antibody Screen NEG    Sample Expiration 01/29/2019,2359    Unit Number [id number]    Blood Component Type RED CELLS,LR    Unit division 00    Status of Unit ISSUED    Transfusion Status OK TO TRANSFUSE    Crossmatch Result      Compatible Performed at [facility], [address] [place], [place] [address]   Prepare RBC     Status: None   Collection Time: 01/26/19  4:48 PM  Result Value Ref Range   Order Confirmation      ORDER PROCESSED BY BLOOD BANK Performed at [facility], [address] [place], [place] [address]   SARS CORONAVIRUS 2 (TAT 6-24 HRS) Nasopharyngeal Nasopharyngeal Swab     Status: None   Collection Time: 01/26/19  4:49 PM   Specimen: Nasopharyngeal Swab  Result Value Ref Range   SARS Coronavirus 2 NEGATIVE NEGATIVE    Comment: (NOTE) SARS-CoV-2 target nucleic acids are NOT DETECTED. The SARS-CoV-2 RNA is generally detectable in upper and lower respiratory specimens during the acute phase of infection. Negative results do not preclude SARS-CoV-2 infection, do not rule out co-infections with other pathogens, and should not be used as the sole basis for treatment or other patient management decisions. Negative results must be combined with clinical observations, patient history, and epidemiological information. The expected result is Negative. Fact Sheet for Patients: HairSlick.nohttps://www.fda.gov/media/138098/download Fact Sheet for Healthcare Providers: quierodirigir.comhttps://www.fda.gov/media/138095/download This test is not yet approved or cleared by the [place] FDA and  has been authorized for detection and/or diagnosis of SARS-CoV-2 by FDA under an Emergency Use Authorization (EUA). This EUA will remain  in effect (meaning this test can be used) for the duration of the COVID-19 declaration under Section 56 4(b)(1) of the Act, 21 U.S.C. section 360bbb-3(b)(1), unless the authorization is  terminated or revoked sooner. Performed at [facility], [address], [place], [place] [address]   POC occult blood, ED     Status: None   Collection Time: 01/26/19  5:01 PM  Result Value Ref Range   Fecal Occult Bld NEGATIVE NEGATIVE  CBG monitoring, ED     Status: None   Collection Time: 01/26/19 10:25 PM  Result Value Ref Range   Glucose-Capillary 88 70 - 99 mg/dL  CBC     Status: Abnormal   Collection Time: 01/26/19 11:00 PM  Result Value Ref Range   WBC 7.6 4.0 - 10.5 K/uL   RBC 4.58 4.22 - 5.81 MIL/uL   Hemoglobin 9.8 (L) 13.0 - 17.0 g/dL   HCT 84.1 (L) 66.0 - 63.0 %   MCV 73.8 (L) 80.0 - 100.0 fL   MCH 21.4 (L) 26.0 - 34.0 pg   MCHC 29.0 (L) 30.0 - 36.0 g/dL   RDW 16.0 (H) 10.9 - 32.3 %   Platelets 215 150 - 400 K/uL   nRBC 0.0 0.0 - 0.2 %    Comment: Performed at [facility], [address], [place], [place] [address]   No results found.  Pending Labs Unresulted Labs (From admission, onward)    Start     Ordered   01/27/19 0500  Basic metabolic panel  Tomorrow morning,   R     01/26/19 2305   01/26/19 2305  HIV Antibody (routine testing w rflx)  (HIV Antibody (Routine testing w reflex) panel)  Once,   STAT     01/26/19 2305   01/26/19 2304  HIV4GL Save Tube  (HIV Antibody (Routine testing w reflex) panel)  Once,   STAT     01/26/19 2305   01/26/19 2300  CBC  Now then every 4 hours,   R     01/26/19 2259   01/26/19 1958  SARS Coronavirus 2 [facility] order, Performed in [facility] hospital lab) Nasopharyngeal Nasopharyngeal Swab  (Symptomatic/High Risk of Exposure/Tier 1 Patients Labs with Precautions)  ONCE - STAT,   STAT    Question Answer Comment  Is this test for diagnosis or screening Diagnosis of ill patient   Symptomatic for COVID-19 as defined by CDC Yes   Date of Symptom Onset 01/26/2019   Hospitalized for COVID-19 Yes   Admitted to ICU for COVID-19 No   Previously tested for COVID-19 Yes   Resident in a congregate (group) care setting  No   Employed in healthcare setting No      01/26/19 1957          Vitals/Pain Today's Vitals   01/27/19 0000 01/27/19 0045 01/27/19 0100 01/27/19 0115  BP: (!) 149/72 (!) 148/79 (!) 143/75 (!) 148/75  Pulse: 82 82 80 81  Resp: 10 16 16 19   Temp:      TempSrc:      SpO2: 98% 98% 98% 99%  PainSc:        Isolation Precautions No active isolations  Medications Medications  0.9 %  sodium chloride infusion (has no administration in time range)  pantoprazole (PROTONIX) 80 mg in sodium chloride 0.9 % 250 mL (0.32 mg/mL) infusion (8 mg/hr Intravenous New Bag/Given 01/26/19 2357)  pantoprazole (PROTONIX) injection 40 mg (has no administration in time range)  labetalol (NORMODYNE) injection  5 mg (has no administration in time range)  acetaminophen (TYLENOL) tablet 650 mg (has no administration in time range)    Or  acetaminophen (TYLENOL) suppository 650 mg (has no administration in time range)  ondansetron (ZOFRAN) tablet 4 mg (has no administration in time range)    Or  ondansetron (ZOFRAN) injection 4 mg (has no administration in time range)  pantoprazole (PROTONIX) injection 40 mg (40 mg Intravenous Given 01/26/19 1815)    Mobility walks Low fall risk   Focused Assessments   R Recommendations: See Admitting Provider Note  Report given to:   Additional Notes:

## 2019-01-28 ENCOUNTER — Encounter (HOSPITAL_COMMUNITY): Payer: Self-pay

## 2019-01-28 ENCOUNTER — Inpatient Hospital Stay (HOSPITAL_COMMUNITY): Payer: PPO | Admitting: Anesthesiology

## 2019-01-28 ENCOUNTER — Encounter (HOSPITAL_COMMUNITY)
Admission: EM | Disposition: A | Discharge status: Home / Self Care | Payer: Self-pay | Source: Home / Self Care | Attending: Internal Medicine

## 2019-01-28 DIAGNOSIS — M549 Dorsalgia, unspecified: Secondary | ICD-10-CM

## 2019-01-28 DIAGNOSIS — G8929 Other chronic pain: Secondary | ICD-10-CM

## 2019-01-28 DIAGNOSIS — K279 Peptic ulcer, site unspecified, unspecified as acute or chronic, without hemorrhage or perforation: Secondary | ICD-10-CM

## 2019-01-28 HISTORY — PX: BIOPSY: SHX5522

## 2019-01-28 HISTORY — PX: ESOPHAGOGASTRODUODENOSCOPY (EGD) WITH PROPOFOL: SHX5813

## 2019-01-28 LAB — CBC
HCT: 29.7 % — ABNORMAL LOW (ref 39.0–52.0)
Hemoglobin: 8.9 g/dL — ABNORMAL LOW (ref 13.0–17.0)
MCH: 21.5 pg — ABNORMAL LOW (ref 26.0–34.0)
MCHC: 30 g/dL (ref 30.0–36.0)
MCV: 71.7 fL — ABNORMAL LOW (ref 80.0–100.0)
Platelets: 205 10*3/uL (ref 150–400)
RBC: 4.14 MIL/uL — ABNORMAL LOW (ref 4.22–5.81)
RDW: 17.1 % — ABNORMAL HIGH (ref 11.5–15.5)
WBC: 6.8 10*3/uL (ref 4.0–10.5)
nRBC: 0 % (ref 0.0–0.2)

## 2019-01-28 LAB — GLUCOSE, CAPILLARY
Glucose-Capillary: 175 mg/dL — ABNORMAL HIGH (ref 70–99)
Glucose-Capillary: 66 mg/dL — ABNORMAL LOW (ref 70–99)
Glucose-Capillary: 84 mg/dL (ref 70–99)
Glucose-Capillary: 92 mg/dL (ref 70–99)

## 2019-01-28 LAB — BASIC METABOLIC PANEL
Anion gap: 10 (ref 5–15)
BUN: 5 mg/dL — ABNORMAL LOW (ref 8–23)
CO2: 26 mmol/L (ref 22–32)
Calcium: 8.5 mg/dL — ABNORMAL LOW (ref 8.9–10.3)
Chloride: 103 mmol/L (ref 98–111)
Creatinine, Ser: 0.93 mg/dL (ref 0.61–1.24)
GFR calc Af Amer: >60 mL/min (ref >60)
GFR calc non Af Amer: >60 mL/min (ref >60)
Glucose, Bld: 160 mg/dL — ABNORMAL HIGH (ref 70–99)
Potassium: 3.5 mmol/L (ref 3.5–5.1)
Sodium: 139 mmol/L (ref 135–145)

## 2019-01-28 LAB — MRSA PCR SCREENING: MRSA by PCR: NEGATIVE

## 2019-01-28 SURGERY — ESOPHAGOGASTRODUODENOSCOPY (EGD) WITH PROPOFOL
Anesthesia: Monitor Anesthesia Care

## 2019-01-28 MED ORDER — PANTOPRAZOLE SODIUM 40 MG PO TBEC: 40.0000 mg | DELAYED_RELEASE_TABLET | Freq: Two times a day (BID) | ORAL | 0 refills | Status: AC

## 2019-01-28 MED ORDER — DEXTROSE 50 % IV SOLN
INTRAVENOUS | Status: AC
  Administered 2019-01-28: 25 mL
  Filled 2019-01-28: qty 50

## 2019-01-28 MED ORDER — DEXTROSE-NACL 5-0.9 % IV SOLN
INTRAVENOUS | Status: DC
  Administered 2019-01-28: 02:00:00 via INTRAVENOUS

## 2019-01-28 MED ORDER — PROPOFOL 500 MG/50ML IV EMUL
INTRAVENOUS | Status: DC | PRN
  Administered 2019-01-28: 100 ug/kg/min via INTRAVENOUS

## 2019-01-28 MED ORDER — PROPOFOL 10 MG/ML IV BOLUS
INTRAVENOUS | Status: DC | PRN
  Administered 2019-01-28 (×6): 20 mg via INTRAVENOUS

## 2019-01-28 SURGICAL SUPPLY — 15 items

## 2019-01-28 NOTE — Discharge Summary (Signed)
Physician Discharge Summary  Dustin Carter QVZ:[id number] DOB: 1956-04-19 DOA: 01/26/2019  PCP: Deland Pretty, MD  Admit date: 01/26/2019 Discharge date: 01/28/2019  Admitted From: home Disposition:  home   Recommendations for Outpatient Follow-up:  1. F/u Hb in 2-3 wks- f/u with GI in 2-3 wks   Discharge Condition:  stable CODE STATUS:  Full code   Diet recommendation:  Bland diet, heart healthy Consultations:  GI    Discharge Diagnoses:  Principal Problem:   Melena Active Problems:   Acute blood loss anemia   PUD (peptic ulcer disease)   Essential hypertension   Chronic back pain     Brief Summary: Dustin Carter is a 63 y.o.malewithhistory of hypertension, hyperlipidemia, chronic pain was referred to the ER after patient's hemoglobin was found to be low in his routine labs. Patient states over the last 1 month he has been feeling weak and fatigue on exertion, upper abdominal pain and black stools.  The patient has chronic pain and takes bothMobic and aspirin.  He also drinks a shot of vodka every night. Patient states he has had EGD about 5 to 6 years ago for reflux by [facility].   Hospital Course:  Principal Problem:   Acute GI bleeding-melena - no further bleeding in the hospital - holding Mobic and Aspirin - EGD 10/4 >  Non-bleeding gastric ulcer with no stigmata of                            bleeding. Biopsied.                           - Non-bleeding duodenal ulcer with no stigmata of                            bleeding. - Gi recommendations> BID PPI, no NSAIDs and f/u on 2-3 wks with Dr Penelope Coop  Active Problems: Acute blood loss anemia-microcytic anemia - Hemoglobin 7.9 has improved to 9.4 after 1 unit of packed red blood cells transfused on amission    Essential hypertension and h/o palpitations - he wore a heart monitor which did not show any arrhythmias  - cont metoprolol  Chronic pain- back - resumed Oxycodone, Flexeril, Neurontin and  Elavil    Discharge Exam: Vitals:   01/28/19 0945 01/28/19 1005  BP: (!) 126/55 (!) 153/80  Pulse: 97 87  Resp: 12 14  Temp:    SpO2: 100% 97%   Vitals:   01/28/19 0935 01/28/19 0940 01/28/19 0945 01/28/19 1005  BP: 112/86 136/68 (!) 126/55 (!) 153/80  Pulse: 99 96 97 87  Resp: (!) 26 15 12 14   Temp: 98 F (36.7 C)     TempSrc: Oral     SpO2: 96% 99% 100% 97%  Weight:      Height:        General: Pt is alert, awake, not in acute distress Cardiovascular: RRR, S1/S2 +, no rubs, no gallops Respiratory: CTA bilaterally, no wheezing, no rhonchi Abdominal: Soft, NT, ND, bowel sounds + Extremities: no edema, no cyanosis   Discharge Instructions  Discharge Instructions    Diet general   Complete by: As directed    Heart healthy, bland diet   Increase activity slowly   Complete by: As directed      Allergies as of 01/28/2019      Reactions   Butrans [  buprenorphine] Itching      Other Itching   Pain patch (unsure of name)      Medication List    STOP taking these medications   aspirin 81 MG EC tablet   meloxicam 15 MG tablet Commonly known as: MOBIC     TAKE these medications   amitriptyline 150 MG tablet Commonly known as: ELAVIL Take 150 mg by mouth at bedtime.   atorvastatin 40 MG tablet Commonly known as: LIPITOR Take 40 mg by mouth at bedtime.   cyclobenzaprine 10 MG tablet Commonly known as: FLEXERIL Take 10 mg by mouth at bedtime. For muscle spasms   gabapentin 300 MG capsule Commonly known as: NEURONTIN Take 300 mg by mouth 3 (three) times daily.   losartan 50 MG tablet Commonly known as: COZAAR TAKE 1 TABLET BY MOUTH DAILY   metoprolol succinate 100 MG 24 hr tablet Commonly known as: TOPROL-XL TAKE 1 TABLET BY MOUTH EVERY MORNING WITH OR IMMEDIATELY FOLLOWING A MEAL What changed: See the new instructions.   oxyCODONE-acetaminophen 7.5-325 MG tablet Commonly known as: PERCOCET Take 1 tablet by mouth every 4 (four) hours as needed  for pain.   pantoprazole 40 MG tablet Commonly known as: PROTONIX Take 1 tablet (40 mg total) by mouth 2 (two) times daily before a meal. What changed: when to take this      Follow-up Information    Graylin ShiverGanem, Salem F, MD. Schedule an appointment as soon as possible for a visit in 3 week(s).   Specialty: Gastroenterology Contact information: [address] [place] [address] [phone]          Allergies  Allergen Reactions  . Butrans [Buprenorphine] Itching       . Other Itching    Pain patch (unsure of name)     Procedures/Studies: EGD  No results found.   The results of significant diagnostics from this hospitalization (including imaging, microbiology, ancillary and laboratory) are listed below for reference.     Microbiology: Recent Results (from the past 240 hour(s))  SARS CORONAVIRUS 2 (TAT 6-24 HRS) Nasopharyngeal Nasopharyngeal Swab     Status: None   Collection Time: 01/26/19  4:49 PM   Specimen: Nasopharyngeal Swab  Result Value Ref Range Status   SARS Coronavirus 2 NEGATIVE NEGATIVE Final    Comment: (NOTE) SARS-CoV-2 target nucleic acids are NOT DETECTED. The SARS-CoV-2 RNA is generally detectable in upper and lower respiratory specimens during the acute phase of infection. Negative results do not preclude SARS-CoV-2 infection, do not rule out co-infections with other pathogens, and should not be used as the sole basis for treatment or other patient management decisions. Negative results must be combined with clinical observations, patient history, and epidemiological information. The expected result is Negative. Fact Sheet for Patients: HairSlick.nohttps://www.fda.gov/media/138098/download Fact Sheet for Healthcare Providers: quierodirigir.comhttps://www.fda.gov/media/138095/download This test is not yet approved or cleared by the [place] FDA and  has been authorized for detection and/or diagnosis of SARS-CoV-2 by FDA under an Emergency Use  Authorization (EUA). This EUA will remain  in effect (meaning this test can be used) for the duration of the COVID-19 declaration under Section 56 4(b)(1) of the Act, 21 U.S.C. section 360bbb-3(b)(1), unless the authorization is terminated or revoked sooner. Performed at [facility], [address] [place], [place] [address]   MRSA PCR Screening     Status: None   Collection Time: 01/28/19  6:20 AM   Specimen: Nasal Mucosa; Nasopharyngeal  Result Value Ref Range Status  MRSA by PCR NEGATIVE NEGATIVE Final    Comment:        The GeneXpert MRSA Assay (FDA approved for NASAL specimens only), is one component of a comprehensive MRSA colonization surveillance program. It is not intended to diagnose MRSA infection nor to guide or monitor treatment for MRSA infections. Performed at [facility], [address], [place], [place] [address]      Labs: BNP (last 3 results) No results for input(s): BNP in the last 8760 hours. Basic Metabolic Panel: Recent Labs  Lab 01/26/19 1143 01/27/19 0234 01/28/19 0230  NA 138 139 139  K 4.0 4.4 3.5  CL 100 104 103  CO2 29 25 26   GLUCOSE 98 104* 160*  BUN 10 9 5*  CREATININE 1.15 0.91 0.93  CALCIUM 8.6* 8.5* 8.5*   Liver Function Tests: Recent Labs  Lab 01/26/19 1143  AST 16  ALT 15  ALKPHOS 103  BILITOT 0.4  PROT 5.8*  ALBUMIN 3.1*   No results for input(s): LIPASE, AMYLASE in the last 168 hours. No results for input(s): AMMONIA in the last 168 hours. CBC: Recent Labs  Lab 01/26/19 1143 01/26/19 2300 01/27/19 0234 01/27/19 0800 01/28/19 0230  WBC 6.7 7.6 7.8 7.3 6.8  HGB 7.9* 9.8* 9.1* 9.4* 8.9*  HCT 28.4* 33.8* 31.8* 31.1* 29.7*  MCV 72.8* 73.8* 73.1* 71.7* 71.7*  PLT 205 215 226 225 205   Cardiac Enzymes: No results for input(s): CKTOTAL, CKMB, CKMBINDEX, TROPONINI in the last 168 hours. BNP: Invalid input(s): POCBNP CBG: Recent Labs  Lab 01/27/19 1619 01/28/19 0139 01/28/19 0157  01/28/19 0738 01/28/19 1055  GLUCAP 83 66* 175* 84 92   D-Dimer No results for input(s): DDIMER in the last 72 hours. Hgb A1c No results for input(s): HGBA1C in the last 72 hours. Lipid Profile No results for input(s): CHOL, HDL, LDLCALC, TRIG, CHOLHDL, LDLDIRECT in the last 72 hours. Thyroid function studies No results for input(s): TSH, T4TOTAL, T3FREE, THYROIDAB in the last 72 hours.  Invalid input(s): FREET3 Anemia work up No results for input(s): VITAMINB12, FOLATE, FERRITIN, TIBC, IRON, RETICCTPCT in the last 72 hours. Urinalysis    Component Value Date/Time   COLORURINE YELLOW 04/01/2012 1833   APPEARANCEUR CLEAR 04/01/2012 1833   LABSPEC 1.007 04/01/2012 1833   PHURINE 6.0 04/01/2012 1833   GLUCOSEU NEGATIVE 04/01/2012 1833   HGBUR NEGATIVE 04/01/2012 1833   BILIRUBINUR NEGATIVE 04/01/2012 1833   KETONESUR NEGATIVE 04/01/2012 1833   PROTEINUR NEGATIVE 04/01/2012 1833   UROBILINOGEN 0.2 04/01/2012 1833   NITRITE NEGATIVE 04/01/2012 1833   LEUKOCYTESUR NEGATIVE 04/01/2012 1833   Sepsis Labs Invalid input(s): PROCALCITONIN,  WBC,  LACTICIDVEN Microbiology Recent Results (from the past 240 hour(s))  SARS CORONAVIRUS 2 (TAT 6-24 HRS) Nasopharyngeal Nasopharyngeal Swab     Status: None   Collection Time: 01/26/19  4:49 PM   Specimen: Nasopharyngeal Swab  Result Value Ref Range Status   SARS Coronavirus 2 NEGATIVE NEGATIVE Final    Comment: (NOTE) SARS-CoV-2 target nucleic acids are NOT DETECTED. The SARS-CoV-2 RNA is generally detectable in upper and lower respiratory specimens during the acute phase of infection. Negative results do not preclude SARS-CoV-2 infection, do not rule out co-infections with other pathogens, and should not be used as the sole basis for treatment or other patient management decisions. Negative results must be combined with clinical observations, patient history, and epidemiological information. The expected result is Negative. Fact  Sheet for Patients: 03/28/19 Fact Sheet for Healthcare Providers: HairSlick.no This test  is not yet approved or cleared by the [place] FDA and  has been authorized for detection and/or diagnosis of SARS-CoV-2 by FDA under an Emergency Use Authorization (EUA). This EUA will remain  in effect (meaning this test can be used) for the duration of the COVID-19 declaration under Section 56 4(b)(1) of the Act, 21 U.S.C. section 360bbb-3(b)(1), unless the authorization is terminated or revoked sooner. Performed at [facility], [address] [place], [place] [address]   MRSA PCR Screening     Status: None   Collection Time: 01/28/19  6:20 AM   Specimen: Nasal Mucosa; Nasopharyngeal  Result Value Ref Range Status   MRSA by PCR NEGATIVE NEGATIVE Final    Comment:        The GeneXpert MRSA Assay (FDA approved for NASAL specimens only), is one component of a comprehensive MRSA colonization surveillance program. It is not intended to diagnose MRSA infection nor to guide or monitor treatment for MRSA infections. Performed at [facility], [address] [place], [place] [address]      Time coordinating discharge in minutes: 65  SIGNED:   Calvert CantorSaima Suprena Travaglini, MD  [facility] 01/28/2019, 11:26 AM Pager   If 7PM-7AM, please contact night-coverage [email or web address] Password [email or web address]

## 2019-01-28 NOTE — Progress Notes (Signed)
Pt discharge education went over at bedside  Pt has all belongings  Pt IV removed, catheter intact and telemetry removed  Pt discharged via wheelchair with RN

## 2019-01-28 NOTE — Anesthesia Postprocedure Evaluation (Signed)
Anesthesia Post Note  Patient: Dustin Carter  Procedure(s) Performed: ESOPHAGOGASTRODUODENOSCOPY (EGD) WITH PROPOFOL (N/A ) BIOPSY     Patient location during evaluation: PACU Anesthesia Type: MAC Level of consciousness: awake and alert Pain management: pain level controlled Vital Signs Assessment: post-procedure vital signs reviewed and stable Respiratory status: spontaneous breathing, nonlabored ventilation and respiratory function stable Cardiovascular status: stable and blood pressure returned to baseline Anesthetic complications: no    Last Vitals:  Vitals:   01/28/19 0945 01/28/19 1005  BP: (!) 126/55 (!) 153/80  Pulse: 97 87  Resp: 12 14  Temp:    SpO2: 100% 97%    Last Pain:  Vitals:   01/28/19 1007  TempSrc:   PainSc: [place]

## 2019-01-28 NOTE — Op Note (Signed)
[facility] Patient Name: Dustin Carter Procedure Date : 01/28/2019 MRN: [id number] Attending MD: Wonda Horner , MD Date of Birth: 10/20/1955 CSN: [id number] Age: 63 Admit Type: Inpatient Procedure:                Upper GI endoscopy Indications:              Iron deficiency anemia secondary to chronic blood                            loss, Melena Providers:                Wonda Horner, MD, Raynelle Bring, RN, William Dalton, Technician Referring MD:              Medicines:                Propofol per Anesthesia Complications:            No immediate complications. Estimated Blood Loss:     Estimated blood loss was minimal. Procedure:                Pre-Anesthesia Assessment:                           - Prior to the procedure, a History and Physical                            was performed, and patient medications and                            allergies were reviewed. The patient's tolerance of                            previous anesthesia was also reviewed. The risks                            and benefits of the procedure and the sedation                            options and risks were discussed with the patient.                            All questions were answered, and informed consent                            was obtained. Prior Anticoagulants: The patient has                            taken no previous anticoagulant or antiplatelet                            agents. ASA Grade Assessment: II - A patient with  mild systemic disease. After reviewing the risks                            and benefits, the patient was deemed in                            satisfactory condition to undergo the procedure.                           After obtaining informed consent, the endoscope was                            passed under direct vision. Throughout the                            procedure, the patient's blood  pressure, pulse, and                            oxygen saturations were monitored continuously. The                            GIF-H190 ([id number]) Olympus gastroscope was                            introduced through the mouth, and advanced to the                            second part of duodenum. The upper GI endoscopy was                            accomplished without difficulty. The patient                            tolerated the procedure well. Scope In: Scope Out: Findings:      The examined esophagus was normal.      One non-bleeding cratered gastric ulcer with no stigmata of bleeding was       found in the gastric antrum. The lesion was 7 mm in largest dimension.       Biopsies were taken with a cold forceps for histology.      One non-bleeding cratered duodenal ulcer with no stigmata of bleeding       was found in the duodenal bulb. About 2cm in size. Impression:               - Normal esophagus.                           - Non-bleeding gastric ulcer with no stigmata of                            bleeding. Biopsied.                           - Non-bleeding duodenal ulcer with no stigmata of  bleeding. Recommendation:           - Resume regular diet.                           - Continue present medications.                           - Bid PPI therapy. Regular diet. He appears stable                            and could go home. Follow up with me in 2-3 weeks. Procedure Code(s):        --- Professional ---                           [phone], Esophagogastroduodenoscopy, flexible,                            transoral; with biopsy, single or multiple Diagnosis Code(s):        --- Professional ---                           K25.9, Gastric ulcer, unspecified as acute or                            chronic, without hemorrhage or perforation                           K26.9, Duodenal ulcer, unspecified as acute or                            chronic, without  hemorrhage or perforation                           D50.0, Iron deficiency anemia secondary to blood                            loss (chronic)                           K92.1, Melena (includes Hematochezia) CPT copyright 2019 American Medical Association. All rights reserved. The codes documented in this report are preliminary and upon coder review may  be revised to meet current compliance requirements. Graylin ShiverSalem F Lashae Wollenberg, MD 01/28/2019 9:39:44 AM This report has been signed electronically. Number of Addenda: 0

## 2019-01-28 NOTE — Discharge Instructions (Signed)
Bland Diet A bland diet consists of foods that are often soft and do not have a lot of fat, fiber, or extra seasonings. Foods without fat, fiber, or seasoning are easier for the body to digest. They are also less likely to irritate your mouth, throat, stomach, and other parts of your digestive system. A bland diet is sometimes called a BRAT diet. What is my plan? Your health care provider or food and nutrition specialist (dietitian) may recommend specific changes to your diet to prevent symptoms or to treat your symptoms. These changes may include:  Eating small meals often.  Cooking food until it is soft enough to chew easily.  Chewing your food well.  Drinking fluids slowly.  Not eating foods that are very spicy, sour, or fatty.  Not eating citrus fruits, such as oranges and grapefruit. What do I need to know about this diet?  Eat a variety of foods from the bland diet food list.  Do not follow a bland diet longer than needed.  Ask your health care provider whether you should take vitamins or supplements. What foods can I eat? Grains  Hot cereals, such as cream of wheat. Rice. Bread, crackers, or tortillas made from refined white flour. Vegetables Canned or cooked vegetables. Mashed or boiled potatoes. Fruits  Bananas. Applesauce. Other types of cooked or canned fruit with the skin and seeds removed, such as canned peaches or pears. Meats and other proteins  Scrambled eggs. Creamy peanut butter or other nut butters. Lean, well-cooked meats, such as chicken or fish. Tofu. Soups or broths. Dairy Low-fat dairy products, such as milk, cottage cheese, or yogurt. Beverages  Water. Herbal tea. Apple juice. Fats and oils Mild salad dressings. Canola or olive oil. Sweets and desserts Pudding. Custard. Fruit gelatin. Ice cream. The items listed above may not be a complete list of recommended foods and beverages. Contact a dietitian for more options. What foods are not  recommended? Grains Whole grain breads and cereals. Vegetables Raw vegetables. Fruits Raw fruits, especially citrus, berries, or dried fruits. Dairy Whole fat dairy foods. Beverages Caffeinated drinks. Alcohol. Seasonings and condiments Strongly flavored seasonings or condiments. Hot sauce. Salsa. Other foods Spicy foods. Fried foods. Sour foods, such as pickled or fermented foods. Foods with high sugar content. Foods high in fiber. The items listed above may not be a complete list of foods and beverages to avoid. Contact a dietitian for more information. Summary  A bland diet consists of foods that are often soft and do not have a lot of fat, fiber, or extra seasonings.  Foods without fat, fiber, or seasoning are easier for the body to digest.  Check with your health care provider to see how long you should follow this diet plan. It is not meant to be followed for long periods. This information is not intended to replace advice given to you by your health care provider. Make sure you discuss any questions you have with your health care provider. Document Released: 08/04/2015 Document Revised: 05/11/2017 Document Reviewed: 05/11/2017 Elsevier Patient Education  2020 Elsevier Inc.  

## 2019-01-28 NOTE — Progress Notes (Signed)
Pt returned to room from EGD  Pt tolerated ice water  Assisted pt in ordering breakfast  Will continue to monitor

## 2019-01-28 NOTE — H&P (Signed)
The patient presents to the endoscopy department for EGD to evaluate for melena and anemia.  Physical no distress  Heart regular rhythm no murmurs  Lungs clear  Abdomen soft and nontender  Impression melena and anemia  Plan EGD

## 2019-01-28 NOTE — Anesthesia Preprocedure Evaluation (Addendum)
Anesthesia Evaluation  Patient identified by MRN, date of birth, ID band Patient awake    Reviewed: Allergy & Precautions, NPO status , Patient's Chart, lab work & pertinent test results  History of Anesthesia Complications Negative for: history of anesthetic complications  Airway Mallampati: II  TM Distance: >3 FB Neck ROM: Full    Dental  (+) Edentulous Upper, Edentulous Lower   Pulmonary former smoker,    Pulmonary exam normal        Cardiovascular hypertension, Pt. on medications and Pt. on home beta blockers (-) anginaNormal cardiovascular exam+ dysrhythmias Supra Ventricular Tachycardia    '18 TTE - EF 60% to 65%. Mild MR. Mildly dilated LA    Neuro/Psych  S/p ACDF   Neuromuscular disease (polyneuropathy) negative psych ROS   GI/Hepatic Neg liver ROS, GERD  Medicated and Controlled,  Endo/Other  negative endocrine ROS  Renal/GU  Renal stones      Musculoskeletal  (+) Arthritis ,   Abdominal   Peds  Hematology  (+) anemia ,   Anesthesia Other Findings   Reproductive/Obstetrics                            Anesthesia Physical Anesthesia Plan  ASA: III  Anesthesia Plan: MAC   Post-op Pain Management:    Induction: Intravenous  PONV Risk Score and Plan: 1 and Propofol infusion and Treatment may vary due to age or medical condition  Airway Management Planned: Nasal Cannula and Natural Airway  Additional Equipment: None  Intra-op Plan:   Post-operative Plan:   Informed Consent: I have reviewed the patients History and Physical, chart, labs and discussed the procedure including the risks, benefits and alternatives for the proposed anesthesia with the patient or authorized representative who has indicated his/her understanding and acceptance.       Plan Discussed with: CRNA and Anesthesiologist  Anesthesia Plan Comments:        Anesthesia Quick Evaluation

## 2019-01-28 NOTE — Transfer of Care (Signed)
Immediate Anesthesia Transfer of Care Note  Patient: Dustin Carter  Procedure(s) Performed: ESOPHAGOGASTRODUODENOSCOPY (EGD) WITH PROPOFOL (N/A ) BIOPSY  Patient Location: Endoscopy Unit  Anesthesia Type:MAC  Level of Consciousness: awake, alert  and oriented  Airway & Oxygen Therapy: Patient Spontanous Breathing  Post-op Assessment: Report given to RN and Post -op Vital signs reviewed and stable  Post vital signs: Reviewed and stable  Last Vitals:  Vitals Value Taken Time  BP    Temp    Pulse    Resp    SpO2      Last Pain:  Vitals:   01/28/19 0845  TempSrc: Oral  PainSc: 0-No pain      Patients Stated Pain Goal: 0 (83/43/73 5789)  Complications: No apparent anesthesia complications

## 2019-01-28 NOTE — Progress Notes (Signed)
Hypoglycemic Event  CBG: 66  Treatment: D50 25 mL (12.5 gm)  Symptoms: None  Follow-up CBG: [id number] CBG [id number]  Possible Reasons for Event: Inadequate meal intake  Comments/MD notified: Followed hypoglycemic protocol and see epic for provider's order.    Dustin Carter

## 2019-01-29 ENCOUNTER — Encounter (HOSPITAL_COMMUNITY): Payer: Self-pay | Admitting: Gastroenterology

## 2019-01-30 DIAGNOSIS — M159 Polyosteoarthritis, unspecified: Secondary | ICD-10-CM | POA: Diagnosis not present

## 2019-01-30 DIAGNOSIS — I10 Essential (primary) hypertension: Secondary | ICD-10-CM | POA: Diagnosis not present

## 2019-01-30 DIAGNOSIS — R239 Unspecified skin changes: Secondary | ICD-10-CM | POA: Diagnosis not present

## 2019-01-30 DIAGNOSIS — G629 Polyneuropathy, unspecified: Secondary | ICD-10-CM | POA: Diagnosis not present

## 2019-01-30 DIAGNOSIS — D649 Anemia, unspecified: Secondary | ICD-10-CM | POA: Diagnosis not present

## 2019-01-30 DIAGNOSIS — R748 Abnormal levels of other serum enzymes: Secondary | ICD-10-CM | POA: Diagnosis not present

## 2019-01-30 DIAGNOSIS — Z0000 Encounter for general adult medical examination without abnormal findings: Secondary | ICD-10-CM | POA: Diagnosis not present

## 2019-01-30 DIAGNOSIS — R749 Abnormal serum enzyme level, unspecified: Secondary | ICD-10-CM | POA: Diagnosis not present

## 2019-01-30 DIAGNOSIS — Z9289 Personal history of other medical treatment: Secondary | ICD-10-CM | POA: Diagnosis not present

## 2019-01-30 DIAGNOSIS — K219 Gastro-esophageal reflux disease without esophagitis: Secondary | ICD-10-CM | POA: Diagnosis not present

## 2019-01-30 DIAGNOSIS — N400 Enlarged prostate without lower urinary tract symptoms: Secondary | ICD-10-CM | POA: Diagnosis not present

## 2019-01-30 LAB — SURGICAL PATHOLOGY

## 2019-02-13 DIAGNOSIS — M461 Sacroiliitis, not elsewhere classified: Secondary | ICD-10-CM | POA: Diagnosis not present

## 2019-02-19 DIAGNOSIS — K279 Peptic ulcer, site unspecified, unspecified as acute or chronic, without hemorrhage or perforation: Secondary | ICD-10-CM | POA: Diagnosis not present

## 2019-02-27 DIAGNOSIS — D649 Anemia, unspecified: Secondary | ICD-10-CM | POA: Diagnosis not present

## 2019-02-27 DIAGNOSIS — R749 Abnormal serum enzyme level, unspecified: Secondary | ICD-10-CM | POA: Diagnosis not present

## 2019-03-12 DIAGNOSIS — M961 Postlaminectomy syndrome, not elsewhere classified: Secondary | ICD-10-CM | POA: Diagnosis not present

## 2019-03-12 DIAGNOSIS — Q761 Klippel-Feil syndrome: Secondary | ICD-10-CM | POA: Diagnosis not present

## 2019-03-12 DIAGNOSIS — M461 Sacroiliitis, not elsewhere classified: Secondary | ICD-10-CM | POA: Diagnosis not present

## 2019-04-05 DIAGNOSIS — R748 Abnormal levels of other serum enzymes: Secondary | ICD-10-CM | POA: Diagnosis not present

## 2019-04-05 DIAGNOSIS — D509 Iron deficiency anemia, unspecified: Secondary | ICD-10-CM | POA: Diagnosis not present

## 2019-04-30 DIAGNOSIS — M542 Cervicalgia: Secondary | ICD-10-CM | POA: Diagnosis not present

## 2019-05-29 DIAGNOSIS — M542 Cervicalgia: Secondary | ICD-10-CM | POA: Diagnosis not present

## 2019-05-29 DIAGNOSIS — M5412 Radiculopathy, cervical region: Secondary | ICD-10-CM | POA: Diagnosis not present

## 2019-05-29 DIAGNOSIS — Q761 Klippel-Feil syndrome: Secondary | ICD-10-CM | POA: Diagnosis not present

## 2019-06-06 ENCOUNTER — Telehealth: Payer: Self-pay

## 2019-06-06 ENCOUNTER — Telehealth (INDEPENDENT_AMBULATORY_CARE_PROVIDER_SITE_OTHER): Payer: Medicare Other | Admitting: Cardiology

## 2019-06-06 ENCOUNTER — Telehealth: Payer: Self-pay | Admitting: Radiology

## 2019-06-06 ENCOUNTER — Encounter: Payer: Self-pay | Admitting: Cardiology

## 2019-06-06 VITALS — BP 128/72 | HR 76 | Ht 70.0 in | Wt 184.0 lb

## 2019-06-06 DIAGNOSIS — E785 Hyperlipidemia, unspecified: Secondary | ICD-10-CM

## 2019-06-06 DIAGNOSIS — R002 Palpitations: Secondary | ICD-10-CM | POA: Diagnosis not present

## 2019-06-06 DIAGNOSIS — I10 Essential (primary) hypertension: Secondary | ICD-10-CM

## 2019-06-06 DIAGNOSIS — K279 Peptic ulcer, site unspecified, unspecified as acute or chronic, without hemorrhage or perforation: Secondary | ICD-10-CM

## 2019-06-06 NOTE — Telephone Encounter (Signed)
Virtual Visit Pre-Appointment Phone Call  "(Name), I am calling you today to discuss your upcoming appointment. We are currently trying to limit exposure to the virus that causes COVID-19 by seeing patients at home rather than in the office."  1. "What is the BEST phone number to call the day of the visit?" - include this in appointment notes  2. "Do you have or have access to (through a family member/friend) a smartphone with video capability that we can use for your visit?" a. If yes - list this number in appt notes as "cell" (if different from BEST phone #) and list the appointment type as a VIDEO visit in appointment notes b. If no - list the appointment type as a PHONE visit in appointment notes  3. Confirm consent - "In the setting of the current Covid19 crisis, you are scheduled for a (phone or video) visit with your provider on (date) at (time).  Just as we do with many in-office visits, in order for you to participate in this visit, we must obtain consent.  If you'd like, I can send this to your mychart (if signed up) or email for you to review.  Otherwise, I can obtain your verbal consent now.  All virtual visits are billed to your insurance company just like a normal visit would be.  By agreeing to a virtual visit, we'd like you to understand that the technology does not allow for your provider to perform an examination, and thus may limit your provider's ability to fully assess your condition. If your provider identifies any concerns that need to be evaluated in person, we will make arrangements to do so.  Finally, though the technology is pretty good, we cannot assure that it will always work on either your or our end, and in the setting of a video visit, we may have to convert it to a phone-only visit.  In either situation, we cannot ensure that we have a secure connection.  Are you willing to proceed?" STAFF: Did the patient verbally acknowledge consent to telehealth visit? Document  YES/NO here: yes  4. Advise patient to be prepared - "Two hours prior to your appointment, go ahead and check your blood pressure, pulse, oxygen saturation, and your weight (if you have the equipment to check those) and write them all down. When your visit starts, your provider will ask you for this information. If you have an Apple Watch or Kardia device, please plan to have heart rate information ready on the day of your appointment. Please have a pen and paper handy nearby the day of the visit as well."  5. Give patient instructions for MyChart download to smartphone OR Doximity/Doxy.me as below if video visit (depending on what platform provider is using)  6. Inform patient they will receive a phone call 15 minutes prior to their appointment time (may be from unknown caller ID) so they should be prepared to answer    TELEPHONE CALL NOTE  Dustin Carter has been deemed a candidate for a follow-up tele-health visit to limit community exposure during the Covid-19 pandemic. I spoke with the patient via phone to ensure availability of phone/video source, confirm preferred email & phone number, and discuss instructions and expectations.  I reminded Dustin Carter to be prepared with any vital sign and/or heart rhythm information that could potentially be obtained via home monitoring, at the time of his visit. I reminded Dustin Carter to expect a phone call prior to  his visit.  Lucita Ferrara, CMA 06/06/2019 10:32 AM   INSTRUCTIONS FOR DOWNLOADING THE MYCHART APP TO SMARTPHONE  - The patient must first make sure to have activated MyChart and know their login information - If Apple, go to [employer] and type in MyChart in the search bar and download the app. If Android, ask patient to go to [employer] and type in [place] in the search bar and download the app. The app is free but as with any other app downloads, their phone may require them to verify saved payment information or Apple/Android  password.  - The patient will need to then log into the app with their MyChart username and password, and select [facility] as their healthcare provider to link the account. When it is time for your visit, go to the MyChart app, find appointments, and click Begin Video Visit. Be sure to Select Allow for your device to access the Microphone and Camera for your visit. You will then be connected, and your provider will be with you shortly.  **If they have any issues connecting, or need assistance please contact MyChart service desk [phone])**  **If using a computer, in order to ensure the best quality for their visit they will need to use either of the following Internet Browsers: [employer] or Google Chrome**  IF USING DOXIMITY or DOXY.ME - The patient will receive a link just prior to their visit by text.     FULL LENGTH CONSENT FOR TELE-HEALTH VISIT   I hereby voluntarily request, consent and authorize [facility] and its employed or contracted physicians, physician assistants, nurse practitioners or other licensed health care professionals (the Practitioner), to provide me with telemedicine health care services (the "Services") as deemed necessary by the treating Practitioner. I acknowledge and consent to receive the Services by the Practitioner via telemedicine. I understand that the telemedicine visit will involve communicating with the Practitioner through live audiovisual communication technology and the disclosure of certain medical information by electronic transmission. I acknowledge that I have been given the opportunity to request an in-person assessment or other available alternative prior to the telemedicine visit and am voluntarily participating in the telemedicine visit.  I understand that I have the right to withhold or withdraw my consent to the use of telemedicine in the course of my care at any time, without affecting my right to future care or treatment,  and that the Practitioner or I may terminate the telemedicine visit at any time. I understand that I have the right to inspect all information obtained and/or recorded in the course of the telemedicine visit and may receive copies of available information for a reasonable fee.  I understand that some of the potential risks of receiving the Services via telemedicine include:  Marland Kitchen Delay or interruption in medical evaluation due to technological equipment failure or disruption; . Information transmitted may not be sufficient (e.g. poor resolution of images) to allow for appropriate medical decision making by the Practitioner; and/or  . In rare instances, security protocols could fail, causing a breach of personal health information.  Furthermore, I acknowledge that it is my responsibility to provide information about my medical history, conditions and care that is complete and accurate to the best of my ability. I acknowledge that Practitioner's advice, recommendations, and/or decision may be based on factors not within their control, such as incomplete or inaccurate data provided by me or distortions of diagnostic images or specimens that may result from electronic transmissions.  I understand that the practice of medicine is not an exact science and that Practitioner makes no warranties or guarantees regarding treatment outcomes. I acknowledge that I will receive a copy of this consent concurrently upon execution via email to the email address I last provided but may also request a printed copy by calling the office of [facility].    I understand that my insurance will be billed for this visit.   I have read or had this consent read to me. . I understand the contents of this consent, which adequately explains the benefits and risks of the Services being provided via telemedicine.  . I have been provided ample opportunity to ask questions regarding this consent and the Services and have had my questions  answered to my satisfaction. . I give my informed consent for the services to be provided through the use of telemedicine in my medical care  By participating in this telemedicine visit I agree to the above.

## 2019-06-06 NOTE — Progress Notes (Signed)
Virtual Visit via Telephone Note   This visit type was conducted due to national recommendations for restrictions regarding the COVID-19 Pandemic (e.g. social distancing) in an effort to limit this patient's exposure and mitigate transmission in our community.  Due to his co-morbid illnesses, this patient is at least at moderate risk for complications without adequate follow up.  This format is felt to be most appropriate for this patient at this time.  The patient did not have access to video technology/had technical difficulties with video requiring transitioning to audio format only (telephone).  All issues noted in this document were discussed and addressed.  No physical exam could be performed with this format.  Please refer to the patient's chart for his  consent to telehealth for [facility].   Date:  06/06/2019   ID:  Dustin Carter, DOB 05-10-1955, MRN [id number]  Patient Location: Home Provider Location: Home  PCP:  Merri Brunette, MD  Cardiologist:  Nanetta Batty, MD  Electrophysiologist:  None   Evaluation Performed:  Follow-Up Visit  Chief Complaint:  palpitations  History of Present Illness:    Dustin Carter is a 64 y.o. male with a history of palpitations.  He had a stress test in 2018 that was low risk.  Echocardiogram in 2018 showed preserved LV function.  He has been treated with beta-blocker-Toprol 100 mg daily.  He has controlled hypertension and is on a statin for lipid therapy.  In October 2020 he was admitted with symptomatic anemia and found to have peptic ulcer disease.  He had been on an aspirin at that time and this was stopped.  He is doing well now, he is on iron therapy.  He was contacted today for routine follow-up.  The patient complains of tachycardia.  He says he has this almost daily.  He says it may last for up to 20 minutes.  He gets short of breath when he has this but otherwise has no complaints of exertional shortness of breath or chest pain.  He did  tell me that both his brothers and his father had irregular heart rhythm issues.  Both his brothers had had radiofrequency ablation and his father had a pacemaker.  Both brothers also have a history of coronary disease.  The patient does not have symptoms concerning for COVID-19 infection (fever, chills, cough, or new shortness of breath).    Past Medical History:  Diagnosis Date  . Arthritis    SPINE AND "HEAD TO TOES"  . Atypical chest pain at rest   cardiologist-- dr berry-- work up done per note all normal (stress test and cardiac cath)  . BPH (benign prostatic hypertrophy) Mar 29, 2011   TREATED IN DR. TANNENBAUM'S OFFICE WITH "HEAT" THERAPY TO SHRINK THE PROSTATE  . DOE (dyspnea on exertion)   . Family history of adverse reaction to anesthesia    MOTHER--- HARD TO WAKE  . Full dentures   . GERD (gastroesophageal reflux disease)   . History of kidney stones   . Hyperlipidemia   . Hypertension   . Polyneuropathy    DUE TO POST SURGERY'S  . Renal calculi    bilateral   . Sinus tachycardia cardiologist- dr berry   hx palpitations w/ holter event monitor showed SR, SB, runs of PSVT-- pt takes beta blocker  . Wears glasses    Past Surgical History:  Procedure Laterality Date  . ANTERIOR CERVICAL DECOMP/DISCECTOMY FUSION  03/09/2004;  04-29-2008   C5-6 (03-09-2004)  C3-4 (04-29-2008  .  BIOPSY  01/28/2019   Procedure: BIOPSY;  Surgeon: Wonda Horner, MD;  [facility] ENDOSCOPY;  Service: Endoscopy;;  . CARDIOVASCULAR STRESS TEST  08-30-2016  dr berry   Low risk nuclear study w/ normal perfusion and mild reduced LV global systolic function (study was gated , no percentage on results) recommend echo  . CERVICAL FUSION  11/01/2005   C6-7 diskecotmy, decompression and fusion/  removal hardware C5-6  . CHOLECYSTECTOMY  12/29/2011   Procedure: LAPAROSCOPIC CHOLECYSTECTOMY WITH INTRAOPERATIVE CHOLANGIOGRAM;  Surgeon: Gayland Curry, MD,FACS;  Location: [facility] ORS;  Service: General;   Laterality: N/A;  . CYSTOSCOPY W/ URETERAL STENT PLACEMENT Left 12-05-1999;  01-02-2000  . CYSTOSCOPY/RETROGRADE/URETEROSCOPY/STONE EXTRACTION WITH BASKET Left 04/15/2014   Procedure: CYSTOSCOPY,  LEFT RETROGRADE PYELOGRAM,  LEFT URETEROSCOPY,  LEFT STONE EXTRACTION WITH BASKET, LEFT STENT PLACEMENT ;  Surgeon: Ailene Rud, MD;  Location: [facility] ORS;  Service: Urology;  Laterality: Left;  . CYSTOSCOPY/RETROGRADE/URETEROSCOPY/STONE EXTRACTION WITH BASKET Bilateral left 12-14-1999;  right 11-10-2007  dr Gaynelle Arabian  . CYSTOSCOPY/URETEROSCOPY/HOLMIUM LASER/STENT PLACEMENT Bilateral 12/29/2016   Procedure: CYSTOSCOPY RIGHT URETEROSCOPY POSSIBLE LEFT  WITH HOLMIUM LASER AND URETERAL STENT PLACEMENT;  Surgeon: Lucas Mallow, MD;  Location: [facility];  Service: Urology;  Laterality: Bilateral;  . ESOPHAGOGASTRODUODENOSCOPY (EGD) WITH PROPOFOL N/A 01/28/2019   Procedure: ESOPHAGOGASTRODUODENOSCOPY (EGD) WITH PROPOFOL;  Surgeon: Wonda Horner, MD;  Location: [facility] ENDOSCOPY;  Service: Endoscopy;  Laterality: N/A;  . HOLMIUM LASER APPLICATION Left 24/58/0998   Procedure: HOLMIUM LASER APPLICATION;  Surgeon: Ailene Rud, MD;  Location: [facility] ORS;  Service: Urology;  Laterality: Left;  . IRRIGATION AND DEBRIDEMENT ABSCESS  07/27/2009   left index finger  . LEFT HEART CATHETERIZATION WITH CORONARY ANGIOGRAM N/A 11/02/2011   Procedure: LEFT HEART CATHETERIZATION WITH CORONARY ANGIOGRAM;  Surgeon: Lorretta Harp, MD;  Location: [facility] CATH LAB;  Service: Cardiovascular;  Laterality: N/A;  normal coronary arteries and lvf  . LUMBAR DISC SURGERY  10-15-2008  dr hirsch   redo laminectomy L4-5 w/ fusion/  revision L3-5 fusion  . LUMBAR FUSION  06-29-2001  dr hirsch   L3-4 diskectomy/ L1-2 lam. & disk./  fusion L1-4/  open reduction and internal fixation spondylolisthesis L2-3  . LUMBAR LAMINECTOMY/DECOMPRESSION MICRODISCECTOMY  02/29/2012   Procedure: LUMBAR LAMINECTOMY/DECOMPRESSION  MICRODISCECTOMY 1 LEVEL;  Surgeon: Otilio Connors, MD;  Location: [facility] NEURO ORS;  Service: Neurosurgery;  Laterality: Right;  Right Lumbar five-sacral oneDiskectomy  . [place]  . POSTERIOR FUSION CERVICAL SPINE  06/06/2006   C6-7 and C7-T1  . SPINE SURGERY  05-24-2000  dr hirsch   L3-4 laminectomy/ diskectomy;  T9-10 diskectomy/ fusion  . TONSILLECTOMY AND ADENOIDECTOMY  age 32  . TOTAL SHOULDER ARTHROPLASTY Left 01-08-2010  dr supple [facility]  . TRANSTHORACIC ECHOCARDIOGRAM  09-10-2016   dr berry   ef 60-65%/  mild AV calcification without stenosis, no regurg./  mild MR and TR/ mild LAE     Current Meds  Medication Sig  . amitriptyline (ELAVIL) 150 MG tablet Take 150 mg by mouth at bedtime.  . cyclobenzaprine (FLEXERIL) 10 MG tablet Take 10 mg by mouth at bedtime. For muscle spasms  . gabapentin (NEURONTIN) 300 MG capsule Take 300 mg by mouth 3 (three) times daily.   . metoprolol succinate (TOPROL-XL) 100 MG 24 hr tablet TAKE 1 TABLET BY MOUTH EVERY MORNING WITH OR IMMEDIATELY FOLLOWING A MEAL (Patient taking differently: Take 100 mg by mouth daily. )  . oxyCODONE-acetaminophen (PERCOCET) 7.5-325 MG  per tablet Take 1 tablet by mouth every 4 (four) hours as needed for pain.  . pantoprazole (PROTONIX) 40 MG tablet Take 1 tablet (40 mg total) by mouth 2 (two) times daily before a meal.     Allergies:   Butrans [buprenorphine] and Other   Social History   Tobacco Use  . Smoking status: Former Smoker    Packs/day: 1.00    Years: 12.00    Pack years: 12.00    Types: Cigarettes    Quit date: 04/26/1992    Years since quitting: 27.1  . Smokeless tobacco: Former Neurosurgeon    Types: Chew    Quit date: 04/26/1980  . Tobacco comment: used chew for 12 years before started smoking  Substance Use Topics  . Alcohol use: Yes    Comment: has one shot of Vodka before bed  . Drug use: No     Family Hx: The patient's family history includes Cancer in his maternal aunt and paternal  uncle; Heart disease in his brother, brother, father, maternal grandmother, maternal uncle, and maternal uncle; Hyperlipidemia in his brother, brother, and father; Stroke in his paternal grandfather.  ROS:   Please see the history of present illness.    All other systems reviewed and are negative.   Prior CV studies:   The following studies were reviewed today: Myoview 2018 Echo 2018  Labs/Other Tests and Data Reviewed:    EKG:  An ECG dated 05/17/2017 was personally reviewed today and demonstrated:  [id number]  Recent Labs: 01/26/2019: ALT 15 01/28/2019: BUN 5; Creatinine, Ser 0.93; Hemoglobin 8.9; Platelets 205; Potassium 3.5; Sodium 139   Recent Lipid Panel Lab Results  Component Value Date/Time   CHOL 160 08/31/2016 07:17 AM   TRIG 119 08/31/2016 07:17 AM   HDL 31 (L) 08/31/2016 07:17 AM   CHOLHDL 5.2 08/31/2016 07:17 AM   LDLCALC 105 (H) 08/31/2016 07:17 AM    Wt Readings from Last 3 Encounters:  06/06/19 184 lb (83.5 kg)  01/27/19 176 lb 2.4 oz (79.9 kg)  05/17/17 178 lb (80.7 kg)     Objective:    Vital Signs:  BP 128/72   Pulse 76   Ht 5\' 10"  (1.778 m)   Wt 184 lb (83.5 kg)   BMI 26.40 kg/m    VITAL SIGNS:  reviewed   ASSESSMENT & PLAN:    Palpitations- R/o arrhythmia  HTN- Controlled  Dyslipidemia On statin Rx followed by PCP  FM Hx of CAD- CAD and arrhythmia  PUD- With symptomatic anemia Oct 2020- ASA stopped.     COVID-19 Education: The signs and symptoms of COVID-19 were discussed with the patient and how to seek care for testing (follow up with PCP or arrange E-visit).  The importance of social distancing was discussed today.  Time:   Today, I have spent 15 minutes with the patient with telehealth technology discussing the above problems.     Medication Adjustments/Labs and Tests Ordered: Current medicines are reviewed at length with the patient today.  Concerns regarding medicines are outlined above.   Tests Ordered: No orders of the  defined types were placed in this encounter.   Medication Changes: No orders of the defined types were placed in this encounter.   Follow Up:  Virtual Visit  with me -4 weeks  Signed, Nov 2020, PA-C  06/06/2019 11:24 AM    [facility]

## 2019-06-06 NOTE — Telephone Encounter (Signed)
Contacted patient to discuss AVS Instructions. Gave patient Luke's recommendations from today's virtual office visit. Informed patient that someone from the scheduling dept will be in contact with them to schedule their follow up appt. Patient voiced understanding; AVS printed and mailed to patient.    

## 2019-06-06 NOTE — Patient Instructions (Addendum)
Medication Instructions:  Your physician recommends that you continue on your current medications as directed. Please refer to the Current Medication list given to you today. *If you need a refill on your cardiac medications before your next appointment, please call your pharmacy*  Lab Work: None  If you have labs (blood work) drawn today and your tests are completely normal, you will receive your results only by: Marland Kitchen MyChart Message (if you have MyChart) OR . A paper copy in the mail If you have any lab test that is abnormal or we need to change your treatment, we will call you to review the results.  Testing/Procedures: Christena Deem- Long Term Monitor Instructions   Your physician has requested you wear your ZIO patch monitor 7 days.   This is a single patch monitor.  [employer] supplies one patch monitor per enrollment.  Additional stickers are not available.   Please do not apply patch if you will be having a Nuclear Stress Test, Echocardiogram, Cardiac CT, MRI, or Chest Xray during the time frame you would be wearing the monitor. The patch cannot be worn during these tests.  You cannot remove and re-apply the ZIO XT patch monitor.   Your ZIO patch monitor will be sent USPS Priority mail from [facility] directly to your home address. The monitor may also be mailed to a PO BOX if home delivery is not available.   It may take 3-5 days to receive your monitor after you have been enrolled.   Once you have received you monitor, please review enclosed instructions.  Your monitor has already been registered assigning a specific monitor serial # to you.   Applying the monitor   Shave hair from upper left chest.   Hold abrader disc by orange tab.  Rub abrader in 40 strokes over left upper chest as indicated in your monitor instructions.   Clean area with 4 enclosed alcohol pads .  Use all pads to assure are is cleaned thoroughly.  Let dry.   Apply patch as indicated in monitor instructions.   Patch will be place under collarbone on left side of chest with arrow pointing upward.   Rub patch adhesive wings for 2 minutes.Remove white label marked "1".  Remove white label marked "2".  Rub patch adhesive wings for 2 additional minutes.   While looking in a mirror, press and release button in center of patch.  A small green light will flash 3-4 times .  This will be your only indicator the monitor has been turned on.     Do not shower for the first 24 hours.  You may shower after the first 24 hours.   Press button if you feel a symptom. You will hear a small click.  Record Date, Time and Symptom in the Patient Log Book.   When you are ready to remove patch, follow instructions on last 2 pages of Patient Log Book.  Stick patch monitor onto last page of Patient Log Book.   Place Patient Log Book in [place] box.  Use locking tab on box and tape box closed securely.  The [employer] has [employer] on it.  Please place in mailbox as soon as possible.  Your physician should have your test results approximately 7 days after the monitor has been mailed back to [facility].   Call [facility] Customer Care at [phone] if you have questions regarding your ZIO XT patch monitor.  Call them immediately if you see an orange  light blinking on your monitor.   If your monitor falls off in less than 4 days contact our Monitor department at [phone].  If your monitor becomes loose or falls off after 4 days call [employer] at [phone] for suggestions on securing your monitor.   This will be mailed to you, please expect 7-10 days to receive.        Follow-Up: At [facility], you and your health needs are our priority.  As part of our continuing mission to provide you with exceptional heart care, we have created designated Provider Care Teams.  These Care Teams include your primary Cardiologist (physician) and Advanced Practice Providers (APPs -  Physician Assistants and  Nurse Practitioners) who all work together to provide you with the care you need, when you need it.  Your next appointment:   4 week(s)  The format for your next appointment:   Virtual Visit   Provider:   Luke Kilroy, PA-C   Other Instructions  

## 2019-06-06 NOTE — Telephone Encounter (Signed)
Enrolled patient for a 7 day Zio monitor to be mailed to patients home.  

## 2019-06-17 ENCOUNTER — Other Ambulatory Visit (INDEPENDENT_AMBULATORY_CARE_PROVIDER_SITE_OTHER): Payer: Medicare Other

## 2019-06-17 DIAGNOSIS — R002 Palpitations: Secondary | ICD-10-CM | POA: Diagnosis not present

## 2019-06-17 DIAGNOSIS — K279 Peptic ulcer, site unspecified, unspecified as acute or chronic, without hemorrhage or perforation: Secondary | ICD-10-CM | POA: Diagnosis not present

## 2019-06-17 DIAGNOSIS — I10 Essential (primary) hypertension: Secondary | ICD-10-CM

## 2019-06-17 DIAGNOSIS — E785 Hyperlipidemia, unspecified: Secondary | ICD-10-CM | POA: Diagnosis not present

## 2019-07-03 DIAGNOSIS — R002 Palpitations: Secondary | ICD-10-CM | POA: Diagnosis not present

## 2019-07-04 ENCOUNTER — Other Ambulatory Visit: Payer: Self-pay

## 2019-07-04 ENCOUNTER — Telehealth: Payer: Medicare Other | Admitting: Cardiology

## 2019-07-10 ENCOUNTER — Telehealth: Payer: Medicare Other | Admitting: Cardiology

## 2019-07-10 ENCOUNTER — Telehealth: Payer: Self-pay

## 2019-07-10 ENCOUNTER — Telehealth (INDEPENDENT_AMBULATORY_CARE_PROVIDER_SITE_OTHER): Payer: Medicare Other | Admitting: Cardiology

## 2019-07-10 ENCOUNTER — Encounter: Payer: Self-pay | Admitting: Cardiology

## 2019-07-10 VITALS — Ht 70.0 in | Wt 175.0 lb

## 2019-07-10 DIAGNOSIS — I10 Essential (primary) hypertension: Secondary | ICD-10-CM

## 2019-07-10 DIAGNOSIS — Z862 Personal history of diseases of the blood and blood-forming organs and certain disorders involving the immune mechanism: Secondary | ICD-10-CM

## 2019-07-10 DIAGNOSIS — R002 Palpitations: Secondary | ICD-10-CM | POA: Diagnosis not present

## 2019-07-10 DIAGNOSIS — E785 Hyperlipidemia, unspecified: Secondary | ICD-10-CM

## 2019-07-10 NOTE — Patient Instructions (Addendum)
Medication Instructions:  Your physician recommends that you continue on your current medications as directed. Please refer to the Current Medication list given to you today.  *If you need a refill on your cardiac medications before your next appointment, please call your pharmacy*  Lab Work: Your physician recommends that you return for lab work this week:  BMET  CBC  TSH  If you have labs (blood work) drawn today and your tests are completely normal, you will receive your results only by: Marland Kitchen MyChart Message (if you have MyChart) OR . A paper copy in the mail If you have any lab test that is abnormal or we need to change your treatment, we will call you to review the results.   Testing/Procedures: NONE ordered at this time of appointment   Follow-Up: At [facility], you and your health needs are our priority.  As part of our continuing mission to provide you with exceptional heart care, we have created designated Provider Care Teams.  These Care Teams include your primary Cardiologist (physician) and Advanced Practice Providers (APPs -  Physician Assistants and Nurse Practitioners) who all work together to provide you with the care you need, when you need it.  We recommend signing up for the patient portal called "MyChart".  Sign up information is provided on this After Visit Summary.  MyChart is used to connect with patients for Virtual Visits (Telemedicine).  Patients are able to view lab/test results, encounter notes, upcoming appointments, etc.  Non-urgent messages can be sent to your provider as well.   To learn more about what you can do with MyChart, go to [email or web address].    Your next appointment:   2-3 week(s)  The format for your next appointment:   In Person  Provider:   Nanetta Batty, MD or Corine Shelter, PA-C  Other Instructions

## 2019-07-10 NOTE — Telephone Encounter (Signed)
This encounter was created in error - please disregard.

## 2019-07-10 NOTE — Telephone Encounter (Signed)
Called patient to discuss AVS instructions gave Dustin Carter recommendations and patient voiced understanding. AVS summary mailed to patient.  Patient was also schedule for his 2-3 week follow with Dr. Allyson Sabal.

## 2019-07-10 NOTE — Progress Notes (Signed)
Virtual Visit via Telephone Note   This visit type was conducted due to national recommendations for restrictions regarding the COVID-19 Pandemic (e.g. social distancing) in an effort to limit this patient's exposure and mitigate transmission in our community.  Due to his co-morbid illnesses, this patient is at least at moderate risk for complications without adequate follow up.  This format is felt to be most appropriate for this patient at this time.  The patient did not have access to video technology/had technical difficulties with video requiring transitioning to audio format only (telephone).  All issues noted in this document were discussed and addressed.  No physical exam could be performed with this format.  Please refer to the patient's chart for his  consent to telehealth for [facility].   The patient was identified using 2 identifiers.  Date:  07/10/2019   ID:  Dustin Carter, DOB 1955-07-27, MRN [id number]  Patient Location: Home Provider Location: Home  PCP:  Merri Brunette, MD  Cardiologist:  Nanetta Batty, MD  Electrophysiologist:  None   Evaluation Performed:  Follow-Up Visit  Chief Complaint:  palpitations  History of Present Illness:    Dustin Carter is a 64 y.o. male with a history of palpitations.  He had a stress test in 2018 that was low risk.  Echocardiogram in 2018 showed preserved LV function.  He has been treated with beta-blocker-Toprol 100 mg daily.  He has controlled hypertension and is on a statin for lipid therapy.  In October 2020 he was admitted with symptomatic anemia and found to have peptic ulcer disease.  He had been on an aspirin at that time and this was stopped.  He was contacted in Feb 2021 after he complained of palpitations, mainly at night.  An OP monitor was obtained and he is contacted today for follow up.  His monitor showed short runs of PSVT and episodes of sinus tachycardia and bradycardia.  He tells me today he continues to have  palpitations. His B/P at runs 140 systolic last he checked.   The patient does not have symptoms concerning for COVID-19 infection (fever, chills, cough, or new shortness of breath).    Past Medical History:  Diagnosis Date  . Arthritis    SPINE AND "HEAD TO TOES"  . Atypical chest pain at rest   cardiologist-- dr berry-- work up done per note all normal (stress test and cardiac cath)  . BPH (benign prostatic hypertrophy) Mar 29, 2011   TREATED IN DR. TANNENBAUM'S OFFICE WITH "HEAT" THERAPY TO SHRINK THE PROSTATE  . DOE (dyspnea on exertion)   . Family history of adverse reaction to anesthesia    MOTHER--- HARD TO WAKE  . Full dentures   . GERD (gastroesophageal reflux disease)   . History of kidney stones   . Hyperlipidemia   . Hypertension   . Polyneuropathy    DUE TO POST SURGERY'S  . Renal calculi    bilateral   . Sinus tachycardia cardiologist- dr berry   hx palpitations w/ holter event monitor showed SR, SB, runs of PSVT-- pt takes beta blocker  . Wears glasses    Past Surgical History:  Procedure Laterality Date  . ANTERIOR CERVICAL DECOMP/DISCECTOMY FUSION  03/09/2004;  04-29-2008   C5-6 (03-09-2004)  C3-4 (04-29-2008  . BIOPSY  01/28/2019   Procedure: BIOPSY;  Surgeon: Graylin Shiver, MD;  Location[facility] ENDOSCOPY;  Service: Endoscopy;;  . CARDIOVASCULAR STRESS TEST  08-30-2016  dr berry   Low risk nuclear  study w/ normal perfusion and mild reduced LV global systolic function (study was gated , no percentage on results) recommend echo  . CERVICAL FUSION  11/01/2005   C6-7 diskecotmy, decompression and fusion/  removal hardware C5-6  . CHOLECYSTECTOMY  12/29/2011   Procedure: LAPAROSCOPIC CHOLECYSTECTOMY WITH INTRAOPERATIVE CHOLANGIOGRAM;  Surgeon: Atilano Ina, MD,FACS;  Location: [facility] ORS;  Service: General;  Laterality: N/A;  . CYSTOSCOPY W/ URETERAL STENT PLACEMENT Left 12-05-1999;  01-02-2000  . CYSTOSCOPY/RETROGRADE/URETEROSCOPY/STONE EXTRACTION WITH BASKET Left  04/15/2014   Procedure: CYSTOSCOPY,  LEFT RETROGRADE PYELOGRAM,  LEFT URETEROSCOPY,  LEFT STONE EXTRACTION WITH BASKET, LEFT STENT PLACEMENT ;  Surgeon: Kathi Ludwig, MD;  Location: [facility] ORS;  Service: Urology;  Laterality: Left;  . CYSTOSCOPY/RETROGRADE/URETEROSCOPY/STONE EXTRACTION WITH BASKET Bilateral left 12-14-1999;  right 11-10-2007  dr Patsi Sears  . CYSTOSCOPY/URETEROSCOPY/HOLMIUM LASER/STENT PLACEMENT Bilateral 12/29/2016   Procedure: CYSTOSCOPY RIGHT URETEROSCOPY POSSIBLE LEFT  WITH HOLMIUM LASER AND URETERAL STENT PLACEMENT;  Surgeon: Crista Elliot, MD;  Location: [facility];  Service: Urology;  Laterality: Bilateral;  . ESOPHAGOGASTRODUODENOSCOPY (EGD) WITH PROPOFOL N/A 01/28/2019   Procedure: ESOPHAGOGASTRODUODENOSCOPY (EGD) WITH PROPOFOL;  Surgeon: Graylin Shiver, MD;  Location: [facility] ENDOSCOPY;  Service: Endoscopy;  Laterality: N/A;  . HOLMIUM LASER APPLICATION Left 04/15/2014   Procedure: HOLMIUM LASER APPLICATION;  Surgeon: Kathi Ludwig, MD;  Location: [facility] ORS;  Service: Urology;  Laterality: Left;  . IRRIGATION AND DEBRIDEMENT ABSCESS  07/27/2009   left index finger  . LEFT HEART CATHETERIZATION WITH CORONARY ANGIOGRAM N/A 11/02/2011   Procedure: LEFT HEART CATHETERIZATION WITH CORONARY ANGIOGRAM;  Surgeon: Runell Gess, MD;  Location: [facility] CATH LAB;  Service: Cardiovascular;  Laterality: N/A;  normal coronary arteries and lvf  . LUMBAR DISC SURGERY  10-15-2008  dr hirsch   redo laminectomy L4-5 w/ fusion/  revision L3-5 fusion  . LUMBAR FUSION  06-29-2001  dr hirsch   L3-4 diskectomy/ L1-2 lam. & disk./  fusion L1-4/  open reduction and internal fixation spondylolisthesis L2-3  . LUMBAR LAMINECTOMY/DECOMPRESSION MICRODISCECTOMY  02/29/2012   Procedure: LUMBAR LAMINECTOMY/DECOMPRESSION MICRODISCECTOMY 1 LEVEL;  Surgeon: Clydene Fake, MD;  Location: [facility] NEURO ORS;  Service: Neurosurgery;  Laterality: Right;  Right Lumbar five-sacral oneDiskectomy  .  LUMBAR SPINE SURGERY  1997  . POSTERIOR FUSION CERVICAL SPINE  06/06/2006   C6-7 and C7-T1  . SPINE SURGERY  05-24-2000  dr hirsch   L3-4 laminectomy/ diskectomy;  T9-10 diskectomy/ fusion  . TONSILLECTOMY AND ADENOIDECTOMY  age 12  . TOTAL SHOULDER ARTHROPLASTY Left 01-08-2010  dr supple [facility]  . TRANSTHORACIC ECHOCARDIOGRAM  09-10-2016   dr berry   ef 60-65%/  mild AV calcification without stenosis, no regurg./  mild MR and TR/ mild LAE     Current Meds  Medication Sig  . amitriptyline (ELAVIL) 150 MG tablet Take 150 mg by mouth at bedtime.  Marland Kitchen atorvastatin (LIPITOR) 40 MG tablet Take 40 mg by mouth at bedtime.  . cyclobenzaprine (FLEXERIL) 10 MG tablet Take 10 mg by mouth at bedtime. For muscle spasms  . ferrous sulfate 220 (44 Fe) MG/5ML solution Take 440 mg by mouth every morning.  . gabapentin (NEURONTIN) 300 MG capsule Take 300 mg by mouth 3 (three) times daily.   . meloxicam (MOBIC) 15 MG tablet Take 15 mg by mouth daily.  . metoprolol succinate (TOPROL-XL) 100 MG 24 hr tablet TAKE 1 TABLET BY MOUTH EVERY MORNING WITH OR IMMEDIATELY FOLLOWING A MEAL (Patient taking differently: Take 100 mg by mouth  daily. )  . oxyCODONE-acetaminophen (PERCOCET) 7.5-325 MG per tablet Take 1 tablet by mouth every 4 (four) hours as needed for pain.  Marland Kitchen oxyCODONE-acetaminophen (PERCOCET) 7.5-325 MG tablet SMARTSIG:1 Tablet(s) By Mouth Every 6 Hours PRN  . pantoprazole (PROTONIX) 40 MG tablet Take 1 tablet (40 mg total) by mouth 2 (two) times daily before a meal.     Allergies:   Butrans [buprenorphine] and Other   Social History   Tobacco Use  . Smoking status: Former Smoker    Packs/day: 1.00    Years: 12.00    Pack years: 12.00    Types: Cigarettes    Quit date: 04/26/1992    Years since quitting: 27.2  . Smokeless tobacco: Former Neurosurgeon    Types: Chew    Quit date: 04/26/1980  . Tobacco comment: used chew for 12 years before started smoking  Substance Use Topics  . Alcohol use: Yes     Comment: has one shot of Vodka before bed  . Drug use: No     Family Hx: The patient's family history includes Cancer in his maternal aunt and paternal uncle; Heart disease in his brother, brother, father, maternal grandmother, maternal uncle, and maternal uncle; Hyperlipidemia in his brother, brother, and father; Stroke in his paternal grandfather.  ROS:   Please see the history of present illness.    All other systems reviewed and are negative.   Prior CV studies:   The following studies were reviewed today: Echo 2018 Myoview 2018  Labs/Other Tests and Data Reviewed:    EKG:  An ECG dated 05/17/2017 was personally reviewed today and demonstrated:  NSR, 68  Recent Labs: 01/26/2019: ALT 15 01/28/2019: BUN 5; Creatinine, Ser 0.93; Hemoglobin 8.9; Platelets 205; Potassium 3.5; Sodium 139   Recent Lipid Panel Lab Results  Component Value Date/Time   CHOL 160 08/31/2016 07:17 AM   TRIG 119 08/31/2016 07:17 AM   HDL 31 (L) 08/31/2016 07:17 AM   CHOLHDL 5.2 08/31/2016 07:17 AM   LDLCALC 105 (H) 08/31/2016 07:17 AM    Wt Readings from Last 3 Encounters:  07/10/19 175 lb (79.4 kg)  06/06/19 184 lb (83.5 kg)  01/27/19 176 lb 2.4 oz (79.9 kg)     Objective:    Vital Signs:  Ht 5\' 10"  (1.778 m)   Wt 175 lb (79.4 kg)   BMI 25.11 kg/m    VITAL SIGNS:  reviewed  ASSESSMENT & PLAN:    Palpitations- PSVT documented on recent outpatient monitor.  His symptoms seem to be mainly at night.  I suggested he take his Toprol around 5 or 6 in the evening.  I think he should come into the office for an EKG and also follow-up labs.  I am hesitant to add diltiazem without checking his blood pressure.  We will also need to interview him for possible sleep apnea.  HTN- Controlled-?  Dyslipidemia On statin Rx followed by PCP  FM Hx of CAD- CAD and arrhythmia  PUD- With symptomatic anemia Oct 2020- ASA stopped.   Plan:  Take Toprol at 5-6pm daily.  CBC, BMP, TSH.  OV with me in 1-2  weeks.   COVID-19 Education: The signs and symptoms of COVID-19 were discussed with the patient and how to seek care for testing (follow up with PCP or arrange E-visit).  The importance of social distancing was discussed today.  Time:   Today, I have spent 15 minutes with the patient with telehealth technology discussing the above problems.  Medication Adjustments/Labs and Tests Ordered: Current medicines are reviewed at length with the patient today.  Concerns regarding medicines are outlined above.   Tests Ordered: No orders of the defined types were placed in this encounter.   Medication Changes: No orders of the defined types were placed in this encounter.   Follow Up:  In Person one to two weeks  Signed, Kerin Ransom, Hershal Coria  07/10/2019 9:57 AM    [place]

## 2019-07-12 ENCOUNTER — Ambulatory Visit: Payer: Medicare Other | Attending: Internal Medicine

## 2019-07-12 DIAGNOSIS — Z23 Encounter for immunization: Secondary | ICD-10-CM

## 2019-07-12 NOTE — Progress Notes (Signed)
   Covid-19 Vaccination Clinic  Name:  Dustin Carter    MRN: [id number] DOB: 06/30/1955  07/12/2019  [name] was observed post Covid-19 immunization for 15 minutes without incident. He was provided with Vaccine Information Sheet and instruction to access the V-Safe system.   [name] was instructed to call 911 with any severe reactions post vaccine: Marland Kitchen Difficulty breathing  . Swelling of face and throat  . A fast heartbeat  . A bad rash all over body  . Dizziness and weakness   Immunizations Administered    Name Date Dose VIS Date Route   Pfizer COVID-19 Vaccine 07/12/2019  8:08 AM 0.3 mL 04/06/2019 Intramuscular   Manufacturer: [employer], [employer]   Lot: [id number]   NDC: [address]-3

## 2019-07-30 DIAGNOSIS — M542 Cervicalgia: Secondary | ICD-10-CM | POA: Diagnosis not present

## 2019-07-30 DIAGNOSIS — Q761 Klippel-Feil syndrome: Secondary | ICD-10-CM | POA: Diagnosis not present

## 2019-08-03 ENCOUNTER — Ambulatory Visit: Payer: Medicare Other | Admitting: Cardiovascular Disease

## 2019-08-06 ENCOUNTER — Ambulatory Visit: Payer: Medicare Other | Attending: Internal Medicine

## 2019-08-06 DIAGNOSIS — Z23 Encounter for immunization: Secondary | ICD-10-CM

## 2019-08-06 NOTE — Progress Notes (Signed)
   Covid-19 Vaccination Clinic  Name:  Dustin Carter    MRN: [id number] DOB: [birth year]  08/06/2019  [name] was observed post Covid-19 immunization for 15 minutes without incident. He was provided with Vaccine Information Sheet and instruction to access the V-Safe system.   [name] was instructed to call 911 with any severe reactions post vaccine: Marland Kitchen Difficulty breathing  . Swelling of face and throat  . A fast heartbeat  . A bad rash all over body  . Dizziness and weakness   Immunizations Administered    Name Date Dose VIS Date Route   Pfizer COVID-19 Vaccine 08/06/2019  8:24 AM 0.3 mL 04/06/2019 Intramuscular   Manufacturer: [employer], [employer]   Lot: [id number]   NDC: [address]-5

## 2019-08-30 DIAGNOSIS — Q761 Klippel-Feil syndrome: Secondary | ICD-10-CM | POA: Diagnosis not present

## 2019-08-30 DIAGNOSIS — M545 Low back pain: Secondary | ICD-10-CM | POA: Diagnosis not present

## 2019-08-30 DIAGNOSIS — M461 Sacroiliitis, not elsewhere classified: Secondary | ICD-10-CM | POA: Diagnosis not present

## 2019-11-05 DIAGNOSIS — Q761 Klippel-Feil syndrome: Secondary | ICD-10-CM | POA: Diagnosis not present

## 2019-11-05 DIAGNOSIS — M542 Cervicalgia: Secondary | ICD-10-CM | POA: Diagnosis not present

## 2019-12-03 DIAGNOSIS — M19011 Primary osteoarthritis, right shoulder: Secondary | ICD-10-CM | POA: Diagnosis not present

## 2019-12-03 DIAGNOSIS — Z96612 Presence of left artificial shoulder joint: Secondary | ICD-10-CM | POA: Diagnosis not present

## 2019-12-05 DIAGNOSIS — Q761 Klippel-Feil syndrome: Secondary | ICD-10-CM | POA: Diagnosis not present

## 2019-12-05 DIAGNOSIS — M545 Low back pain: Secondary | ICD-10-CM | POA: Diagnosis not present

## 2019-12-05 DIAGNOSIS — M461 Sacroiliitis, not elsewhere classified: Secondary | ICD-10-CM | POA: Diagnosis not present

## 2019-12-10 NOTE — Progress Notes (Signed)
DUE TO COVID-19 ONLY ONE VISITOR IS ALLOWED TO COME WITH YOU AND STAY IN THE WAITING ROOM ONLY DURING PRE OP AND PROCEDURE DAY OF SURGERY. THE 1 VISITOR  MAY VISIT WITH YOU AFTER SURGERY IN YOUR PRIVATE ROOM DURING VISITING HOURS ONLY!  YOU NEED TO HAVE A COVID 19 TEST ON_______ @_______ , THIS TEST MUST BE DONE BEFORE SURGERY,  COVID TESTING SITE [address] [place] , IT IS ON THE RIGHT GOING OUT [address] APPROXIMATELY  2 MINUTES PAST [employer] ON THE RIGHT. ONCE YOUR COVID TEST IS COMPLETED,  PLEASE BEGIN THE QUARANTINE INSTRUCTIONS AS OUTLINED IN YOUR HANDOUT.                Dustin Carter  12/10/2019   Your procedure is scheduled on:  12/20/2019   Report to [facility] Main  Entrance   Report to admitting at     0740am     Call this number if you have problems the morning of surgery [phone]    Remember: Do not eat food    :After Midnight. BRUSH YOUR TEETH MORNING OF SURGERY AND RINSE YOUR MOUTH OUT, NO CHEWING GUM CANDY OR MINTS.  NO SOLID FOOD AFTER MIDNIGHT THE NIGHT PRIOR TO SURGERY. NOTHING BY MOUTH EXCEPT CLEAR LIQUIDS UNTIL .    0710am PLEASE FINISH ENSURE DRINK PER SURGEON ORDER  WHICH NEEDS TO BE COMPLETED AT .0710am    CLEAR LIQUID DIET   Foods Allowed                                                                    Coffee and tea, regular and decaf                             Plain Jell-O any favor except red or purple                                    Fruit ices (not with fruit pulp)                                   Iced Popsicles                                    Carbonated beverages, regular and diet                                    Cranberry, grape and apple juices Sports drinks like Gatorade Lightly seasoned clear broth or consume(fat free) Sugar, honey syrup  _____________________________________________________________________    Take these medicines the morning of surgery with A SIP OF WATER:  Gabapentin, Toprol, protonix                                 You may not have any metal on your body including hair pins and              piercings  Do not wear jewelry, make-up, lotions, powders or perfumes, deodorant             Do not wear nail polish on your fingernails.  Do not shave  48 hours prior to surgery.              Men may shave face and neck.   Do not bring valuables to the hospital. [facility] IS NOT             RESPONSIBLE   FOR VALUABLES.  Contacts, dentures or bridgework may not be worn into surgery.  Leave suitcase in the car. After surgery it may be brought to your room.     Patients discharged the day of surgery will not be allowed to drive home. IF YOU ARE HAVING SURGERY AND GOING HOME THE SAME DAY, YOU MUST HAVE AN ADULT TO DRIVE YOU HOME AND BE WITH YOU FOR 24 HOURS. YOU MAY GO HOME BY TAXI OR UBER OR ORTHERWISE, BUT AN ADULT MUST ACCOMPANY YOU HOME AND STAY WITH YOU FOR 24 HOURS.  Name and phone number of your driver:                Please read over the following fact sheets you were given: _____________________________________________________________________  [facility] - Preparing for Surgery Before surgery, you can play an important role.  Because skin is not sterile, your skin needs to be as free of germs as possible.  You can reduce the number of germs on your skin by washing with CHG (chlorahexidine gluconate) soap before surgery.  CHG is an antiseptic cleaner which kills germs and bonds with the skin to continue killing germs even after washing. Please DO NOT use if you have an allergy to CHG or antibacterial soaps.  If your skin becomes reddened/irritated stop using the CHG and inform your nurse when you arrive at Short Stay. Do not shave (including legs and underarms) for at least 48 hours prior to the first CHG  shower.  You may shave your face/neck. Please follow these instructions carefully:  1.  Shower with CHG Soap the night before surgery and the  morning of Surgery.  2.  If you choose to wash your hair, wash your hair first as usual with your  normal  shampoo.  3.  After you shampoo, rinse your hair and body thoroughly to remove the  shampoo.                           4.  Use CHG as you would any other liquid soap.  You can apply chg directly  to the skin and wash                       Gently with a scrungie or clean washcloth.  5.  Apply the CHG Soap to your body ONLY FROM THE NECK DOWN.   Do not use on face/ open  Wound or open sores. Avoid contact with eyes, ears mouth and genitals (private parts).                       Wash face,  Genitals (private parts) with your normal soap.             6.  Wash thoroughly, paying special attention to the area where your surgery  will be performed.  7.  Thoroughly rinse your body with warm water from the neck down.  8.  DO NOT shower/wash with your normal soap after using and rinsing off  the CHG Soap.                9.  Pat yourself dry with a clean towel.            10.  Wear clean pajamas.            11.  Place clean sheets on your bed the night of your first shower and do not  sleep with pets. Day of Surgery : Do not apply any lotions/deodorants the morning of surgery.  Please wear clean clothes to the hospital/surgery center.  FAILURE TO FOLLOW THESE INSTRUCTIONS MAY RESULT IN THE CANCELLATION OF YOUR SURGERY PATIENT SIGNATURE_________________________________  NURSE SIGNATURE__________________________________  ________________________________________________________________________   Dustin Carter  An incentive spirometer is a tool that can help keep your lungs clear and active. This tool measures how well you are filling your lungs with each breath. Taking long deep breaths may help reverse or decrease the chance  of developing breathing (pulmonary) problems (especially infection) following:  A long period of time when you are unable to move or be active. BEFORE THE PROCEDURE   If the spirometer includes an indicator to show your best effort, your nurse or respiratory therapist will set it to a desired goal.  If possible, sit up straight or lean slightly forward. Try not to slouch.  Hold the incentive spirometer in an upright position. INSTRUCTIONS FOR USE  1. Sit on the edge of your bed if possible, or sit up as far as you can in bed or on a chair. 2. Hold the incentive spirometer in an upright position. 3. Breathe out normally. 4. Place the mouthpiece in your mouth and seal your lips tightly around it. 5. Breathe in slowly and as deeply as possible, raising the piston or the ball toward the top of the column. 6. Hold your breath for 3-5 seconds or for as long as possible. Allow the piston or ball to fall to the bottom of the column. 7. Remove the mouthpiece from your mouth and breathe out normally. 8. Rest for a few seconds and repeat Steps 1 through 7 at least 10 times every 1-2 hours when you are awake. Take your time and take a few normal breaths between deep breaths. 9. The spirometer may include an indicator to show your best effort. Use the indicator as a goal to work toward during each repetition. 10. After each set of 10 deep breaths, practice coughing to be sure your lungs are clear. If you have an incision (the cut made at the time of surgery), support your incision when coughing by placing a pillow or rolled up towels firmly against it. Once you are able to get out of bed, walk around indoors and cough well. You may stop using the incentive spirometer when instructed by your caregiver.  RISKS AND COMPLICATIONS  Take your time so you do not get  dizzy or light-headed.  If you are in pain, you may need to take or ask for pain medication before doing incentive spirometry. It is harder to  take a deep breath if you are having pain. AFTER USE  Rest and breathe slowly and easily.  It can be helpful to keep track of a log of your progress. Your caregiver can provide you with a simple table to help with this. If you are using the spirometer at home, follow these instructions: SEEK MEDICAL CARE IF:   You are having difficultly using the spirometer.  You have trouble using the spirometer as often as instructed.  Your pain medication is not giving enough relief while using the spirometer.  You develop fever of 100.5 F (38.1 C) or higher. SEEK IMMEDIATE MEDICAL CARE IF:   You cough up bloody sputum that had not been present before.  You develop fever of 102 F (38.9 C) or greater.  You develop worsening pain at or near the incision site. MAKE SURE YOU:   Understand these instructions.  Will watch your condition.  Will get help right away if you are not doing well or get worse. Document Released: 08/23/2006 Document Revised: 07/05/2011 Document Reviewed: 10/24/2006 ExitCare Patient Information 2014 [place] [employer].   ________________________________________________________________________ [facility]- Preparing for Total Shoulder Arthroplasty    Before surgery, you can play an important role. Because skin is not sterile, your skin needs to be as free of germs as possible. You can reduce the number of germs on your skin by using the following products. . Benzoyl Peroxide Gel o Reduces the number of germs present on the skin o Applied twice a day to shoulder area starting two days before surgery    ==================================================================  Please follow these instructions carefully:  BENZOYL PEROXIDE 5% GEL  Please do not use if you have an allergy to benzoyl peroxide.   If your skin becomes reddened/irritated stop using the benzoyl peroxide.  Starting two days before surgery, apply as follows: 1. Apply benzoyl peroxide in the  morning and at night. Apply after taking a shower. If you are not taking a shower clean entire shoulder front, back, and side along with the armpit with a clean wet washcloth.  2. Place a quarter-sized dollop on your shoulder and rub in thoroughly, making sure to cover the front, back, and side of your shoulder, along with the armpit.   2 days before ____ AM   ____ PM              1 day before ____ AM   ____ PM                         3. Do this twice a day for two days.  (Last application is the night before surgery, AFTER using the CHG soap as described below).  4. Do NOT apply benzoyl peroxide gel on the day of surgery.

## 2019-12-11 ENCOUNTER — Encounter (HOSPITAL_COMMUNITY)
Admission: RE | Admit: 2019-12-11 | Discharge: 2019-12-11 | Disposition: A | Discharge status: Home / Self Care | Payer: Medicare Other | Source: Ambulatory Visit | Attending: Orthopedic Surgery | Admitting: Orthopedic Surgery

## 2019-12-11 ENCOUNTER — Other Ambulatory Visit: Payer: Self-pay

## 2019-12-11 ENCOUNTER — Encounter (HOSPITAL_COMMUNITY): Payer: Self-pay

## 2019-12-11 DIAGNOSIS — Z01818 Encounter for other preprocedural examination: Secondary | ICD-10-CM | POA: Diagnosis not present

## 2019-12-11 HISTORY — DX: Other specified postprocedural states: Z98.890

## 2019-12-11 HISTORY — DX: Other specified postprocedural states: R11.2

## 2019-12-11 LAB — BASIC METABOLIC PANEL
Anion gap: 7 (ref 5–15)
BUN: 8 mg/dL (ref 8–23)
CO2: 29 mmol/L (ref 22–32)
Calcium: 8.9 mg/dL (ref 8.9–10.3)
Chloride: 103 mmol/L (ref 98–111)
Creatinine, Ser: 0.94 mg/dL (ref 0.61–1.24)
GFR calc Af Amer: >60 mL/min (ref >60)
GFR calc non Af Amer: >60 mL/min (ref >60)
Glucose, Bld: 103 mg/dL — ABNORMAL HIGH (ref 70–99)
Potassium: 4.3 mmol/L (ref 3.5–5.1)
Sodium: 139 mmol/L (ref 135–145)

## 2019-12-11 LAB — CBC
HCT: 42 % (ref 39.0–52.0)
Hemoglobin: 14 g/dL (ref 13.0–17.0)
MCH: 30.4 pg (ref 26.0–34.0)
MCHC: 33.3 g/dL (ref 30.0–36.0)
MCV: 91.3 fL (ref 80.0–100.0)
Platelets: 182 10*3/uL (ref 150–400)
RBC: 4.6 MIL/uL (ref 4.22–5.81)
RDW: 12.6 % (ref 11.5–15.5)
WBC: 6.5 10*3/uL (ref 4.0–10.5)
nRBC: 0 % (ref 0.0–0.2)

## 2019-12-11 LAB — SURGICAL PCR SCREEN
MRSA, PCR: NEGATIVE
Staphylococcus aureus: NEGATIVE

## 2019-12-12 NOTE — Progress Notes (Signed)
Anesthesia Review:  PCP:DR Merri Brunette  Cardiologist : LOV- 07/10/2019 with Sherre Poot  Chest x-ray : EKG :12/11/19  Echo : Stress test:2018  Cardiac Cath :  Activity level: can do a flight of stairs without difficulty  Sleep Study/ CPAP : Fasting Blood Sugar :      / Checks Blood Sugar -- times a day:   Blood Thinner/ Instructions /Last Dose: ASA / Instructions/ Last Dose :

## 2019-12-17 ENCOUNTER — Other Ambulatory Visit (HOSPITAL_COMMUNITY)
Admission: RE | Admit: 2019-12-17 | Discharge: 2019-12-17 | Disposition: A | Discharge status: Home / Self Care | Payer: Medicare Other | Source: Ambulatory Visit | Attending: Orthopedic Surgery | Admitting: Orthopedic Surgery

## 2019-12-17 DIAGNOSIS — Z20822 Contact with and (suspected) exposure to covid-19: Secondary | ICD-10-CM | POA: Insufficient documentation

## 2019-12-17 DIAGNOSIS — Z01812 Encounter for preprocedural laboratory examination: Secondary | ICD-10-CM | POA: Diagnosis not present

## 2019-12-17 LAB — SARS CORONAVIRUS 2 (TAT 6-24 HRS): SARS Coronavirus 2: NEGATIVE

## 2019-12-20 ENCOUNTER — Ambulatory Visit (HOSPITAL_COMMUNITY): Payer: Medicare Other | Admitting: Registered Nurse

## 2019-12-20 ENCOUNTER — Ambulatory Visit (HOSPITAL_COMMUNITY)
Admission: RE | Admit: 2019-12-20 | Discharge: 2019-12-20 | Disposition: A | Discharge status: Home / Self Care | Payer: Medicare Other | Attending: Orthopedic Surgery | Admitting: Orthopedic Surgery

## 2019-12-20 ENCOUNTER — Encounter (HOSPITAL_COMMUNITY)
Admission: RE | Disposition: A | Discharge status: Home / Self Care | Payer: Self-pay | Source: Home / Self Care | Attending: Orthopedic Surgery

## 2019-12-20 ENCOUNTER — Other Ambulatory Visit: Payer: Self-pay

## 2019-12-20 ENCOUNTER — Encounter (HOSPITAL_COMMUNITY): Payer: Self-pay | Admitting: Orthopedic Surgery

## 2019-12-20 ENCOUNTER — Ambulatory Visit (HOSPITAL_COMMUNITY): Payer: Medicare Other | Admitting: Physician Assistant

## 2019-12-20 DIAGNOSIS — M19011 Primary osteoarthritis, right shoulder: Secondary | ICD-10-CM | POA: Insufficient documentation

## 2019-12-20 DIAGNOSIS — Z87891 Personal history of nicotine dependence: Secondary | ICD-10-CM | POA: Insufficient documentation

## 2019-12-20 DIAGNOSIS — K219 Gastro-esophageal reflux disease without esophagitis: Secondary | ICD-10-CM | POA: Insufficient documentation

## 2019-12-20 DIAGNOSIS — Z981 Arthrodesis status: Secondary | ICD-10-CM | POA: Diagnosis not present

## 2019-12-20 DIAGNOSIS — E785 Hyperlipidemia, unspecified: Secondary | ICD-10-CM | POA: Insufficient documentation

## 2019-12-20 DIAGNOSIS — G8918 Other acute postprocedural pain: Secondary | ICD-10-CM | POA: Diagnosis not present

## 2019-12-20 DIAGNOSIS — I10 Essential (primary) hypertension: Secondary | ICD-10-CM | POA: Diagnosis not present

## 2019-12-20 DIAGNOSIS — Z79899 Other long term (current) drug therapy: Secondary | ICD-10-CM | POA: Insufficient documentation

## 2019-12-20 HISTORY — PX: TOTAL SHOULDER ARTHROPLASTY: SHX126

## 2019-12-20 SURGERY — ARTHROPLASTY, SHOULDER, TOTAL
Anesthesia: Regional | Site: Shoulder | Laterality: Right

## 2019-12-20 MED ORDER — MIDAZOLAM HCL 2 MG/2ML IJ SOLN
1.0000 mg | INTRAMUSCULAR | Status: DC
  Administered 2019-12-20: 2 mg via INTRAVENOUS
  Filled 2019-12-20: qty 2

## 2019-12-20 MED ORDER — LIDOCAINE 2% (20 MG/ML) 5 ML SYRINGE
INTRAMUSCULAR | Status: DC | PRN
  Administered 2019-12-20: 100 mg via INTRAVENOUS

## 2019-12-20 MED ORDER — LIDOCAINE 2% (20 MG/ML) 5 ML SYRINGE
INTRAMUSCULAR | Status: AC
  Filled 2019-12-20: qty 5

## 2019-12-20 MED ORDER — ROCURONIUM BROMIDE 10 MG/ML (PF) SYRINGE
PREFILLED_SYRINGE | INTRAVENOUS | Status: DC | PRN
  Administered 2019-12-20: 80 mg via INTRAVENOUS

## 2019-12-20 MED ORDER — SUCCINYLCHOLINE CHLORIDE 200 MG/10ML IV SOSY
PREFILLED_SYRINGE | INTRAVENOUS | Status: AC
  Filled 2019-12-20: qty 10

## 2019-12-20 MED ORDER — EPHEDRINE 5 MG/ML INJ
INTRAVENOUS | Status: AC
  Filled 2019-12-20: qty 10

## 2019-12-20 MED ORDER — CHLORHEXIDINE GLUCONATE 0.12 % MT SOLN
15.0000 mL | Freq: Once | OROMUCOSAL | Status: AC
  Administered 2019-12-20: 15 mL via OROMUCOSAL

## 2019-12-20 MED ORDER — 0.9 % SODIUM CHLORIDE (POUR BTL) OPTIME
TOPICAL | Status: DC | PRN
  Administered 2019-12-20: 1000 mL

## 2019-12-20 MED ORDER — TRANEXAMIC ACID-NACL 1000-0.7 MG/100ML-% IV SOLN
1000.0000 mg | INTRAVENOUS | Status: AC
  Administered 2019-12-20: 1000 mg via INTRAVENOUS
  Filled 2019-12-20: qty 100

## 2019-12-20 MED ORDER — ONDANSETRON HCL 4 MG/2ML IJ SOLN
INTRAMUSCULAR | Status: DC | PRN
  Administered 2019-12-20: 4 mg via INTRAVENOUS

## 2019-12-20 MED ORDER — PHENYLEPHRINE 40 MCG/ML (10ML) SYRINGE FOR IV PUSH (FOR BLOOD PRESSURE SUPPORT)
PREFILLED_SYRINGE | INTRAVENOUS | Status: AC
  Filled 2019-12-20: qty 10

## 2019-12-20 MED ORDER — DEXAMETHASONE SODIUM PHOSPHATE 10 MG/ML IJ SOLN
INTRAMUSCULAR | Status: DC | PRN
  Administered 2019-12-20: 10 mg via INTRAVENOUS

## 2019-12-20 MED ORDER — MELOXICAM 15 MG PO TABS
15.0000 mg | ORAL_TABLET | Freq: Every day | ORAL | 1 refills | Status: DC
Start: 2019-12-20 — End: 2022-08-25

## 2019-12-20 MED ORDER — STERILE WATER FOR IRRIGATION IR SOLN
Status: DC | PRN
  Administered 2019-12-20: 2000 mL

## 2019-12-20 MED ORDER — SUGAMMADEX SODIUM 200 MG/2ML IV SOLN
INTRAVENOUS | Status: DC | PRN
  Administered 2019-12-20: 200 mg via INTRAVENOUS

## 2019-12-20 MED ORDER — BUPIVACAINE HCL (PF) 0.5 % IJ SOLN
INTRAMUSCULAR | Status: DC | PRN
  Administered 2019-12-20: 20 mL

## 2019-12-20 MED ORDER — PROPOFOL 10 MG/ML IV BOLUS
INTRAVENOUS | Status: AC
  Filled 2019-12-20: qty 20

## 2019-12-20 MED ORDER — CEFAZOLIN SODIUM-DEXTROSE 2-4 GM/100ML-% IV SOLN
2.0000 g | INTRAVENOUS | Status: AC
  Administered 2019-12-20: 2 g via INTRAVENOUS
  Filled 2019-12-20: qty 100

## 2019-12-20 MED ORDER — ROCURONIUM BROMIDE 10 MG/ML (PF) SYRINGE
PREFILLED_SYRINGE | INTRAVENOUS | Status: AC
  Filled 2019-12-20: qty 10

## 2019-12-20 MED ORDER — ORAL CARE MOUTH RINSE: 15.0000 mL | Freq: Once | OROMUCOSAL | Status: AC

## 2019-12-20 MED ORDER — BUPIVACAINE LIPOSOME 1.3 % IJ SUSP
INTRAMUSCULAR | Status: DC | PRN
  Administered 2019-12-20: 10 mL

## 2019-12-20 MED ORDER — LACTATED RINGERS IV SOLN: INTRAVENOUS | Status: DC

## 2019-12-20 MED ORDER — FENTANYL CITRATE (PF) 100 MCG/2ML IJ SOLN
INTRAMUSCULAR | Status: AC
  Filled 2019-12-20: qty 2

## 2019-12-20 MED ORDER — PHENYLEPHRINE HCL-NACL 10-0.9 MG/250ML-% IV SOLN
INTRAVENOUS | Status: DC | PRN
  Administered 2019-12-20: 50 ug/min via INTRAVENOUS

## 2019-12-20 MED ORDER — HYDROMORPHONE HCL 2 MG PO TABS
2.0000 mg | ORAL_TABLET | ORAL | 0 refills | Status: DC | PRN
Start: 2019-12-20 — End: 2022-08-25

## 2019-12-20 MED ORDER — ONDANSETRON HCL 4 MG PO TABS: 4.0000 mg | ORAL_TABLET | Freq: Three times a day (TID) | ORAL | 0 refills | Status: DC | PRN

## 2019-12-20 MED ORDER — EPHEDRINE SULFATE-NACL 50-0.9 MG/10ML-% IV SOSY
PREFILLED_SYRINGE | INTRAVENOUS | Status: DC | PRN
  Administered 2019-12-20 (×2): 15 mg via INTRAVENOUS
  Administered 2019-12-20: 20 mg via INTRAVENOUS

## 2019-12-20 MED ORDER — LACTATED RINGERS IV BOLUS
500.0000 mL | Freq: Once | INTRAVENOUS | Status: AC
  Administered 2019-12-20: 500 mL via INTRAVENOUS

## 2019-12-20 MED ORDER — LACTATED RINGERS IV BOLUS: 250.0000 mL | Freq: Once | INTRAVENOUS | Status: DC

## 2019-12-20 MED ORDER — FENTANYL CITRATE (PF) 100 MCG/2ML IJ SOLN
50.0000 ug | INTRAMUSCULAR | Status: DC
  Administered 2019-12-20: 50 ug via INTRAVENOUS
  Filled 2019-12-20: qty 2

## 2019-12-20 MED ORDER — ONDANSETRON HCL 4 MG/2ML IJ SOLN
INTRAMUSCULAR | Status: AC
  Filled 2019-12-20: qty 2

## 2019-12-20 MED ORDER — PROPOFOL 10 MG/ML IV BOLUS
INTRAVENOUS | Status: DC | PRN
  Administered 2019-12-20: 150 mg via INTRAVENOUS

## 2019-12-20 MED ORDER — DEXAMETHASONE SODIUM PHOSPHATE 10 MG/ML IJ SOLN
INTRAMUSCULAR | Status: AC
  Filled 2019-12-20: qty 1

## 2019-12-20 MED ORDER — PHENYLEPHRINE 40 MCG/ML (10ML) SYRINGE FOR IV PUSH (FOR BLOOD PRESSURE SUPPORT)
PREFILLED_SYRINGE | INTRAVENOUS | Status: DC | PRN
  Administered 2019-12-20 (×2): 120 ug via INTRAVENOUS

## 2019-12-20 MED ORDER — PHENYLEPHRINE HCL-NACL 10-0.9 MG/250ML-% IV SOLN
INTRAVENOUS | Status: AC
  Filled 2019-12-20: qty 250

## 2019-12-20 MED ORDER — FENTANYL CITRATE (PF) 250 MCG/5ML IJ SOLN
INTRAMUSCULAR | Status: DC | PRN
Start: 2019-12-20 — End: 2019-12-20
  Administered 2019-12-20: 50 ug via INTRAVENOUS

## 2019-12-20 MED ORDER — CYCLOBENZAPRINE HCL 10 MG PO TABS: 10.0000 mg | ORAL_TABLET | Freq: Three times a day (TID) | ORAL | 1 refills | Status: AC | PRN

## 2019-12-20 SURGICAL SUPPLY — 69 items
ADH SKN CLS APL DERMABOND .7 (GAUZE/BANDAGES/DRESSINGS) ×1
AID PSTN UNV HD RSTRNT DISP (MISCELLANEOUS) ×1
BAG SPEC THK2 15X12 ZIP CLS (MISCELLANEOUS) ×1
BAG ZIPLOCK 12X15 (MISCELLANEOUS) ×2 IMPLANT
BIT DRILL 2.0X128 (BIT) ×2 IMPLANT
BLADE SAW SGTL 81X20 HD (BLADE) IMPLANT
BLADE SAW SGTL 83.5X18.5 (BLADE) ×2 IMPLANT
BODY TRUNION ECLIPSE 45 SL (Shoulder) ×1 IMPLANT
CEMENT BONE DEPUY (Cement) ×2 IMPLANT
COOLER ICEMAN CLASSIC (MISCELLANEOUS) ×1 IMPLANT
COVER BACK TABLE 60X90IN (DRAPES) ×2 IMPLANT
COVER SURGICAL LIGHT HANDLE (MISCELLANEOUS) ×2 IMPLANT
COVER WAND RF STERILE (DRAPES) ×1 IMPLANT
DERMABOND ADVANCED (GAUZE/BANDAGES/DRESSINGS) ×1
DERMABOND ADVANCED .7 DNX12 (GAUZE/BANDAGES/DRESSINGS) ×1 IMPLANT
DRAPE SHEET LG 3/4 BI-LAMINATE (DRAPES) ×2 IMPLANT
DRAPE SURG 17X11 SM STRL (DRAPES) ×2 IMPLANT
DRAPE U-SHAPE 47X51 STRL (DRAPES) ×2 IMPLANT
DRESSING AQUACEL AG SP 3.5X10 (GAUZE/BANDAGES/DRESSINGS) IMPLANT
DRSG AQUACEL AG ADV 3.5X10 (GAUZE/BANDAGES/DRESSINGS) ×2 IMPLANT
DRSG AQUACEL AG SP 3.5X10 (GAUZE/BANDAGES/DRESSINGS) ×2
DURAPREP 26ML APPLICATOR (WOUND CARE) ×4 IMPLANT
ELECT BLADE TIP CTD 4 INCH (ELECTRODE) ×2 IMPLANT
ELECT REM PT RETURN 15FT ADLT (MISCELLANEOUS) ×2 IMPLANT
FACESHIELD WRAPAROUND (MASK) ×8 IMPLANT
FACESHIELD WRAPAROUND OR TEAM (MASK) ×4 IMPLANT
GLENOID WITH CLEAT MEDIUM (Shoulder) ×1 IMPLANT
GLOVE BIO SURGEON STRL SZ7.5 (GLOVE) ×2 IMPLANT
GLOVE BIO SURGEON STRL SZ8 (GLOVE) ×2 IMPLANT
GLOVE SS BIOGEL STRL SZ 7 (GLOVE) ×1 IMPLANT
GLOVE SS BIOGEL STRL SZ 7.5 (GLOVE) ×1 IMPLANT
GLOVE SUPERSENSE BIOGEL SZ 7 (GLOVE) ×1
GLOVE SUPERSENSE BIOGEL SZ 7.5 (GLOVE) ×1
GOWN STRL REUS W/TWL LRG LVL3 (GOWN DISPOSABLE) ×4 IMPLANT
HEAD HUM ECLIPSE 45/19 (Shoulder) ×1 IMPLANT
IMPL ECLIPSE SPEEDCAP (Shoulder) IMPLANT
IMPLANT ECLIPSE SPEEDCAP (Shoulder) ×2 IMPLANT
KIT BASIN OR (CUSTOM PROCEDURE TRAY) ×2 IMPLANT
KIT TURNOVER KIT A (KITS) IMPLANT
MANIFOLD NEPTUNE II (INSTRUMENTS) ×2 IMPLANT
MARKER SKIN DUAL TIP RULER LAB (MISCELLANEOUS) ×2 IMPLANT
NDL TAPERED W/ NITINOL LOOP (MISCELLANEOUS) IMPLANT
NEEDLE TAPERED W/ NITINOL LOOP (MISCELLANEOUS) IMPLANT
NS IRRIG 1000ML POUR BTL (IV SOLUTION) ×2 IMPLANT
PACK SHOULDER (CUSTOM PROCEDURE TRAY) ×2 IMPLANT
PAD COLD SHLDR WRAP-ON (PAD) ×1 IMPLANT
PIN NITINOL TARGETER 2.8 (PIN) IMPLANT
PIN SET MODULAR GLENOID SYSTEM (PIN) ×1 IMPLANT
PROTECTOR NERVE ULNAR (MISCELLANEOUS) ×2 IMPLANT
RESTRAINT HEAD UNIVERSAL NS (MISCELLANEOUS) ×2 IMPLANT
SCREW SM FOR ECLIPSE CAGE 30 (Screw) ×1 IMPLANT
SIZER ECLIPSE CAGE SCREW (ORTHOPEDIC DISPOSABLE SUPPLIES) ×1 IMPLANT
SLING ARM FOAM STRAP LRG (SOFTGOODS) ×1 IMPLANT
SLING ARM FOAM STRAP MED (SOFTGOODS) IMPLANT
SMARTMIX MINI TOWER (MISCELLANEOUS) ×2
SPONGE LAP 18X18 RF (DISPOSABLE) IMPLANT
SUCTION FRAZIER HANDLE 12FR (TUBING) ×2
SUCTION TUBE FRAZIER 12FR DISP (TUBING) ×1 IMPLANT
SUT FIBERWIRE #2 38 T-5 BLUE (SUTURE)
SUT MNCRL AB 3-0 PS2 18 (SUTURE) ×2 IMPLANT
SUT MON AB 2-0 CT1 36 (SUTURE) ×2 IMPLANT
SUT VIC AB 1 CT1 36 (SUTURE) ×6 IMPLANT
SUTURE FIBERWR #2 38 T-5 BLUE (SUTURE) IMPLANT
SUTURE TAPE 1.3 40 TPR END (SUTURE) IMPLANT
SUTURETAPE 1.3 40 TPR END (SUTURE)
TOWEL OR 17X26 10 PK STRL BLUE (TOWEL DISPOSABLE) ×2 IMPLANT
TOWEL OR NON WOVEN STRL DISP B (DISPOSABLE) ×2 IMPLANT
TOWER SMARTMIX MINI (MISCELLANEOUS) IMPLANT
WATER STERILE IRR 1000ML POUR (IV SOLUTION) ×4 IMPLANT

## 2019-12-20 NOTE — Transfer of Care (Signed)
Immediate Anesthesia Transfer of Care Note  Patient: Dustin Carter  Procedure(s) Performed: TOTAL SHOULDER ARTHROPLASTY (Right Shoulder)  Patient Location: PACU  Anesthesia Type:General  Level of Consciousness: sedated  Airway & Oxygen Therapy: Patient Spontanous Breathing and Patient connected to face mask oxygen  Post-op Assessment: Report given to RN and Post -op Vital signs reviewed and stable  Post vital signs: Reviewed and stable  Last Vitals:  Vitals Value Taken Time  BP 119/73 12/20/19 1216  Temp    Pulse 70 12/20/19 1217  Resp 16 12/20/19 1217  SpO2 100 % 12/20/19 1217  Vitals shown include unvalidated device data.  Last Pain:  Vitals:   12/20/19 0815  TempSrc:   PainSc: 3       Patients Stated Pain Goal: 2 (12/20/19 0815)  Complications: No complications documented.

## 2019-12-20 NOTE — Anesthesia Procedure Notes (Signed)
Procedure Name: Intubation Date/Time: 12/20/2019 10:21 AM Performed by: Talbot Grumbling, CRNA Pre-anesthesia Checklist: Patient identified, Emergency Drugs available, Suction available and Patient being monitored Patient Re-evaluated:Patient Re-evaluated prior to induction Oxygen Delivery Method: Circle system utilized Preoxygenation: Pre-oxygenation with 100% oxygen Induction Type: IV induction Ventilation: Mask ventilation without difficulty Laryngoscope Size: Mac and 3 Grade View: Grade I Tube type: Oral Tube size: 7.5 mm Number of attempts: 1 Airway Equipment and Method: Stylet Placement Confirmation: ETT inserted through vocal cords under direct vision,  positive ETCO2 and breath sounds checked- equal and bilateral Secured at: 22 cm Tube secured with: Tape Dental Injury: Teeth and Oropharynx as per pre-operative assessment

## 2019-12-20 NOTE — Op Note (Signed)
12/20/2019  12:02 PM  PATIENT:   Dustin Carter  64 y.o. male  PRE-OPERATIVE DIAGNOSIS:  right shoulder osteoarthritis  POST-OPERATIVE DIAGNOSIS: Same  PROCEDURE: Right shoulder stemless anatomic arthroplasty utilizing a 45 x 19 humeral head with a 45 trunnion and a small cage screw with a medium glenoid  SURGEON:  Abyan Cadman, Vania Rea M.D.  ASSISTANTS: Ralene Bathe, PA-C  ANESTHESIA:   General endotracheal and interscalene block with Exparel  EBL: 150 cc  SPECIMEN: None  Drains: None   PATIENT DISPOSITION:  PACU - hemodynamically stable.    PLAN OF CARE: Discharge to home after PACU  Brief history:  Dustin Carter is a 64 year old gentleman well-known to our practice after a previous left shoulder anatomic arthroplasty who was being followed now for chronic and progressively increasing right shoulder pain related to advanced osteoarthritis.  Due to his increasing functional rotations and failure to respond to conservative management he is brought to the operating this time for planned right anatomic arthroplasty.  Preoperatively, I counseled the patient regarding treatment options and risks versus benefits thereof.  Possible surgical complications were all reviewed including potential for bleeding, infection, neurovascular injury, persistent pain, loss of motion, anesthetic complication, failure of the implant, and possible need for additional surgery. They understand and accept and agrees with our planned procedure.   Procedure in detail:  After undergoing routine preop evaluation the patient received prophylactic antibiotics and interscalene block with Exparel was established in the holding area by the anesthesia department.  Patient subsequently placed supine on the operating table and underwent the smooth induction of a general endotracheal anesthesia.  Placed into the beachchair position and appropriately padded and protected.  The right shoulder girdle region was sterilely  prepped and draped in standard fashion.  Timeout was called.  An anterior deltopectoral approach was made to the right shoulder through a approximately 10 cm incision.  The deltopectoral interval was then developed from proximal to distal with the vein taken laterally in the upper centimeter of the pectoralis major tendon was released for exposure.  The conjoined tendon was mobilized and retracted medially.  The long head bicep tendon was tenodesed at the upper border the pectoralis major tendon and the proximal segment was unroofed and excised.  The rotator cuff was then split along the rotator interval to the base of the coracoid and we then outlined the insertion of the subscapularis at the superior and inferior extent of the lesser tuberosity.  An oscillating saw was then used to perform a lesser tuberosity osteotomy and the subscap was intact and reflected medially.  Capsular attachments on the anterior and inferior margins of the humeral neck were then elevated in a subperiosteal fashion allowing deliver the humeral head through the wound.  We used an extra medullary guide to outline our proposed humeral head resection which was performed at the native retroversion of approximately 30 degrees.  Remaining osteophytes on the margin of the humeral neck were removed with rondure.  We then sized the humeral metaphysis at approximate 45 and we outlined the positioning for our ultimate humeral head implant using the appropriate guide and selected a small cage screw length based on the anatomy.  At this point then a metal cap was placed over the cut proximal humeral surface and we returned our attention to the glenoid which was exposed using appropriate retractors and a circumferential labral resection was performed gaining complete visualization the periphery of the glenoid.  This was sized at a medium with a guidepin  directed into the glenoid and the glenoid was then reamed to a stable subchondral bony bed.  Glenoid  preparation was completed by creating our central drill hole in the superior and inferior peg and slot respectively.  Glenoid was then broached and a trial showed excellent fit.  At this point the glenoid was meticulously cleaned and dried.  Cement was introduced into the superior and inferior peg and slot respectively.  Morselized bone placed around the central peg in the glenoid was then impacted with excellent fit and fixation.  We then returned our attention back to the proximal humerus where the 45 trunnion was then impacted into the previously selected position.  The cage screw was filled with bone graft from the resected humeral head and the cage screw was then introduced with excellent purchase and fixation.  We then performed trial reductions and ultimately felt that the 45 x 19 head gave [place] the best soft tissue balance with approximate 50% translation and good elasticity of the tissues.  This point the trial was then removed.  We placed our 3 medial row anchors for the repair of the subscapularis and once this was completed we went ahead and cleaned the Morse taper and our final 45 x 19 head was impacted.  Final reduction was then performed and very pleased with her overall soft tissue balance motion and stability.  We then repaired the subscapularis utilizing our standard double row technique passing the suture limbs of the bone tendon junction medially and then traversing the suture limbs into 2 lateral anchors which allowed excellent reapposition of the lesser tuberosity osteotomy.  We also repaired the rotator interval with a pair of figure-of-eight suture tape sutures.  Upon completion we are very pleased with the overall construct and the arm easily achieved 30 degrees of external rotation with the arm at the side did confirm good elasticity of the subscapularis prior to the repair.  This point the wound was again copes irrigated.  Final hemostasis was obtained.  The deltopectoral interval was  reapproximated with a series of figure-of-eight number Vicryl sutures.  2-0 Vicryl used for subcu layer and intracuticular 3-0 Monocryl for the skin followed by Dermabond and Aquacel dressing.  Arm is in place into a sling.  Patient was awakened, extubated, and taken to the recovery room in stable addition.  Ralene Bathe, PA-C was utilized as an Geophysicist/field seismologist throughout this case, essential for help with positioning the patient, positioning extremity, tissue manipulation, implantation of the prosthesis, suture management, wound closure, and intraoperative decision-making.  Senaida Lange MD   Contact # [phone]

## 2019-12-20 NOTE — H&P (Signed)
Dustin Carter    Chief Complaint: right shoulder osteoarthritis HPI: The patient is a 64 y.o. male with chronic and progressively increasing right shoulder pain related to severe glenohumeral joint osteoarthritis.  Due to his increasing functional imitations and failure to respond to prolonged attempts at conservative management he is brought to the operating this time for planned right anatomic total shoulder arthroplasty  Past Medical History:  Diagnosis Date  . Arthritis    SPINE AND "HEAD TO TOES"  . Atypical chest pain at rest   cardiologist-- dr berry-- work up done per note all normal (stress test and cardiac cath)  . BPH (benign prostatic hypertrophy) Mar 29, 2011   TREATED IN DR. TANNENBAUM'S OFFICE WITH "HEAT" THERAPY TO SHRINK THE PROSTATE  . DOE (dyspnea on exertion)   . Family history of adverse reaction to anesthesia    MOTHER--- HARD TO WAKE  . Full dentures   . GERD (gastroesophageal reflux disease)   . History of kidney stones   . Hyperlipidemia   . Hypertension   . Polyneuropathy    DUE TO POST SURGERY'S  . PONV (postoperative nausea and vomiting)    hx of with first back surgery   . Sinus tachycardia cardiologist- dr berry   hx palpitations w/ holter event monitor showed SR, SB, runs of PSVT-- pt takes beta blocker  . Wears glasses     Past Surgical History:  Procedure Laterality Date  . ANTERIOR CERVICAL DECOMP/DISCECTOMY FUSION  03/09/2004;  04-29-2008   C5-6 (03-09-2004)  C3-4 (04-29-2008  . BIOPSY  01/28/2019   Procedure: BIOPSY;  Surgeon: Graylin Shiver, MD;  Location: [facility];  Service: Endoscopy;;  . CARDIOVASCULAR STRESS TEST  08-30-2016  dr berry   Low risk nuclear study w/ normal perfusion and mild reduced LV global systolic function (study was gated , no percentage on results) recommend echo  . CERVICAL FUSION  11/01/2005   C6-7 diskecotmy, decompression and fusion/  removal hardware C5-6  . CHOLECYSTECTOMY  12/29/2011   Procedure: LAPAROSCOPIC  CHOLECYSTECTOMY WITH INTRAOPERATIVE CHOLANGIOGRAM;  Surgeon: Atilano Ina, MD,FACS;  Location: [facility];  Service: General;  Laterality: N/A;  . CYSTOSCOPY W/ URETERAL STENT PLACEMENT Left 12-05-1999;  01-02-2000  . CYSTOSCOPY/RETROGRADE/URETEROSCOPY/STONE EXTRACTION WITH BASKET Left 04/15/2014   Procedure: CYSTOSCOPY,  LEFT RETROGRADE PYELOGRAM,  LEFT URETEROSCOPY,  LEFT STONE EXTRACTION WITH BASKET, LEFT STENT PLACEMENT ;  Surgeon: Kathi Ludwig, MD;  Location: [facility];  Service: Urology;  Laterality: Left;  . CYSTOSCOPY/RETROGRADE/URETEROSCOPY/STONE EXTRACTION WITH BASKET Bilateral left 12-14-1999;  right 11-10-2007  dr Patsi Sears  . CYSTOSCOPY/URETEROSCOPY/HOLMIUM LASER/STENT PLACEMENT Bilateral 12/29/2016   Procedure: CYSTOSCOPY RIGHT URETEROSCOPY POSSIBLE LEFT  WITH HOLMIUM LASER AND URETERAL STENT PLACEMENT;  Surgeon: Crista Elliot, MD;  Location: [facility];  Service: Urology;  Laterality: Bilateral;  . ESOPHAGOGASTRODUODENOSCOPY (EGD) WITH PROPOFOL N/A 01/28/2019   Procedure: ESOPHAGOGASTRODUODENOSCOPY (EGD) WITH PROPOFOL;  Surgeon: Graylin Shiver, MD;  Location: [facility] ENDOSCOPY;  Service: Endoscopy;  Laterality: N/A;  . HOLMIUM LASER APPLICATION Left 04/15/2014   Procedure: HOLMIUM LASER APPLICATION;  Surgeon: Kathi Ludwig, MD;  Location: [facility];  Service: Urology;  Laterality: Left;  . IRRIGATION AND DEBRIDEMENT ABSCESS  07/27/2009   left index finger  . LEFT HEART CATHETERIZATION WITH CORONARY ANGIOGRAM N/A 11/02/2011   Procedure: LEFT HEART CATHETERIZATION WITH CORONARY ANGIOGRAM;  Surgeon: Runell Gess, MD;  Location: [facility] CATH LAB;  Service: Cardiovascular;  Laterality: N/A;  normal coronary arteries and lvf  . LUMBAR  DISC SURGERY  10-15-2008  dr hirsch   redo laminectomy L4-5 w/ fusion/  revision L3-5 fusion  . LUMBAR FUSION  06-29-2001  dr hirsch   L3-4 diskectomy/ L1-2 lam. & disk./  fusion L1-4/  open reduction and internal fixation spondylolisthesis  L2-3  . LUMBAR LAMINECTOMY/DECOMPRESSION MICRODISCECTOMY  02/29/2012   Procedure: LUMBAR LAMINECTOMY/DECOMPRESSION MICRODISCECTOMY 1 LEVEL;  Surgeon: Clydene Fake, MD;  Location: [facility]URO [facility];  Service: Neurosurgery;  Laterality: Right;  Right Lumbar five-sacral oneDiskectomy  . LUMBAR SPINE SURGERY  1997  . POSTERIOR FUSION CERVICAL SPINE  06/06/2006   C6-7 and C7-T1  . SPINE SURGERY  05-24-2000  dr hirsch   L3-4 laminectomy/ diskectomy;  T9-10 diskectomy/ fusion  . TONSILLECTOMY AND ADENOIDECTOMY  age 65  . TOTAL SHOULDER ARTHROPLASTY Left 01-08-2010  dr Darrall Strey [facility]  . TRANSTHORACIC ECHOCARDIOGRAM  09-10-2016   dr berry   ef 60-65%/  mild AV calcification without stenosis, no regurg./  mild MR and TR/ mild LAE    Family History  Problem Relation Age of Onset  . Heart disease Father   . Hyperlipidemia Father   . Heart disease Maternal Grandmother   . Stroke Paternal Grandfather   . Heart disease Brother   . Hyperlipidemia Brother   . Cancer Maternal Aunt        brain  . Heart disease Maternal Uncle   . Cancer Paternal Uncle        liver  . Heart disease Brother   . Hyperlipidemia Brother   . Heart disease Maternal Uncle     Social History:  reports that he quit smoking about 27 years ago. His smoking use included cigarettes. He has a 12.00 pack-year smoking history. He quit smokeless tobacco use about 39 years ago.  His smokeless tobacco use included chew. He reports previous alcohol use. He reports that he does not use drugs.   Medications Prior to Admission  Medication Sig Dispense Refill  . amitriptyline (ELAVIL) 150 MG tablet Take 150 mg by mouth at bedtime.    Marland Kitchen atorvastatin (LIPITOR) 40 MG tablet Take 40 mg by mouth at bedtime.    . cyclobenzaprine (FLEXERIL) 10 MG tablet Take 10 mg by mouth at bedtime as needed for muscle spasms.     Marland Kitchen gabapentin (NEURONTIN) 300 MG capsule Take 300-600 mg by mouth See admin instructions. Take 300 mg by mouth in the morning and  afternoon and 600 mg at night    . metoprolol succinate (TOPROL-XL) 100 MG 24 hr tablet TAKE 1 TABLET BY MOUTH EVERY MORNING WITH OR IMMEDIATELY FOLLOWING A MEAL (Patient taking differently: Take 100 mg by mouth daily. ) 30 tablet 7  . oxyCODONE-acetaminophen (PERCOCET) 7.5-325 MG tablet Take 1 tablet by mouth every 6 (six) hours as needed for moderate pain.     . pantoprazole (PROTONIX) 40 MG tablet Take 1 tablet (40 mg total) by mouth 2 (two) times daily before a meal. (Patient taking differently: Take 40 mg by mouth daily. ) 60 tablet 0  . ferrous sulfate 220 (44 Fe) MG/5ML solution Take 440 mg by mouth every morning. (Patient not taking: Reported on 12/04/2019)    . meloxicam (MOBIC) 15 MG tablet Take 15 mg by mouth daily. (Patient not taking: Reported on 12/04/2019)    . oxyCODONE-acetaminophen (PERCOCET) 7.5-325 MG per tablet Take 1 tablet by mouth every 4 (four) hours as needed for pain. (Patient not taking: Reported on 12/04/2019)       Physical Exam: .  Right shoulder  demonstrates painful and guarded motion as noted at his recent office visits.  Plain radiographs confirm complete obliteration of the joint space with subchondral sclerosis and peripheral osteophyte formation.  Vitals  Temp:  [98 F (36.7 C)] 98 F (36.7 C) (08/26 0808) Pulse Rate:  [61-68] 61 (08/26 0908) Resp:  [12-15] 14 (08/26 0908) BP: (133-143)/(79-86) 133/79 (08/26 0903) SpO2:  [95 %-99 %] 99 % (08/26 0908) Weight:  [81.4 kg] 81.4 kg (08/26 0815)  Assessment/Plan  Impression: right shoulder osteoarthritis  Plan of Action: Procedure(s): TOTAL SHOULDER ARTHROPLASTY  Dustin Carter 12/20/2019, 9:27 AM Contact # [phone]

## 2019-12-20 NOTE — Discharge Instructions (Signed)
 Kevin M. Supple, M.D., F.A.A.O.S. Orthopaedic Surgery Specializing in Arthroscopic and Reconstructive Surgery of the Shoulder [phone] [address] - [place], [place] [address] - Fax [phone]   POST-OP TOTAL SHOULDER REPLACEMENT INSTRUCTIONS  1. Follow up in the office for your first post-op appointment 10-14 days from the date of your surgery. If you do not already have a scheduled appointment, our office will contact you to schedule.  2. The bandage over your incision is waterproof. You may begin showering with this dressing on. You may leave this dressing on until first follow up appointment within 2 weeks. We prefer you leave this dressing in place until follow up however after 5-7 days if you are having itching or skin irritation and would like to remove it you may do so. Go slow and tug at the borders gently to break the bond the dressing has with the skin. At this point if there is no drainage it is okay to go without a bandage or you may cover it with a light guaze and tape. You can also expect significant bruising around your shoulder that will drift down your arm and into your chest wall. This is very normal and should resolve over several days.   3. Wear your sling/immobilizer at all times except to perform the exercises below or to occasionally let your arm dangle by your side to stretch your elbow. You also need to sleep in your sling immobilizer until instructed otherwise. It is ok to remove your sling if you are sitting in a controlled environment and allow your arm to rest in a position of comfort by your side or on your lap with pillows to give your neck and skin a break from the sling. You may remove it to allow arm to dangle by side to shower. If you are up walking around and when you go to sleep at night you need to wear it.  4. Range of motion to your elbow, wrist, and hand are encouraged 3-5 times daily. Exercise to your hand and fingers helps to reduce  swelling you may experience.   5. Prescriptions for a pain medication and a muscle relaxant are provided for you. It is recommended that if you are experiencing pain that you pain medication alone is not controlling, add the muscle relaxant along with the pain medication which can give additional pain relief. The first 1-2 days is generally the most severe of your pain and then should gradually decrease. As your pain lessens it is recommended that you decrease your use of the pain medications to an "as needed basis'" only and to always comply with the recommended dosages of the pain medications.  6. Pain medications can produce constipation along with their use. If you experience this, the use of an over the counter stool softener or laxative daily is recommended.   7. For additional questions or concerns, please do not hesitate to call the office. If after hours there is an answering service to forward your concerns to the physician on call.  8.Pain control following an exparel block  To help control your post-operative pain you received a nerve block  performed with Exparel which is a long acting anesthetic (numbing agent) which can provide pain relief and sensations of numbness (and relief of pain) in the operative shoulder and arm for up to 3 days. Sometimes it provides mixed relief, meaning you may still have numbness in certain areas of the arm but can still be able to   move  parts of that arm, hand, and fingers. We recommend that your prescribed pain medications  be used as needed. We do not feel it is necessary to "pre medicate" and "stay ahead" of pain.  Taking narcotic pain medications when you are not having any pain can lead to unnecessary and potentially dangerous side effects.    9. Use the ice machine as much as possible in the first 5-7 days from surgery, then you can wean its use to as needed. The ice typically needs to be replaced every 6 hours, instead of ice you can actually freeze  water bottles to put in the cooler and then fill water around them to avoid having to purchase ice. You can have spare water bottles freezing to allow you to rotate them once they have melted. Try to have a thin shirt or light cloth or towel under the ice wrap to protect your skin.   10.  We recommend that you avoid any dental work or cleaning in the first 3 months following your joint replacement. This is to help minimize the possibility of infection from the bacteria in your mouth that enters your bloodstream during dental work. We also recommend that you take an antibiotic prior to your dental work for the first year after your shoulder replacement to further help reduce that risk. Please simply contact our office for antibiotics to be sent to your pharmacy prior to dental work.  11. Dental Antibiotics:  In most cases prophylactic antibiotics for Dental procdeures after total joint surgery are not necessary.  Exceptions are as follows:  1. History of prior total joint infection  2. Severely immunocompromised (Organ Transplant, cancer chemotherapy, Rheumatoid biologic meds such as Humera)  3. Poorly controlled diabetes (A1C &gt; 8.0, blood glucose over 200)  If you have one of these conditions, contact your surgeon for an antibiotic prescription, prior to your dental procedure.   POST-OP EXERCISES  Pendulum Exercises  Perform pendulum exercises while standing and bending at the waist. Support your uninvolved arm on a table or chair and allow your operated arm to hang freely. Make sure to do these exercises passively - not using you shoulder muscles. These exercises can be performed once your nerve block effects have worn off.  Repeat 20 times. Do 3 sessions per day.      General Anesthesia, Adult, Care After This sheet gives you information about how to care for yourself after your procedure. Your health care provider may also give you more specific instructions. If you have  problems or questions, contact your health care provider. What can I expect after the procedure? After the procedure, the following side effects are common:  Pain or discomfort at the IV site.  Nausea.  Vomiting.  Sore throat.  Trouble concentrating.  Feeling cold or chills.  Weak or tired.  Sleepiness and fatigue.  Soreness and body aches. These side effects can affect parts of the body that were not involved in surgery. Follow these instructions at home:  For at least 24 hours after the procedure:  Have a responsible adult stay with you. It is important to have someone help care for you until you are awake and alert.  Rest as needed.  Do not: ? Participate in activities in which you could fall or become injured. ? Drive. ? Use heavy machinery. ? Drink alcohol. ? Take sleeping pills or medicines that cause drowsiness. ? Make important decisions or sign legal documents. ? Take care of children on your   own. Eating and drinking  Follow any instructions from your health care provider about eating or drinking restrictions.  When you feel hungry, start by eating small amounts of foods that are soft and easy to digest (bland), such as toast. Gradually return to your regular diet.  Drink enough fluid to keep your urine pale yellow.  If you vomit, rehydrate by drinking water, juice, or clear broth. General instructions  If you have sleep apnea, surgery and certain medicines can increase your risk for breathing problems. Follow instructions from your health care provider about wearing your sleep device: ? Anytime you are sleeping, including during daytime naps. ? While taking prescription pain medicines, sleeping medicines, or medicines that make you drowsy.  Return to your normal activities as told by your health care provider. Ask your health care provider what activities are safe for you.  Take over-the-counter and prescription medicines only as told by your health  care provider.  If you smoke, do not smoke without supervision.  Keep all follow-up visits as told by your health care provider. This is important. Contact a health care provider if:  You have nausea or vomiting that does not get better with medicine.  You cannot eat or drink without vomiting.  You have pain that does not get better with medicine.  You are unable to pass urine.  You develop a skin rash.  You have a fever.  You have redness around your IV site that gets worse. Get help right away if:  You have difficulty breathing.  You have chest pain.  You have blood in your urine or stool, or you vomit blood. Summary  After the procedure, it is common to have a sore throat or nausea. It is also common to feel tired.  Have a responsible adult stay with you for the first 24 hours after general anesthesia. It is important to have someone help care for you until you are awake and alert.  When you feel hungry, start by eating small amounts of foods that are soft and easy to digest (bland), such as toast. Gradually return to your regular diet.  Drink enough fluid to keep your urine pale yellow.  Return to your normal activities as told by your health care provider. Ask your health care provider what activities are safe for you. This information is not intended to replace advice given to you by your health care provider. Make sure you discuss any questions you have with your health care provider. Document Revised: 04/15/2017 Document Reviewed: 11/26/2016 Elsevier Patient Education  2020 [employer]   

## 2019-12-20 NOTE — Anesthesia Preprocedure Evaluation (Addendum)
Anesthesia Evaluation  Patient identified by MRN, date of birth, ID band Patient awake    Reviewed: Allergy & Precautions, NPO status , Patient's Chart, lab work & pertinent test results  History of Anesthesia Complications (+) PONVNegative for: history of anesthetic complications  Airway Mallampati: I  TM Distance: >3 FB Neck ROM: Full    Dental  (+) Edentulous Upper, Edentulous Lower   Pulmonary former smoker,    Pulmonary exam normal        Cardiovascular hypertension, Pt. on medications and Pt. on home beta blockers (-) anginaNormal cardiovascular exam+ dysrhythmias Supra Ventricular Tachycardia    '18 TTE - EF 60% to 65%. Mild MR. Mildly dilated LA    Neuro/Psych  S/p ACDF   Neuromuscular disease (polyneuropathy) negative psych ROS   GI/Hepatic Neg liver ROS, GERD  Medicated and Controlled,  Endo/Other  negative endocrine ROS  Renal/GU  Renal stones      Musculoskeletal  (+) Arthritis ,   Abdominal Normal abdominal exam  (+)   Peds  Hematology  (+) anemia ,   Anesthesia Other Findings   Reproductive/Obstetrics                            Anesthesia Physical  Anesthesia Plan  ASA: III  Anesthesia Plan: General and Regional   Post-op Pain Management: GA combined w/ Regional for post-op pain   Induction: Intravenous  PONV Risk Score and Plan: 3 and Treatment may vary due to age or medical condition, Ondansetron, Dexamethasone and Midazolam  Airway Management Planned: Oral ETT  Additional Equipment: None  Intra-op Plan:   Post-operative Plan:   Informed Consent: I have reviewed the patients History and Physical, chart, labs and discussed the procedure including the risks, benefits and alternatives for the proposed anesthesia with the patient or authorized representative who has indicated his/her understanding and acceptance.       Plan Discussed with: CRNA and  Anesthesiologist  Anesthesia Plan Comments:        Anesthesia Quick Evaluation

## 2019-12-20 NOTE — Evaluation (Signed)
Occupational Therapy Evaluation Patient Details Name: Dustin Carter MRN: [id number] DOB: 1955/09/05 Today's Date: 12/20/2019    History of Present Illness Patient s/p R TSA   Clinical Impression   [name] is a 64 year old man s/p shoulder replacement without functional use of right dominant upper extremity secondary to effects of surgery, interscalene block and shoulder precautions. Therapist provided education and instruction to patient and caregiver in regards to exercises, precautions, positioning, donning upper extremity clothing and bathing while maintaining shoulder precautions, ice and edema management and donning/doffing sling. Patient and caregiver verbalized understanding and handouts provided to maximize retention of education. Patient needed assistance to donn shirt, underwear, shorts and shoes and provided with instruction on compensatory strategies to perform ADLs. Patient to follow up with MD for further therapy needs.      Follow Up Recommendations  Follow surgeons recommendation for DC plan and follow-up therapies    Equipment Recommendations       Recommendations for Other Services       Precautions / Restrictions Precautions Precautions: Shoulder Type of Shoulder Precautions: May come out of sling if sitting in controlled environment. ie while watching tv, eating etc to give neck and skin break from sling. Please sleep in sling though until 4 weeks post op. Ok to use operative arm to assist in feeding, bathing, ADL's. New PROM restrictions (8/18) for use in hygiene and ADL only ER 20 ABD 45 FE 60 Pendulums are to be gentle and are the preferred exercise to be instructed for patients to perform at home.( Along with elbow wrist and hand exercise) Shoulder Interventions: Shoulder sling/immobilizer;At all times;Off for dressing/bathing/exercises (okay to come out of sling in controlled environment.) Required Braces or Orthoses: Sling Restrictions Weight Bearing  Restrictions: Yes RUE Weight Bearing: Non weight bearing      Mobility Bed Mobility                  Transfers Overall transfer level: Modified independent               General transfer comment: min guard for sit to stand for safety s/p surgery    Balance Overall balance assessment: No apparent balance deficits (not formally assessed)                                         ADL either performed or assessed with clinical judgement   ADL Overall ADL's : Needs assistance/impaired Eating/Feeding: Set up   Grooming: Modified independent   Upper Body Bathing: Minimal assistance   Lower Body Bathing: Modified independent   Upper Body Dressing : Moderate assistance;Adhering to UE precautions   Lower Body Dressing: Minimal assistance;Sit to/from stand   Toilet Transfer: Min guard   Toileting- Architect and Hygiene: Minimal assistance       Functional mobility during ADLs: Min guard       Vision Baseline Vision/History: Wears glasses Wears Glasses: At all times       Perception     Praxis      Pertinent Vitals/Pain Pain Assessment: No/denies pain     Hand Dominance Right   Extremity/Trunk Assessment Upper Extremity Assessment Upper Extremity Assessment: RUE deficits/detail RUE Deficits / Details: Able to minimally wiggle his fingers.           Communication Communication Communication: No difficulties   Cognition Arousal/Alertness: Awake/alert Behavior During Therapy: WFL for tasks  assessed/performed Overall Cognitive Status: Within Functional Limits for tasks assessed                                     General Comments       Exercises     Shoulder Instructions Shoulder Instructions Donning/doffing shirt without moving shoulder: Moderate assistance;Patient able to independently direct caregiver Method for sponge bathing under operated UE: Minimal assistance;Patient able to independently  direct caregiver Donning/doffing sling/immobilizer: Patient able to independently direct caregiver;Maximal assistance Correct positioning of sling/immobilizer: Patient able to independently direct caregiver Pendulum exercises (written home exercise program): Patient able to independently direct caregiver ROM for elbow, wrist and digits of operated UE: Patient able to independently direct caregiver;Caregiver independent with task Sling wearing schedule (on at all times/off for ADL's): Patient able to independently direct caregiver;Caregiver independent with task Proper positioning of operated UE when showering: Patient able to independently direct caregiver;Caregiver independent with task Dressing change: Patient able to independently direct caregiver;Caregiver independent with task Positioning of UE while sleeping: Patient able to independently direct caregiver;Caregiver independent with task    Home Living Family/patient expects to be discharged to:: Private residence Living Arrangements: Alone Available Help at Discharge: Family;Available PRN/intermittently                             Additional Comments: Has assistance of niece.      Prior Functioning/Environment Level of Independence: Independent                 OT Problem List: Decreased strength;Decreased range of motion;Impaired UE functional use;Decreased knowledge of precautions      OT Treatment/Interventions:      OT Goals(Current goals can be found in the care plan section) Acute Rehab OT Goals Patient Stated Goal: To go home OT Goal Formulation: All assessment and education complete, DC therapy  OT Frequency:     Barriers to D/C:            Co-evaluation              AM-PAC OT "6 Clicks" Daily Activity     Outcome Measure Help from another person eating meals?: A Little Help from another person taking care of personal grooming?: None Help from another person toileting, which includes  using toliet, bedpan, or urinal?: A Little Help from another person bathing (including washing, rinsing, drying)?: A Little Help from another person to put on and taking off regular upper body clothing?: A Lot Help from another person to put on and taking off regular lower body clothing?: A Little 6 Click Score: 18   End of Session Nurse Communication:  (ot education complete)  Activity Tolerance: Patient tolerated treatment well Patient left: in chair  OT Visit Diagnosis: Muscle weakness (generalized) (M62.81)                Time: 2993-7169 OT Time Calculation (min): 30 min Charges:  OT General Charges $OT Visit: 1 Visit OT Evaluation $OT Eval Low Complexity: 1 Low OT Treatments $Self Care/Home Management : 8-22 mins  Cheryl Chay, OTR/L Acute Care Rehab Services  Office [phone] Pager: [phone]   Kelli Churn 12/20/2019, 4:00 PM

## 2019-12-20 NOTE — Progress Notes (Signed)
AssistedDr. Bass with right, ultrasound guided, interscalene  block. Side rails up, monitors on throughout procedure. See vital signs in flow sheet. Tolerated Procedure well.  

## 2019-12-20 NOTE — Anesthesia Procedure Notes (Signed)
Anesthesia Regional Block: Interscalene brachial plexus block   Pre-Anesthetic Checklist: ,, timeout performed, Correct Patient, Correct Site, Correct Laterality, Correct Procedure, Correct Position, site marked, Risks and benefits discussed,  Surgical consent,  Pre-op evaluation,  At surgeon's request and post-op pain management  Laterality: Right  Prep: chloraprep       Needles:  Injection technique: Single-shot  Needle Type: Stimiplex     Needle Length: 9cm  Needle Gauge: 21     Additional Needles:   Procedures:,,,, ultrasound used (permanent image in chart),,,,  Narrative:  Start time: 12/20/2019 8:35 AM End time: 12/20/2019 8:40 AM Injection made incrementally with aspirations every 5 mL.  Performed by: Personally  Anesthesiologist: Mellody Dance, MD

## 2019-12-21 ENCOUNTER — Encounter (HOSPITAL_COMMUNITY): Payer: Self-pay | Admitting: Orthopedic Surgery

## 2019-12-21 NOTE — Anesthesia Postprocedure Evaluation (Signed)
Anesthesia Post Note  Patient: Dustin Carter  Procedure(s) Performed: TOTAL SHOULDER ARTHROPLASTY (Right Shoulder)     Patient location during evaluation: Phase II Anesthesia Type: Regional and General Level of consciousness: awake and alert Pain management: pain level controlled Vital Signs Assessment: post-procedure vital signs reviewed and stable Respiratory status: spontaneous breathing, nonlabored ventilation and respiratory function stable Cardiovascular status: blood pressure returned to baseline and stable Postop Assessment: no apparent nausea or vomiting Anesthetic complications: no   No complications documented.  Last Vitals:  Vitals:   12/20/19 1439 12/20/19 1536  BP: (!) 153/82 (!) 144/85  Pulse: 65 70  Resp: 16 16  Temp: 36.4 C 36.4 C  SpO2: 92% 96%    Last Pain:  Vitals:   12/20/19 1536  TempSrc:   PainSc: 0-No pain                 Mellody Dance

## 2019-12-28 DIAGNOSIS — Z96611 Presence of right artificial shoulder joint: Secondary | ICD-10-CM | POA: Diagnosis not present

## 2019-12-28 DIAGNOSIS — Z471 Aftercare following joint replacement surgery: Secondary | ICD-10-CM | POA: Diagnosis not present

## 2020-01-08 DIAGNOSIS — M25511 Pain in right shoulder: Secondary | ICD-10-CM | POA: Diagnosis not present

## 2020-01-15 DIAGNOSIS — M25511 Pain in right shoulder: Secondary | ICD-10-CM | POA: Diagnosis not present

## 2020-01-22 DIAGNOSIS — M25511 Pain in right shoulder: Secondary | ICD-10-CM | POA: Diagnosis not present

## 2020-02-01 DIAGNOSIS — M25511 Pain in right shoulder: Secondary | ICD-10-CM | POA: Diagnosis not present

## 2020-02-01 DIAGNOSIS — Z471 Aftercare following joint replacement surgery: Secondary | ICD-10-CM | POA: Diagnosis not present

## 2020-02-01 DIAGNOSIS — Z96611 Presence of right artificial shoulder joint: Secondary | ICD-10-CM | POA: Diagnosis not present

## 2020-02-08 DIAGNOSIS — M25511 Pain in right shoulder: Secondary | ICD-10-CM | POA: Diagnosis not present

## 2020-02-12 DIAGNOSIS — M25511 Pain in right shoulder: Secondary | ICD-10-CM | POA: Diagnosis not present

## 2020-02-14 DIAGNOSIS — M25511 Pain in right shoulder: Secondary | ICD-10-CM | POA: Diagnosis not present

## 2020-02-18 DIAGNOSIS — M25511 Pain in right shoulder: Secondary | ICD-10-CM | POA: Diagnosis not present

## 2020-02-21 DIAGNOSIS — M25511 Pain in right shoulder: Secondary | ICD-10-CM | POA: Diagnosis not present

## 2020-02-25 DIAGNOSIS — M25511 Pain in right shoulder: Secondary | ICD-10-CM | POA: Diagnosis not present

## 2020-02-28 DIAGNOSIS — M25511 Pain in right shoulder: Secondary | ICD-10-CM | POA: Diagnosis not present

## 2020-03-03 DIAGNOSIS — M25511 Pain in right shoulder: Secondary | ICD-10-CM | POA: Diagnosis not present

## 2020-03-05 DIAGNOSIS — M961 Postlaminectomy syndrome, not elsewhere classified: Secondary | ICD-10-CM | POA: Diagnosis not present

## 2020-03-05 DIAGNOSIS — Z79891 Long term (current) use of opiate analgesic: Secondary | ICD-10-CM | POA: Diagnosis not present

## 2020-03-05 DIAGNOSIS — M461 Sacroiliitis, not elsewhere classified: Secondary | ICD-10-CM | POA: Diagnosis not present

## 2020-03-05 DIAGNOSIS — Q761 Klippel-Feil syndrome: Secondary | ICD-10-CM | POA: Diagnosis not present

## 2020-03-07 DIAGNOSIS — M25511 Pain in right shoulder: Secondary | ICD-10-CM | POA: Diagnosis not present

## 2020-03-10 DIAGNOSIS — Z23 Encounter for immunization: Secondary | ICD-10-CM | POA: Diagnosis not present

## 2020-03-11 DIAGNOSIS — M25511 Pain in right shoulder: Secondary | ICD-10-CM | POA: Diagnosis not present

## 2020-03-14 DIAGNOSIS — Z96611 Presence of right artificial shoulder joint: Secondary | ICD-10-CM | POA: Diagnosis not present

## 2020-06-25 DIAGNOSIS — Q761 Klippel-Feil syndrome: Secondary | ICD-10-CM | POA: Diagnosis not present

## 2020-06-25 DIAGNOSIS — M461 Sacroiliitis, not elsewhere classified: Secondary | ICD-10-CM | POA: Diagnosis not present

## 2020-06-25 DIAGNOSIS — M961 Postlaminectomy syndrome, not elsewhere classified: Secondary | ICD-10-CM | POA: Diagnosis not present

## 2020-07-17 DIAGNOSIS — M5416 Radiculopathy, lumbar region: Secondary | ICD-10-CM | POA: Diagnosis not present

## 2020-08-11 DIAGNOSIS — Q761 Klippel-Feil syndrome: Secondary | ICD-10-CM | POA: Diagnosis not present

## 2020-08-11 DIAGNOSIS — M961 Postlaminectomy syndrome, not elsewhere classified: Secondary | ICD-10-CM | POA: Diagnosis not present

## 2020-08-11 DIAGNOSIS — M546 Pain in thoracic spine: Secondary | ICD-10-CM | POA: Diagnosis not present

## 2020-08-11 DIAGNOSIS — M461 Sacroiliitis, not elsewhere classified: Secondary | ICD-10-CM | POA: Diagnosis not present

## 2020-09-08 DIAGNOSIS — H2513 Age-related nuclear cataract, bilateral: Secondary | ICD-10-CM | POA: Diagnosis not present

## 2020-09-08 DIAGNOSIS — H524 Presbyopia: Secondary | ICD-10-CM | POA: Diagnosis not present

## 2020-09-08 DIAGNOSIS — H5203 Hypermetropia, bilateral: Secondary | ICD-10-CM | POA: Diagnosis not present

## 2020-09-08 DIAGNOSIS — H52203 Unspecified astigmatism, bilateral: Secondary | ICD-10-CM | POA: Diagnosis not present

## 2020-09-08 DIAGNOSIS — H25013 Cortical age-related cataract, bilateral: Secondary | ICD-10-CM | POA: Diagnosis not present

## 2020-09-15 DIAGNOSIS — M5414 Radiculopathy, thoracic region: Secondary | ICD-10-CM | POA: Diagnosis not present

## 2020-10-14 DIAGNOSIS — M5414 Radiculopathy, thoracic region: Secondary | ICD-10-CM | POA: Diagnosis not present

## 2020-10-14 DIAGNOSIS — M961 Postlaminectomy syndrome, not elsewhere classified: Secondary | ICD-10-CM | POA: Diagnosis not present

## 2020-10-14 DIAGNOSIS — M461 Sacroiliitis, not elsewhere classified: Secondary | ICD-10-CM | POA: Diagnosis not present

## 2020-10-14 DIAGNOSIS — Q761 Klippel-Feil syndrome: Secondary | ICD-10-CM | POA: Diagnosis not present

## 2020-12-24 DIAGNOSIS — Z471 Aftercare following joint replacement surgery: Secondary | ICD-10-CM | POA: Diagnosis not present

## 2020-12-24 DIAGNOSIS — Z96611 Presence of right artificial shoulder joint: Secondary | ICD-10-CM | POA: Diagnosis not present

## 2020-12-31 DIAGNOSIS — Z23 Encounter for immunization: Secondary | ICD-10-CM | POA: Diagnosis not present

## 2021-01-12 DIAGNOSIS — M5414 Radiculopathy, thoracic region: Secondary | ICD-10-CM | POA: Diagnosis not present

## 2021-01-12 DIAGNOSIS — M461 Sacroiliitis, not elsewhere classified: Secondary | ICD-10-CM | POA: Diagnosis not present

## 2021-01-12 DIAGNOSIS — Q761 Klippel-Feil syndrome: Secondary | ICD-10-CM | POA: Diagnosis not present

## 2021-01-12 DIAGNOSIS — M961 Postlaminectomy syndrome, not elsewhere classified: Secondary | ICD-10-CM | POA: Diagnosis not present

## 2021-01-26 DIAGNOSIS — M5416 Radiculopathy, lumbar region: Secondary | ICD-10-CM | POA: Diagnosis not present

## 2021-02-18 DIAGNOSIS — M961 Postlaminectomy syndrome, not elsewhere classified: Secondary | ICD-10-CM | POA: Diagnosis not present

## 2021-02-18 DIAGNOSIS — M461 Sacroiliitis, not elsewhere classified: Secondary | ICD-10-CM | POA: Diagnosis not present

## 2021-02-18 DIAGNOSIS — I10 Essential (primary) hypertension: Secondary | ICD-10-CM | POA: Diagnosis not present

## 2021-02-18 DIAGNOSIS — Q761 Klippel-Feil syndrome: Secondary | ICD-10-CM | POA: Diagnosis not present

## 2021-02-18 DIAGNOSIS — M5414 Radiculopathy, thoracic region: Secondary | ICD-10-CM | POA: Diagnosis not present

## 2021-03-12 DIAGNOSIS — M5414 Radiculopathy, thoracic region: Secondary | ICD-10-CM | POA: Diagnosis not present

## 2021-04-21 DIAGNOSIS — M461 Sacroiliitis, not elsewhere classified: Secondary | ICD-10-CM | POA: Diagnosis not present

## 2021-04-21 DIAGNOSIS — Q761 Klippel-Feil syndrome: Secondary | ICD-10-CM | POA: Diagnosis not present

## 2021-04-21 DIAGNOSIS — M5414 Radiculopathy, thoracic region: Secondary | ICD-10-CM | POA: Diagnosis not present

## 2021-04-21 DIAGNOSIS — M961 Postlaminectomy syndrome, not elsewhere classified: Secondary | ICD-10-CM | POA: Diagnosis not present

## 2021-07-06 DIAGNOSIS — M461 Sacroiliitis, not elsewhere classified: Secondary | ICD-10-CM | POA: Diagnosis not present

## 2021-07-06 DIAGNOSIS — M961 Postlaminectomy syndrome, not elsewhere classified: Secondary | ICD-10-CM | POA: Diagnosis not present

## 2021-07-06 DIAGNOSIS — Q761 Klippel-Feil syndrome: Secondary | ICD-10-CM | POA: Diagnosis not present

## 2021-07-06 DIAGNOSIS — M546 Pain in thoracic spine: Secondary | ICD-10-CM | POA: Diagnosis not present

## 2021-07-06 DIAGNOSIS — M5414 Radiculopathy, thoracic region: Secondary | ICD-10-CM | POA: Diagnosis not present

## 2021-09-29 DIAGNOSIS — Q761 Klippel-Feil syndrome: Secondary | ICD-10-CM | POA: Diagnosis not present

## 2021-09-29 DIAGNOSIS — M961 Postlaminectomy syndrome, not elsewhere classified: Secondary | ICD-10-CM | POA: Diagnosis not present

## 2021-09-29 DIAGNOSIS — M546 Pain in thoracic spine: Secondary | ICD-10-CM | POA: Diagnosis not present

## 2021-10-19 DIAGNOSIS — M5414 Radiculopathy, thoracic region: Secondary | ICD-10-CM | POA: Diagnosis not present

## 2021-11-13 DIAGNOSIS — Q761 Klippel-Feil syndrome: Secondary | ICD-10-CM | POA: Diagnosis not present

## 2021-11-13 DIAGNOSIS — M961 Postlaminectomy syndrome, not elsewhere classified: Secondary | ICD-10-CM | POA: Diagnosis not present

## 2021-11-13 DIAGNOSIS — M546 Pain in thoracic spine: Secondary | ICD-10-CM | POA: Diagnosis not present

## 2022-02-12 DIAGNOSIS — Q761 Klippel-Feil syndrome: Secondary | ICD-10-CM | POA: Diagnosis not present

## 2022-02-12 DIAGNOSIS — M5414 Radiculopathy, thoracic region: Secondary | ICD-10-CM | POA: Diagnosis not present

## 2022-02-12 DIAGNOSIS — M546 Pain in thoracic spine: Secondary | ICD-10-CM | POA: Diagnosis not present

## 2022-02-12 DIAGNOSIS — M961 Postlaminectomy syndrome, not elsewhere classified: Secondary | ICD-10-CM | POA: Diagnosis not present

## 2022-02-12 DIAGNOSIS — M5416 Radiculopathy, lumbar region: Secondary | ICD-10-CM | POA: Diagnosis not present

## 2022-04-28 DIAGNOSIS — M961 Postlaminectomy syndrome, not elsewhere classified: Secondary | ICD-10-CM | POA: Diagnosis not present

## 2022-04-28 DIAGNOSIS — Q761 Klippel-Feil syndrome: Secondary | ICD-10-CM | POA: Diagnosis not present

## 2022-04-28 DIAGNOSIS — M546 Pain in thoracic spine: Secondary | ICD-10-CM | POA: Diagnosis not present

## 2022-04-30 DIAGNOSIS — M5416 Radiculopathy, lumbar region: Secondary | ICD-10-CM | POA: Diagnosis not present

## 2022-06-25 DIAGNOSIS — G629 Polyneuropathy, unspecified: Secondary | ICD-10-CM | POA: Diagnosis not present

## 2022-06-25 DIAGNOSIS — Z23 Encounter for immunization: Secondary | ICD-10-CM | POA: Diagnosis not present

## 2022-06-25 DIAGNOSIS — E7800 Pure hypercholesterolemia, unspecified: Secondary | ICD-10-CM | POA: Diagnosis not present

## 2022-06-25 DIAGNOSIS — N200 Calculus of kidney: Secondary | ICD-10-CM | POA: Diagnosis not present

## 2022-06-25 DIAGNOSIS — I471 Supraventricular tachycardia, unspecified: Secondary | ICD-10-CM | POA: Diagnosis not present

## 2022-06-25 DIAGNOSIS — Z86010 Personal history of colonic polyps: Secondary | ICD-10-CM | POA: Diagnosis not present

## 2022-06-25 DIAGNOSIS — Z0000 Encounter for general adult medical examination without abnormal findings: Secondary | ICD-10-CM | POA: Diagnosis not present

## 2022-06-25 DIAGNOSIS — I10 Essential (primary) hypertension: Secondary | ICD-10-CM | POA: Diagnosis not present

## 2022-06-25 DIAGNOSIS — R7303 Prediabetes: Secondary | ICD-10-CM | POA: Diagnosis not present

## 2022-06-25 DIAGNOSIS — M545 Low back pain, unspecified: Secondary | ICD-10-CM | POA: Diagnosis not present

## 2022-06-25 DIAGNOSIS — K760 Fatty (change of) liver, not elsewhere classified: Secondary | ICD-10-CM | POA: Diagnosis not present

## 2022-06-25 DIAGNOSIS — R748 Abnormal levels of other serum enzymes: Secondary | ICD-10-CM | POA: Diagnosis not present

## 2022-06-28 ENCOUNTER — Other Ambulatory Visit (HOSPITAL_COMMUNITY): Payer: Self-pay | Admitting: Internal Medicine

## 2022-06-28 DIAGNOSIS — I10 Essential (primary) hypertension: Secondary | ICD-10-CM

## 2022-07-14 ENCOUNTER — Ambulatory Visit (HOSPITAL_COMMUNITY)
Admission: RE | Admit: 2022-07-14 | Discharge: 2022-07-14 | Disposition: A | Discharge status: Home / Self Care | Payer: Medicare Other | Source: Ambulatory Visit | Attending: Internal Medicine | Admitting: Internal Medicine

## 2022-07-14 DIAGNOSIS — I10 Essential (primary) hypertension: Secondary | ICD-10-CM

## 2022-07-28 DIAGNOSIS — M546 Pain in thoracic spine: Secondary | ICD-10-CM | POA: Diagnosis not present

## 2022-07-28 DIAGNOSIS — M961 Postlaminectomy syndrome, not elsewhere classified: Secondary | ICD-10-CM | POA: Diagnosis not present

## 2022-07-28 DIAGNOSIS — I2510 Atherosclerotic heart disease of native coronary artery without angina pectoris: Secondary | ICD-10-CM | POA: Diagnosis not present

## 2022-07-28 DIAGNOSIS — I10 Essential (primary) hypertension: Secondary | ICD-10-CM | POA: Diagnosis not present

## 2022-07-28 DIAGNOSIS — Q761 Klippel-Feil syndrome: Secondary | ICD-10-CM | POA: Diagnosis not present

## 2022-07-29 DIAGNOSIS — I2510 Atherosclerotic heart disease of native coronary artery without angina pectoris: Secondary | ICD-10-CM | POA: Diagnosis not present

## 2022-07-29 DIAGNOSIS — I10 Essential (primary) hypertension: Secondary | ICD-10-CM | POA: Diagnosis not present

## 2022-08-11 DIAGNOSIS — I2510 Atherosclerotic heart disease of native coronary artery without angina pectoris: Secondary | ICD-10-CM | POA: Diagnosis not present

## 2022-08-12 DIAGNOSIS — I2510 Atherosclerotic heart disease of native coronary artery without angina pectoris: Secondary | ICD-10-CM | POA: Diagnosis not present

## 2022-08-12 DIAGNOSIS — E7800 Pure hypercholesterolemia, unspecified: Secondary | ICD-10-CM | POA: Diagnosis not present

## 2022-08-12 DIAGNOSIS — I10 Essential (primary) hypertension: Secondary | ICD-10-CM | POA: Diagnosis not present

## 2022-08-25 ENCOUNTER — Encounter: Payer: Self-pay | Admitting: Cardiovascular Disease

## 2022-08-25 ENCOUNTER — Ambulatory Visit: Payer: Medicare Other | Attending: Cardiovascular Disease | Admitting: Cardiovascular Disease

## 2022-08-25 VITALS — BP 138/80 | HR 70 | Ht 68.0 in | Wt 155.6 lb

## 2022-08-25 DIAGNOSIS — I2510 Atherosclerotic heart disease of native coronary artery without angina pectoris: Secondary | ICD-10-CM | POA: Diagnosis not present

## 2022-08-25 DIAGNOSIS — R002 Palpitations: Secondary | ICD-10-CM | POA: Diagnosis not present

## 2022-08-25 DIAGNOSIS — E782 Mixed hyperlipidemia: Secondary | ICD-10-CM

## 2022-08-25 DIAGNOSIS — I10 Essential (primary) hypertension: Secondary | ICD-10-CM

## 2022-08-25 DIAGNOSIS — R931 Abnormal findings on diagnostic imaging of heart and coronary circulation: Secondary | ICD-10-CM

## 2022-08-25 NOTE — Progress Notes (Signed)
08/25/2022 Dustin Carter   Apr 09, 1956  [id number]  Primary Physician Merri Brunette, MD Primary Cardiologist: Runell Gess MD FACP, [place], [place], [place]  HPI:  Dustin Carter is a 67 y.o.  mildly overweight single Caucasian male children is currently on disability because of multiple back operations. I last saw him in the office 05/17/2017.  His only risk factor is treated hyperlipidemia and family history. He's never had a heart attack or stroke. I did perform a cardiac catheterization on him as an outpatient 11/02/11 which is entirely normal. This hospitalized from [address] of this month with atypical chest pain and palpitations. Event monitor showed sinus rhythm/sinus bradycardia runs of PSVT. His beta blocker was uptitrated. A 2-D echo was normal and a Myoview stress test was low risk. Since I saw him 6 months ago he has noticed some palpitations and atypical chest pain principally since New Year's Day. A recent Holter monitor was unrevealing.  Since I saw him 5 years ago he continues to do well.  He is completely asymptomatic.  He did have a coronary calcium score performed 07/14/2022 which was 1269.  He was placed on a statin drug by his PCP.   Current Meds  Medication Sig   amitriptyline (ELAVIL) 150 MG tablet Take 150 mg by mouth at bedtime.   atorvastatin (LIPITOR) 40 MG tablet Take 40 mg by mouth at bedtime.   cyclobenzaprine (FLEXERIL) 10 MG tablet Take 1 tablet (10 mg total) by mouth 3 (three) times daily as needed for muscle spasms.   gabapentin (NEURONTIN) 300 MG capsule Take 300-600 mg by mouth See admin instructions. Take 300 mg by mouth in the morning and afternoon and 600 mg at night   metoprolol succinate (TOPROL-XL) 100 MG 24 hr tablet TAKE 1 TABLET BY MOUTH EVERY MORNING WITH OR IMMEDIATELY FOLLOWING A MEAL (Patient taking differently: Take 100 mg by mouth daily.)   oxyCODONE-acetaminophen (PERCOCET) 7.5-325 MG tablet Take 1 tablet by mouth every 6 (six) hours as needed for  moderate pain.    pantoprazole (PROTONIX) 40 MG tablet Take 1 tablet (40 mg total) by mouth 2 (two) times daily before a meal. (Patient taking differently: Take 40 mg by mouth daily.)     Allergies  Allergen Reactions   Butrans [Buprenorphine] Itching        Other Itching    Pain patch (unsure of name)    Social History   Socioeconomic History   Marital status: Single    Spouse name: Not on file   Number of children: 0   Years of education: 12   Highest education level: Not on file  Occupational History   Not on file  Tobacco Use   Smoking status: Former    Packs/day: 1.00    Years: 12.00    Additional pack years: 0.00    Total pack years: 12.00    Types: Cigarettes    Quit date: 04/26/1992    Years since quitting: 30.3   Smokeless tobacco: Former    Types: Chew    Quit date: 04/26/1980   Tobacco comments:    used chew for 12 years before started smoking  Vaping Use   Vaping Use: Never used  Substance and Sexual Activity   Alcohol use: Not Currently    Comment: has one shot of Vodka before bed   Drug use: No   Sexual activity: Never  Other Topics Concern   Not on file  Social History Narrative   Not on  file   Social Determinants of Health   Financial Resource Strain: Not on file  Food Insecurity: Not on file  Transportation Needs: Not on file  Physical Activity: Not on file  Stress: Not on file  Social Connections: Not on file  Intimate Partner Violence: Not on file     Review of Systems: General: negative for chills, fever, night sweats or weight changes.  Cardiovascular: negative for chest pain, dyspnea on exertion, edema, orthopnea, palpitations, paroxysmal nocturnal dyspnea or shortness of breath Dermatological: negative for rash Respiratory: negative for cough or wheezing Urologic: negative for hematuria Abdominal: negative for nausea, vomiting, diarrhea, bright red blood per rectum, melena, or hematemesis Neurologic: negative for visual changes,  syncope, or dizziness All other systems reviewed and are otherwise negative except as noted above.    Blood pressure 138/80, pulse 70, height 5\' 8"  (1.727 m), weight 155 lb 9.6 oz (70.6 kg), SpO2 96 %.  General appearance: alert and no distress Neck: no adenopathy, no carotid bruit, no JVD, supple, symmetrical, trachea midline, and thyroid not enlarged, symmetric, no tenderness/mass/nodules Lungs: clear to auscultation bilaterally Heart: regular rate and rhythm, S1, S2 normal, no murmur, click, rub or gallop Extremities: extremities normal, atraumatic, no cyanosis or edema Pulses: 2+ and symmetric Skin: Skin color, texture, turgor normal. No rashes or lesions Neurologic: Grossly normal  EKG sinus rhythm at 70 without ST or T wave changes.  I personally reviewed this EKG.  ASSESSMENT AND PLAN:   Hyperlipidemia History of hyperlipidemia on statin therapy followed by his PCP  Palpitations History of palpitations for the past with event monitor showing runs of PSVT however he no longer has the symptoms.  Essential hypertension History of essential hypertension blood pressure measured today at 138/80.  He is on metoprolol.  Elevated coronary artery calcium score Coronary calcium score performed 07/14/2022 was 1269 most of which was in the LAD and RCA.  He is completely asymptomatic.  Notably, I did perform coronary angiography on him in 2013 revealing normal coronary arteries.  He is also on a statin drug.     Runell Gess MD FACP,FACC,FAHA, [facility] 08/25/2022 2:12 PM

## 2022-08-25 NOTE — Assessment & Plan Note (Signed)
History of palpitations for the past with event monitor showing runs of PSVT however he no longer has the symptoms.

## 2022-08-25 NOTE — Patient Instructions (Signed)
Medication Instructions:   Your physician recommends that you continue on your current medications as directed. Please refer to the Current Medication list given to you today.  *If you need a refill on your cardiac medications before your next appointment, please call your pharmacy*  Lab Work: NONE ordered at this time of appointment   If you have labs (blood work) drawn today and your tests are completely normal, you will receive your results only by: MyChart Message (if you have MyChart) OR A paper copy in the mail If you have any lab test that is abnormal or we need to change your treatment, we will call you to review the results.  Testing/Procedures: NONE ordered at this time of appointment   Follow-Up: At [facility], you and your health needs are our priority.  As part of our continuing mission to provide you with exceptional heart care, we have created designated Provider Care Teams.  These Care Teams include your primary Cardiologist (physician) and Advanced Practice Providers (APPs -  Physician Assistants and Nurse Practitioners) who all work together to provide you with the care you need, when you need it.  Your next appointment:   1 year(s)  Provider:   Jonathan Berry, MD     Other Instructions   

## 2022-08-25 NOTE — Assessment & Plan Note (Signed)
History of hyperlipidemia on statin therapy followed by his PCP 

## 2022-08-25 NOTE — Assessment & Plan Note (Signed)
History of essential hypertension blood pressure measured today at 138/80.  He is on metoprolol.

## 2022-08-25 NOTE — Assessment & Plan Note (Signed)
Coronary calcium score performed 07/14/2022 was 1269 most of which was in the LAD and RCA.  He is completely asymptomatic.  Notably, I did perform coronary angiography on him in 2013 revealing normal coronary arteries.  He is also on a statin drug.

## 2022-08-26 DIAGNOSIS — E789 Disorder of lipoprotein metabolism, unspecified: Secondary | ICD-10-CM | POA: Diagnosis not present

## 2022-09-03 DIAGNOSIS — I10 Essential (primary) hypertension: Secondary | ICD-10-CM | POA: Diagnosis not present

## 2022-09-03 DIAGNOSIS — R748 Abnormal levels of other serum enzymes: Secondary | ICD-10-CM | POA: Diagnosis not present

## 2022-09-22 DIAGNOSIS — R748 Abnormal levels of other serum enzymes: Secondary | ICD-10-CM | POA: Diagnosis not present

## 2022-09-22 DIAGNOSIS — R7989 Other specified abnormal findings of blood chemistry: Secondary | ICD-10-CM | POA: Diagnosis not present

## 2022-10-05 DIAGNOSIS — E7800 Pure hypercholesterolemia, unspecified: Secondary | ICD-10-CM | POA: Diagnosis not present

## 2022-10-05 DIAGNOSIS — R748 Abnormal levels of other serum enzymes: Secondary | ICD-10-CM | POA: Diagnosis not present

## 2022-10-05 DIAGNOSIS — I2510 Atherosclerotic heart disease of native coronary artery without angina pectoris: Secondary | ICD-10-CM | POA: Diagnosis not present

## 2022-10-05 DIAGNOSIS — I10 Essential (primary) hypertension: Secondary | ICD-10-CM | POA: Diagnosis not present

## 2022-11-01 DIAGNOSIS — Q761 Klippel-Feil syndrome: Secondary | ICD-10-CM | POA: Diagnosis not present

## 2022-11-01 DIAGNOSIS — M961 Postlaminectomy syndrome, not elsewhere classified: Secondary | ICD-10-CM | POA: Diagnosis not present

## 2022-11-01 DIAGNOSIS — M546 Pain in thoracic spine: Secondary | ICD-10-CM | POA: Diagnosis not present

## 2023-01-18 DIAGNOSIS — Q761 Klippel-Feil syndrome: Secondary | ICD-10-CM | POA: Diagnosis not present

## 2023-01-18 DIAGNOSIS — M546 Pain in thoracic spine: Secondary | ICD-10-CM | POA: Diagnosis not present

## 2023-01-18 DIAGNOSIS — M961 Postlaminectomy syndrome, not elsewhere classified: Secondary | ICD-10-CM | POA: Diagnosis not present

## 2023-04-11 DIAGNOSIS — M5416 Radiculopathy, lumbar region: Secondary | ICD-10-CM | POA: Diagnosis not present

## 2023-04-11 DIAGNOSIS — M5412 Radiculopathy, cervical region: Secondary | ICD-10-CM | POA: Diagnosis not present

## 2023-04-11 DIAGNOSIS — Q761 Klippel-Feil syndrome: Secondary | ICD-10-CM | POA: Diagnosis not present

## 2023-04-11 DIAGNOSIS — M5414 Radiculopathy, thoracic region: Secondary | ICD-10-CM | POA: Diagnosis not present

## 2023-06-28 DIAGNOSIS — I2510 Atherosclerotic heart disease of native coronary artery without angina pectoris: Secondary | ICD-10-CM | POA: Diagnosis not present

## 2023-06-28 DIAGNOSIS — M545 Low back pain, unspecified: Secondary | ICD-10-CM | POA: Diagnosis not present

## 2023-06-28 DIAGNOSIS — N200 Calculus of kidney: Secondary | ICD-10-CM | POA: Diagnosis not present

## 2023-06-28 DIAGNOSIS — Z0000 Encounter for general adult medical examination without abnormal findings: Secondary | ICD-10-CM | POA: Diagnosis not present

## 2023-06-28 DIAGNOSIS — D696 Thrombocytopenia, unspecified: Secondary | ICD-10-CM | POA: Diagnosis not present

## 2023-06-28 DIAGNOSIS — R748 Abnormal levels of other serum enzymes: Secondary | ICD-10-CM | POA: Diagnosis not present

## 2023-06-28 DIAGNOSIS — R7303 Prediabetes: Secondary | ICD-10-CM | POA: Diagnosis not present

## 2023-06-28 DIAGNOSIS — I10 Essential (primary) hypertension: Secondary | ICD-10-CM | POA: Diagnosis not present

## 2023-06-28 DIAGNOSIS — K219 Gastro-esophageal reflux disease without esophagitis: Secondary | ICD-10-CM | POA: Diagnosis not present

## 2023-07-11 DIAGNOSIS — Z1212 Encounter for screening for malignant neoplasm of rectum: Secondary | ICD-10-CM | POA: Diagnosis not present

## 2023-07-11 DIAGNOSIS — Z1211 Encounter for screening for malignant neoplasm of colon: Secondary | ICD-10-CM | POA: Diagnosis not present

## 2023-07-14 DIAGNOSIS — M5414 Radiculopathy, thoracic region: Secondary | ICD-10-CM | POA: Diagnosis not present

## 2023-07-14 DIAGNOSIS — M5416 Radiculopathy, lumbar region: Secondary | ICD-10-CM | POA: Diagnosis not present

## 2023-07-14 DIAGNOSIS — M5412 Radiculopathy, cervical region: Secondary | ICD-10-CM | POA: Diagnosis not present

## 2023-07-14 DIAGNOSIS — Q761 Klippel-Feil syndrome: Secondary | ICD-10-CM | POA: Diagnosis not present

## 2023-07-26 DIAGNOSIS — M5416 Radiculopathy, lumbar region: Secondary | ICD-10-CM | POA: Diagnosis not present

## 2023-07-27 DIAGNOSIS — R195 Other fecal abnormalities: Secondary | ICD-10-CM | POA: Diagnosis not present

## 2023-07-28 DIAGNOSIS — I10 Essential (primary) hypertension: Secondary | ICD-10-CM | POA: Diagnosis not present

## 2023-07-28 DIAGNOSIS — R195 Other fecal abnormalities: Secondary | ICD-10-CM | POA: Diagnosis not present

## 2023-08-10 DIAGNOSIS — K635 Polyp of colon: Secondary | ICD-10-CM | POA: Diagnosis not present

## 2023-08-10 DIAGNOSIS — K5730 Diverticulosis of large intestine without perforation or abscess without bleeding: Secondary | ICD-10-CM | POA: Diagnosis not present

## 2023-08-10 DIAGNOSIS — D128 Benign neoplasm of rectum: Secondary | ICD-10-CM | POA: Diagnosis not present

## 2023-08-10 DIAGNOSIS — R195 Other fecal abnormalities: Secondary | ICD-10-CM | POA: Diagnosis not present

## 2023-08-15 DIAGNOSIS — K635 Polyp of colon: Secondary | ICD-10-CM | POA: Diagnosis not present

## 2023-08-15 DIAGNOSIS — D128 Benign neoplasm of rectum: Secondary | ICD-10-CM | POA: Diagnosis not present

## 2023-08-31 ENCOUNTER — Emergency Department (HOSPITAL_COMMUNITY)

## 2023-08-31 ENCOUNTER — Encounter (HOSPITAL_COMMUNITY): Payer: Self-pay

## 2023-08-31 ENCOUNTER — Inpatient Hospital Stay (HOSPITAL_COMMUNITY)
Admission: EM | Admit: 2023-08-31 | Discharge: 2023-09-03 | DRG: 871 | Disposition: A | Discharge status: Home / Self Care | Attending: Hospitalist | Admitting: Hospitalist

## 2023-08-31 DIAGNOSIS — J189 Pneumonia, unspecified organism: Secondary | ICD-10-CM | POA: Diagnosis present

## 2023-08-31 DIAGNOSIS — R0602 Shortness of breath: Secondary | ICD-10-CM | POA: Diagnosis not present

## 2023-08-31 DIAGNOSIS — Z9049 Acquired absence of other specified parts of digestive tract: Secondary | ICD-10-CM

## 2023-08-31 DIAGNOSIS — Z888 Allergy status to other drugs, medicaments and biological substances status: Secondary | ICD-10-CM

## 2023-08-31 DIAGNOSIS — Z87891 Personal history of nicotine dependence: Secondary | ICD-10-CM

## 2023-08-31 DIAGNOSIS — J9601 Acute respiratory failure with hypoxia: Secondary | ICD-10-CM | POA: Diagnosis not present

## 2023-08-31 DIAGNOSIS — M79603 Pain in arm, unspecified: Secondary | ICD-10-CM | POA: Diagnosis not present

## 2023-08-31 DIAGNOSIS — Z79899 Other long term (current) drug therapy: Secondary | ICD-10-CM

## 2023-08-31 DIAGNOSIS — I2510 Atherosclerotic heart disease of native coronary artery without angina pectoris: Secondary | ICD-10-CM | POA: Diagnosis not present

## 2023-08-31 DIAGNOSIS — Z7982 Long term (current) use of aspirin: Secondary | ICD-10-CM

## 2023-08-31 DIAGNOSIS — R55 Syncope and collapse: Secondary | ICD-10-CM | POA: Diagnosis not present

## 2023-08-31 DIAGNOSIS — R042 Hemoptysis: Secondary | ICD-10-CM | POA: Diagnosis not present

## 2023-08-31 DIAGNOSIS — A419 Sepsis, unspecified organism: Principal | ICD-10-CM | POA: Diagnosis present

## 2023-08-31 DIAGNOSIS — N400 Enlarged prostate without lower urinary tract symptoms: Secondary | ICD-10-CM | POA: Diagnosis present

## 2023-08-31 DIAGNOSIS — R6520 Severe sepsis without septic shock: Secondary | ICD-10-CM | POA: Diagnosis not present

## 2023-08-31 DIAGNOSIS — Z87442 Personal history of urinary calculi: Secondary | ICD-10-CM | POA: Diagnosis not present

## 2023-08-31 DIAGNOSIS — Z823 Family history of stroke: Secondary | ICD-10-CM

## 2023-08-31 DIAGNOSIS — N200 Calculus of kidney: Secondary | ICD-10-CM | POA: Diagnosis not present

## 2023-08-31 DIAGNOSIS — E871 Hypo-osmolality and hyponatremia: Secondary | ICD-10-CM | POA: Diagnosis present

## 2023-08-31 DIAGNOSIS — I700 Atherosclerosis of aorta: Secondary | ICD-10-CM | POA: Diagnosis not present

## 2023-08-31 DIAGNOSIS — Z96611 Presence of right artificial shoulder joint: Secondary | ICD-10-CM | POA: Diagnosis present

## 2023-08-31 DIAGNOSIS — E876 Hypokalemia: Secondary | ICD-10-CM | POA: Diagnosis not present

## 2023-08-31 DIAGNOSIS — R410 Disorientation, unspecified: Secondary | ICD-10-CM | POA: Diagnosis not present

## 2023-08-31 DIAGNOSIS — E872 Acidosis, unspecified: Secondary | ICD-10-CM | POA: Diagnosis not present

## 2023-08-31 DIAGNOSIS — E785 Hyperlipidemia, unspecified: Secondary | ICD-10-CM | POA: Diagnosis present

## 2023-08-31 DIAGNOSIS — R296 Repeated falls: Secondary | ICD-10-CM | POA: Diagnosis present

## 2023-08-31 DIAGNOSIS — M479 Spondylosis, unspecified: Secondary | ICD-10-CM | POA: Diagnosis present

## 2023-08-31 DIAGNOSIS — K219 Gastro-esophageal reflux disease without esophagitis: Secondary | ICD-10-CM | POA: Diagnosis present

## 2023-08-31 DIAGNOSIS — I10 Essential (primary) hypertension: Secondary | ICD-10-CM | POA: Diagnosis present

## 2023-08-31 DIAGNOSIS — Z8249 Family history of ischemic heart disease and other diseases of the circulatory system: Secondary | ICD-10-CM | POA: Diagnosis not present

## 2023-08-31 DIAGNOSIS — R079 Chest pain, unspecified: Secondary | ICD-10-CM | POA: Diagnosis not present

## 2023-08-31 DIAGNOSIS — E861 Hypovolemia: Secondary | ICD-10-CM | POA: Diagnosis present

## 2023-08-31 DIAGNOSIS — Z96612 Presence of left artificial shoulder joint: Secondary | ICD-10-CM | POA: Diagnosis present

## 2023-08-31 DIAGNOSIS — Z981 Arthrodesis status: Secondary | ICD-10-CM

## 2023-08-31 DIAGNOSIS — G629 Polyneuropathy, unspecified: Secondary | ICD-10-CM | POA: Diagnosis present

## 2023-08-31 DIAGNOSIS — I771 Stricture of artery: Secondary | ICD-10-CM | POA: Diagnosis not present

## 2023-08-31 DIAGNOSIS — J984 Other disorders of lung: Secondary | ICD-10-CM | POA: Diagnosis not present

## 2023-08-31 DIAGNOSIS — G8929 Other chronic pain: Secondary | ICD-10-CM | POA: Diagnosis present

## 2023-08-31 DIAGNOSIS — R918 Other nonspecific abnormal finding of lung field: Secondary | ICD-10-CM | POA: Diagnosis not present

## 2023-08-31 DIAGNOSIS — R531 Weakness: Secondary | ICD-10-CM | POA: Diagnosis present

## 2023-08-31 DIAGNOSIS — Z83438 Family history of other disorder of lipoprotein metabolism and other lipidemia: Secondary | ICD-10-CM

## 2023-08-31 LAB — COMPREHENSIVE METABOLIC PANEL WITH GFR
ALT: 26 U/L (ref 0–44)
AST: 35 U/L (ref 15–41)
Albumin: 3.2 g/dL — ABNORMAL LOW (ref 3.5–5.0)
Alkaline Phosphatase: 73 U/L (ref 38–126)
Anion gap: 12 (ref 5–15)
BUN: 13 mg/dL (ref 8–23)
CO2: 25 mmol/L (ref 22–32)
Calcium: 8.9 mg/dL (ref 8.9–10.3)
Chloride: 95 mmol/L — ABNORMAL LOW (ref 98–111)
Creatinine, Ser: 1.61 mg/dL — ABNORMAL HIGH (ref 0.61–1.24)
GFR, Estimated: 46 mL/min — ABNORMAL LOW (ref >60)
Glucose, Bld: 89 mg/dL (ref 70–99)
Potassium: 3.3 mmol/L — ABNORMAL LOW (ref 3.5–5.1)
Sodium: 132 mmol/L — ABNORMAL LOW (ref 135–145)
Total Bilirubin: 0.8 mg/dL (ref 0.0–1.2)
Total Protein: 5.5 g/dL — ABNORMAL LOW (ref 6.5–8.1)

## 2023-08-31 LAB — I-STAT CG4 LACTIC ACID, ED
Lactic Acid, Venous: 3.2 mmol/L (ref 0.5–1.9)
Lactic Acid, Venous: 3.3 mmol/L (ref 0.5–1.9)
Lactic Acid, Venous: 1.4 mmol/L (ref 0.5–1.9)

## 2023-08-31 LAB — CBC WITH DIFFERENTIAL/PLATELET
Abs Immature Granulocytes: 0.02 10*3/uL (ref 0.00–0.07)
Basophils Absolute: 0 10*3/uL (ref 0.0–0.1)
Basophils Relative: 0 %
Eosinophils Absolute: 0 10*3/uL (ref 0.0–0.5)
Eosinophils Relative: 0 %
HCT: 38.5 % — ABNORMAL LOW (ref 39.0–52.0)
Hemoglobin: 13.3 g/dL (ref 13.0–17.0)
Immature Granulocytes: 0 %
Lymphocytes Relative: 6 %
Lymphs Abs: 0.5 10*3/uL — ABNORMAL LOW (ref 0.7–4.0)
MCH: 31.7 pg (ref 26.0–34.0)
MCHC: 34.5 g/dL (ref 30.0–36.0)
MCV: 91.9 fL (ref 80.0–100.0)
Monocytes Absolute: 0.4 10*3/uL (ref 0.1–1.0)
Monocytes Relative: 5 %
Neutro Abs: 7.8 10*3/uL — ABNORMAL HIGH (ref 1.7–7.7)
Neutrophils Relative %: 89 %
Platelets: 166 10*3/uL (ref 150–400)
RBC: 4.19 MIL/uL — ABNORMAL LOW (ref 4.22–5.81)
RDW: 12.8 % (ref 11.5–15.5)
WBC: 8.8 10*3/uL (ref 4.0–10.5)
nRBC: 0 % (ref 0.0–0.2)

## 2023-08-31 LAB — CBC
HCT: 33.3 % — ABNORMAL LOW (ref 39.0–52.0)
Hemoglobin: 11.3 g/dL — ABNORMAL LOW (ref 13.0–17.0)
MCH: 31.1 pg (ref 26.0–34.0)
MCHC: 33.9 g/dL (ref 30.0–36.0)
MCV: 91.7 fL (ref 80.0–100.0)
Platelets: 108 10*3/uL — ABNORMAL LOW (ref 150–400)
RBC: 3.63 MIL/uL — ABNORMAL LOW (ref 4.22–5.81)
RDW: 13 % (ref 11.5–15.5)
WBC: 12.3 10*3/uL — ABNORMAL HIGH (ref 4.0–10.5)
nRBC: 0 % (ref 0.0–0.2)

## 2023-08-31 LAB — CREATININE, SERUM
Creatinine, Ser: 1.04 mg/dL (ref 0.61–1.24)
GFR, Estimated: >60 mL/min (ref >60)

## 2023-08-31 LAB — I-STAT VENOUS BLOOD GAS, ED
Acid-Base Excess: 0 mmol/L (ref 0.0–2.0)
Bicarbonate: 25.2 mmol/L (ref 20.0–28.0)
Calcium, Ion: 1.08 mmol/L — ABNORMAL LOW (ref 1.15–1.40)
HCT: 38 % — ABNORMAL LOW (ref 39.0–52.0)
Hemoglobin: 12.9 g/dL — ABNORMAL LOW (ref 13.0–17.0)
O2 Saturation: 81 %
Potassium: 3.3 mmol/L — ABNORMAL LOW (ref 3.5–5.1)
Sodium: 131 mmol/L — ABNORMAL LOW (ref 135–145)
TCO2: 26 mmol/L (ref 22–32)
pCO2, Ven: 43.6 mmHg — ABNORMAL LOW (ref 44–60)
pH, Ven: 7.369 (ref 7.25–7.43)
pO2, Ven: 47 mmHg — ABNORMAL HIGH (ref 32–45)

## 2023-08-31 LAB — I-STAT CHEM 8, ED
BUN: 19 mg/dL (ref 8–23)
Calcium, Ion: 1.09 mmol/L — ABNORMAL LOW (ref 1.15–1.40)
Chloride: 93 mmol/L — ABNORMAL LOW (ref 98–111)
Creatinine, Ser: 1.6 mg/dL — ABNORMAL HIGH (ref 0.61–1.24)
Glucose, Bld: 86 mg/dL (ref 70–99)
HCT: 40 % (ref 39.0–52.0)
Hemoglobin: 13.6 g/dL (ref 13.0–17.0)
Potassium: 3.4 mmol/L — ABNORMAL LOW (ref 3.5–5.1)
Sodium: 131 mmol/L — ABNORMAL LOW (ref 135–145)
TCO2: 25 mmol/L (ref 22–32)

## 2023-08-31 LAB — RESP PANEL BY RT-PCR (RSV, FLU A&B, COVID)  RVPGX2
Influenza A by PCR: NEGATIVE
Influenza B by PCR: NEGATIVE
Resp Syncytial Virus by PCR: NEGATIVE
SARS Coronavirus 2 by RT PCR: NEGATIVE

## 2023-08-31 LAB — POC OCCULT BLOOD, ED: Fecal Occult Bld: NEGATIVE

## 2023-08-31 LAB — TROPONIN I (HIGH SENSITIVITY)
Troponin I (High Sensitivity): 8 ng/L (ref <18)
Troponin I (High Sensitivity): 8 ng/L (ref <18)

## 2023-08-31 LAB — PROTIME-INR
INR: 1.1 (ref 0.8–1.2)
Prothrombin Time: 14.1 s (ref 11.4–15.2)

## 2023-08-31 LAB — TYPE AND SCREEN
ABO/RH(D): O POS
Antibody Screen: NEGATIVE

## 2023-08-31 LAB — HIV ANTIBODY (ROUTINE TESTING W REFLEX): HIV Screen 4th Generation wRfx: NONREACTIVE

## 2023-08-31 MED ORDER — METOPROLOL SUCCINATE ER 25 MG PO TB24
50.0000 mg | ORAL_TABLET | Freq: Two times a day (BID) | ORAL | Status: DC
  Administered 2023-08-31 – 2023-09-03 (×6): 50 mg via ORAL
  Filled 2023-08-31 (×6): qty 2

## 2023-08-31 MED ORDER — ENOXAPARIN SODIUM 40 MG/0.4ML IJ SOSY
40.0000 mg | PREFILLED_SYRINGE | INTRAMUSCULAR | Status: DC
  Administered 2023-08-31 – 2023-09-02 (×3): 40 mg via SUBCUTANEOUS
  Filled 2023-08-31 (×3): qty 0.4

## 2023-08-31 MED ORDER — SODIUM CHLORIDE 0.9 % IV SOLN
2.0000 g | INTRAVENOUS | Status: DC
  Administered 2023-09-01 – 2023-09-03 (×3): 2 g via INTRAVENOUS
  Filled 2023-08-31 (×3): qty 20

## 2023-08-31 MED ORDER — AZITHROMYCIN 250 MG PO TABS: 500.0000 mg | ORAL_TABLET | Freq: Every day | ORAL | Status: DC

## 2023-08-31 MED ORDER — IPRATROPIUM-ALBUTEROL 0.5-2.5 (3) MG/3ML IN SOLN
3.0000 mL | Freq: Three times a day (TID) | RESPIRATORY_TRACT | Status: DC
  Administered 2023-09-01: 3 mL via RESPIRATORY_TRACT
  Filled 2023-08-31: qty 3

## 2023-08-31 MED ORDER — ASPIRIN 81 MG PO CHEW
81.0000 mg | CHEWABLE_TABLET | Freq: Every day | ORAL | Status: DC
  Administered 2023-08-31 – 2023-09-02 (×3): 81 mg via ORAL
  Filled 2023-08-31 (×3): qty 1

## 2023-08-31 MED ORDER — IOHEXOL 350 MG/ML SOLN
100.0000 mL | Freq: Once | INTRAVENOUS | Status: AC | PRN
  Administered 2023-08-31: 100 mL via INTRAVENOUS

## 2023-08-31 MED ORDER — IPRATROPIUM-ALBUTEROL 0.5-2.5 (3) MG/3ML IN SOLN
3.0000 mL | Freq: Four times a day (QID) | RESPIRATORY_TRACT | Status: DC
  Administered 2023-08-31: 3 mL via RESPIRATORY_TRACT
  Filled 2023-08-31 (×2): qty 3

## 2023-08-31 MED ORDER — PANTOPRAZOLE SODIUM 40 MG PO TBEC
40.0000 mg | DELAYED_RELEASE_TABLET | Freq: Two times a day (BID) | ORAL | Status: DC
  Administered 2023-08-31 – 2023-09-03 (×6): 40 mg via ORAL
  Filled 2023-08-31 (×6): qty 1

## 2023-08-31 MED ORDER — LACTATED RINGERS IV SOLN: INTRAVENOUS | Status: AC

## 2023-08-31 MED ORDER — LACTATED RINGERS IV BOLUS
1000.0000 mL | Freq: Once | INTRAVENOUS | Status: AC
  Administered 2023-08-31: 1000 mL via INTRAVENOUS

## 2023-08-31 MED ORDER — AMLODIPINE BESYLATE 10 MG PO TABS
10.0000 mg | ORAL_TABLET | Freq: Every day | ORAL | Status: DC
  Administered 2023-09-01: 10 mg via ORAL
  Filled 2023-08-31: qty 1

## 2023-08-31 MED ORDER — SODIUM CHLORIDE 0.9 % IV BOLUS (SEPSIS)
1000.0000 mL | Freq: Once | INTRAVENOUS | Status: AC
  Administered 2023-08-31: 1000 mL via INTRAVENOUS

## 2023-08-31 MED ORDER — SODIUM CHLORIDE 0.9 % IV BOLUS
1000.0000 mL | Freq: Once | INTRAVENOUS | Status: AC
  Administered 2023-08-31: 1000 mL via INTRAVENOUS

## 2023-08-31 MED ORDER — SODIUM CHLORIDE 0.9 % IV SOLN
500.0000 mg | Freq: Once | INTRAVENOUS | Status: AC
  Administered 2023-08-31: 500 mg via INTRAVENOUS
  Filled 2023-08-31: qty 5

## 2023-08-31 MED ORDER — CEFTRIAXONE SODIUM 2 G IJ SOLR
2.0000 g | Freq: Once | INTRAMUSCULAR | Status: AC
  Administered 2023-08-31: 2 g via INTRAVENOUS
  Filled 2023-08-31: qty 20

## 2023-08-31 MED ORDER — ATORVASTATIN CALCIUM 40 MG PO TABS
40.0000 mg | ORAL_TABLET | Freq: Every day | ORAL | Status: DC
  Administered 2023-08-31 – 2023-09-02 (×3): 40 mg via ORAL
  Filled 2023-08-31 (×3): qty 1

## 2023-08-31 NOTE — Sepsis Progress Note (Signed)
 Elink will follow per sepsis protocol.

## 2023-08-31 NOTE — ED Triage Notes (Signed)
 Pt presents to ED via EMS from home. EMS reports that neighbor states they saw him doing yard work yesterday. Lower back pain, arm pain, chest pain, multiple falls inside home and hit head but no thinners. Reports coughing up blood, EMS saw multiple handkerchief full of bright red blood.  Unresponsive when fire arrived. Responsive when arriving to ED but mildly confused. Bp 50 systolic initially, up to 90 in trendelenburg.    76/44 110 BG 87 Pulse ox not reading

## 2023-08-31 NOTE — H&P (Signed)
 History and Physical    Patient: Dustin Carter NGE:[id number] DOB: 02/16/1956 DOA: 08/31/2023 DOS: the patient was seen and examined on 08/31/2023 PCP: Imelda Man, MD  Patient coming from: Home  Chief Complaint:  Chief Complaint  Patient presents with   Weakness   HPI: Dustin Carter is a 68 y.o. male with medical history significant of HTN/HLD, and multiple back surgeries p/w LLL CAP c/b GLF.  Pt states that he was in his USOH until this morning when he fell 8x due to dizziness. Pt states that he woke up, felt dizzy, and fell multiple times on his way to the front door. After his first fall, he called his sister, Siri Duet, who notified Aleda Hurl his niece to go over and check on him. Aleda Hurl arrived at the patient's home, was unable to get in, but heard the pt fall; thus, she activated EMS. Pt eventually crawled to and opened the door prior to EMS arrival. Pt denies a h/o falls and is fairly active and able to complete all his ADLs. In fact, pt spent yesterday power washing his deck and house from 11a to 7p, and think this may have "tired him out."  In the ED, pt noted to have LLL CAP and admitted to medicine for ongoing care.   Review of Systems: As mentioned in the history of present illness. All other systems reviewed and are negative. Past Medical History:  Diagnosis Date   Arthritis    SPINE AND "HEAD TO TOES"   Atypical chest pain at rest   cardiologist-- dr berry-- work up done per note all normal (stress test and cardiac cath)   BPH (benign prostatic hypertrophy) Mar 29, 2011   TREATED IN DR. TANNENBAUM'S OFFICE WITH "HEAT" THERAPY TO SHRINK THE PROSTATE   DOE (dyspnea on exertion)    Family history of adverse reaction to anesthesia    MOTHER--- HARD TO WAKE   Full dentures    GERD (gastroesophageal reflux disease)    History of kidney stones    Hyperlipidemia    Hypertension    Polyneuropathy    DUE TO POST SURGERY'S   PONV (postoperative nausea and vomiting)    hx of with first  back surgery    Sinus tachycardia cardiologist- dr berry   hx palpitations w/ holter event monitor showed SR, SB, runs of PSVT-- pt takes beta blocker   Wears glasses    Past Surgical History:  Procedure Laterality Date   ANTERIOR CERVICAL DECOMP/DISCECTOMY FUSION  03/09/2004;  04-29-2008   C5-6 (03-09-2004)  C3-4 (04-29-2008   BIOPSY  01/28/2019   Procedure: BIOPSY;  Surgeon: Celedonio Coil, MD;  Location: [facility] ENDOSCOPY;  Service: Endoscopy;;   CARDIOVASCULAR STRESS TEST  08-30-2016  dr berry   Low risk nuclear study w/ normal perfusion and mild reduced LV global systolic function (study was gated , no percentage on results) recommend echo   CERVICAL FUSION  11/01/2005   C6-7 diskecotmy, decompression and fusion/  removal hardware C5-6   CHOLECYSTECTOMY  12/29/2011   Procedure: LAPAROSCOPIC CHOLECYSTECTOMY WITH INTRAOPERATIVE CHOLANGIOGRAM;  Surgeon: Fran Imus, MD,FACS;  Location: [facility] ORS;  Service: General;  Laterality: N/A;   CYSTOSCOPY W/ URETERAL STENT PLACEMENT Left 12-05-1999;  01-02-2000   CYSTOSCOPY/RETROGRADE/URETEROSCOPY/STONE EXTRACTION WITH BASKET Left 04/15/2014   Procedure: CYSTOSCOPY,  LEFT RETROGRADE PYELOGRAM,  LEFT URETEROSCOPY,  LEFT STONE EXTRACTION WITH BASKET, LEFT STENT PLACEMENT ;  Surgeon: Edmund Gouge, MD;  Location: [facility] ORS;  Service: Urology;  Laterality: Left;   CYSTOSCOPY/RETROGRADE/URETEROSCOPY/STONE  EXTRACTION WITH BASKET Bilateral left 12-14-1999;  right 11-10-2007  dr Isla Mari   CYSTOSCOPY/URETEROSCOPY/HOLMIUM LASER/STENT PLACEMENT Bilateral 12/29/2016   Procedure: CYSTOSCOPY RIGHT URETEROSCOPY POSSIBLE LEFT  WITH HOLMIUM LASER AND URETERAL STENT PLACEMENT;  Surgeon: Samson Croak, MD;  Location: [facility];  Service: Urology;  Laterality: Bilateral;   ESOPHAGOGASTRODUODENOSCOPY (EGD) WITH PROPOFOL  N/A 01/28/2019   Procedure: ESOPHAGOGASTRODUODENOSCOPY (EGD) WITH PROPOFOL ;  Surgeon: Celedonio Coil, MD;  Location: [facility] ENDOSCOPY;   Service: Endoscopy;  Laterality: N/A;   HOLMIUM LASER APPLICATION Left 04/15/2014   Procedure: HOLMIUM LASER APPLICATION;  Surgeon: Edmund Gouge, MD;  Location: [facility] ORS;  Service: Urology;  Laterality: Left;   IRRIGATION AND DEBRIDEMENT ABSCESS  07/27/2009   left index finger   LEFT HEART CATHETERIZATION WITH CORONARY ANGIOGRAM N/A 11/02/2011   Procedure: LEFT HEART CATHETERIZATION WITH CORONARY ANGIOGRAM;  Surgeon: [name], MD;  Location: [facility] CATH LAB;  Service: Cardiovascular;  Laterality: N/A;  normal coronary arteries and lvf   LUMBAR DISC SURGERY  10-15-2008  dr hirsch   redo laminectomy L4-5 w/ fusion/  revision L3-5 fusion   LUMBAR FUSION  06-29-2001  dr hirsch   L3-4 diskectomy/ L1-2 lam. & disk./  fusion L1-4/  open reduction and internal fixation spondylolisthesis L2-3   LUMBAR LAMINECTOMY/DECOMPRESSION MICRODISCECTOMY  02/29/2012   Procedure: LUMBAR LAMINECTOMY/DECOMPRESSION MICRODISCECTOMY 1 LEVEL;  Surgeon: Shary Deems, MD;  Location: [facility] NEURO ORS;  Service: Neurosurgery;  Laterality: Right;  Right Lumbar five-sacral oneDiskectomy   LUMBAR SPINE SURGERY  1997   POSTERIOR FUSION CERVICAL SPINE  06/06/2006   C6-7 and C7-T1   SPINE SURGERY  05-24-2000  dr hirsch   L3-4 laminectomy/ diskectomy;  T9-10 diskectomy/ fusion   TONSILLECTOMY AND ADENOIDECTOMY  age 63   TOTAL SHOULDER ARTHROPLASTY Left 01-08-2010  dr supple [facility]   TOTAL SHOULDER ARTHROPLASTY Right 12/20/2019   Procedure: TOTAL SHOULDER ARTHROPLASTY;  Surgeon: Ellard Gunning, MD;  Location: [facility] ORS;  Service: Orthopedics;  Laterality: Right;    TRANSTHORACIC ECHOCARDIOGRAM  09-10-2016   dr berry   ef 60-65%/  mild AV calcification without stenosis, no regurg./  mild MR and TR/ mild LAE   Social History:  reports that he quit smoking about 31 years ago. His smoking use included cigarettes. He started smoking about 43 years ago. He has a 12 pack-year smoking history. He quit smokeless tobacco use about 43  years ago.  His smokeless tobacco use included chew. He reports that he does not currently use alcohol. He reports that he does not use drugs.  Allergies  Allergen Reactions   Butrans [Buprenorphine] Itching        Other Itching    Pain patch (unsure of name)    Family History  Problem Relation Age of Onset   Heart disease Father    Hyperlipidemia Father    Heart disease Maternal Grandmother    Stroke Paternal Grandfather    Heart disease Brother    Hyperlipidemia Brother    Cancer Maternal Aunt        brain   Heart disease Maternal Uncle    Cancer Paternal Uncle        liver   Heart disease Brother    Hyperlipidemia Brother    Heart disease Maternal Uncle     Prior to Admission medications   Medication Sig Start Date End Date Taking? Authorizing Provider  amitriptyline  (ELAVIL ) 150 MG tablet Take 150 mg by mouth at bedtime.   Yes [provider]  amLODipine  (NORVASC ) 10 MG tablet Take 10 mg by mouth daily. 06/14/23  Yes [provider]  aspirin  81 MG chewable tablet Chew 81 mg by mouth daily. 07/28/22  Yes [provider]  atorvastatin  (LIPITOR) 40 MG tablet Take 40 mg by mouth at bedtime.   Yes [provider]  cyclobenzaprine  (FLEXERIL ) 10 MG tablet Take 1 tablet (10 mg total) by mouth 3 (three) times daily as needed for muscle spasms. Patient taking differently: Take 10 mg by mouth 2 (two) times daily. 12/20/19  Yes Shuford, Sherrlyn Dolores, PA-C  gabapentin  (NEURONTIN ) 300 MG capsule Take 300-600 mg by mouth See admin instructions. Take 300 mg by mouth in the morning and afternoon and 600 mg at night   Yes [provider]  hydrochlorothiazide (HYDRODIURIL) 12.5 MG tablet Take 12.5 mg by mouth daily.   Yes [provider]  metoprolol  succinate (TOPROL -XL) 100 MG 24 hr tablet TAKE 1 TABLET BY MOUTH EVERY MORNING WITH OR IMMEDIATELY FOLLOWING A MEAL Patient taking differently: Take 50 mg by mouth 2 (two) times daily. 07/11/17  Yes  [name], MD  olmesartan-hydrochlorothiazide (BENICAR HCT) 40-25 MG tablet Take 1 tablet by mouth daily. 06/28/23  Yes [provider]  oxyCODONE -acetaminophen  (PERCOCET) 7.5-325 MG tablet Take 1 tablet by mouth every 6 (six) hours as needed for moderate pain.  06/15/19  Yes [provider]  pantoprazole  (PROTONIX ) 40 MG tablet Take 1 tablet (40 mg total) by mouth 2 (two) times daily before a meal. Patient taking differently: Take 40 mg by mouth daily. 01/28/19  Yes Sedalia Dacosta, MD    Physical Exam: Vitals:   08/31/23 1430 08/31/23 1552 08/31/23 1600 08/31/23 1603  BP: 97/61 113/71 97/66   Pulse: 93 91 89   Resp: 16 (!) 22 16   Temp:    98.4 F (36.9 C)  TempSrc:    Axillary  SpO2: 96% 94% 96%   Weight:      Height:       General: Alert, oriented x3, resting comfortably in no acute distress Respiratory: L basilar rhonci; no wheezing; supplemental O2 in place Cardiovascular: Regular rate and rhythm w/o m/r/g  Data Reviewed: {Tip this will not be part of the note when signed- Document your independent interpretation of telemetry tracing, EKG, lab, Radiology test or any other diagnostic tests. Add any new diagnostic test ordered today. (Optional):[id number]} Lab Results  Component Value Date   WBC 8.8 08/31/2023   HGB 12.9 (L) 08/31/2023   HGB 13.6 08/31/2023   HCT 38.0 (L) 08/31/2023   HCT 40.0 08/31/2023   MCV 91.9 08/31/2023   PLT 166 08/31/2023   Lab Results  Component Value Date   GLUCOSE 86 08/31/2023   CALCIUM  8.9 08/31/2023   NA 131 (L) 08/31/2023   NA 131 (L) 08/31/2023   K 3.3 (L) 08/31/2023   K 3.4 (L) 08/31/2023   CO2 25 08/31/2023   CL 93 (L) 08/31/2023   BUN 19 08/31/2023   CREATININE 1.60 (H) 08/31/2023   Lab Results  Component Value Date   ALT 26 08/31/2023   AST 35 08/31/2023   ALKPHOS 73 08/31/2023   BILITOT 0.8 08/31/2023   Lab Results  Component Value Date   INR 1.1 08/31/2023   INR 1.09 02/29/2012   INR 1.02 01/06/2010     Radiology: CT ABDOMEN PELVIS W CONTRAST Result Date: 08/31/2023 CLINICAL DATA:  Low back pain EXAM: CT ABDOMEN AND PELVIS WITH CONTRAST TECHNIQUE: Multidetector CT imaging of the abdomen and pelvis  was performed using the standard protocol following bolus administration of intravenous contrast. RADIATION DOSE REDUCTION: This exam was performed according to the departmental dose-optimization program which includes automated exposure control, adjustment of the mA and/or kV according to patient size and/or use of iterative reconstruction technique. CONTRAST:  OMNIPAQUE  IOHEXOL  350 MG/ML SOLN COMPARISON:  CTA chest same day FINDINGS: Lower chest: Dense pneumonia within the LEFT lower lobe. Patchy airspace disease in the RIGHT lower lobe. Hepatobiliary: No focal hepatic lesion. Postcholecystectomy. No biliary dilatation. Pancreas: Pancreas is normal. No ductal dilatation. No pancreatic inflammation. Spleen: Normal spleen Adrenals/urinary tract: Adrenal glands normal. Nonobstructing calculus measuring 6 mm in the mid LEFT kidney. Ureters and bladder normal. Stomach/Bowel: The stomach, duodenum, and small bowel normal. Moderate volume stool throughout the colon. Vascular/Lymphatic: Abdominal aorta is normal caliber with atherosclerotic calcification. There is no retroperitoneal or periportal lymphadenopathy. No pelvic lymphadenopathy. Reproductive: Prostate unremarkable Other: No free fluid. Musculoskeletal: No aggressive osseous lesion. Posterior lumbar fusion. IMPRESSION: 1. Dense pneumonia in the LEFT lower lobe. 2. Moderate volume stool throughout the colon. Recommend correlation with constipation. 3. Nonobstructing LEFT renal calculus. 4.  Aortic Atherosclerosis (ICD10-I70.0). Electronically Signed   By: Deboraha Fallow M.D.   On: 08/31/2023 14:32   CT Angio Chest PE W and/or Wo Contrast Result Date: 08/31/2023 CLINICAL DATA:  Chest pain.  Hemoptysis. EXAM: CT ANGIOGRAPHY CHEST WITH CONTRAST TECHNIQUE:  Multidetector CT imaging of the chest was performed using the standard protocol during bolus administration of intravenous contrast. Multiplanar CT image reconstructions and MIPs were obtained to evaluate the vascular anatomy. RADIATION DOSE REDUCTION: This exam was performed according to the departmental dose-optimization program which includes automated exposure control, adjustment of the mA and/or kV according to patient size and/or use of iterative reconstruction technique. CONTRAST:  OMNIPAQUE  IOHEXOL  350 MG/ML SOLN COMPARISON:  July 14, 2022. FINDINGS: Cardiovascular: Satisfactory opacification of the pulmonary arteries to the segmental level. No evidence of pulmonary embolism. Normal heart size. No pericardial effusion. Coronary artery calcifications are noted. Mediastinum/Nodes: No enlarged mediastinal, hilar, or axillary lymph nodes. Thyroid  gland, trachea, and esophagus demonstrate no significant findings. Lungs/Pleura: No pneumothorax is noted. Large left lower lobe airspace opacity is noted consistent with pneumonia. Mild patchy airspace opacities are noted in right lower lobe is well consistent with pneumonia. Mild right middle lobe opacity is noted concerning for pneumonia or atelectasis. Upper Abdomen: No acute abnormality. Musculoskeletal: No chest wall abnormality. No acute or significant osseous findings. Review of the MIP images confirms the above findings. IMPRESSION: No definite evidence of pulmonary embolus. Large left lower lobe airspace opacity is noted consistent with pneumonia. Mild patchy airspace opacities are noted in right lower lobe also concerning for pneumonia. Mild right middle lobe subsegmental atelectasis or pneumonia. Coronary artery calcifications are noted suggesting coronary disease. Aortic Atherosclerosis (ICD10-I70.0). Electronically Signed   By: Rosalene Colon M.D.   On: 08/31/2023 14:31   DG Chest Port 1 View Result Date: 08/31/2023 CLINICAL DATA:  Questionable  sepsis - evaluate for abnormality EXAM: PORTABLE CHEST - 1 VIEW COMPARISON:  April 30, 2017 FINDINGS: Hazy airspace opacities in the left mid and lower lung zones with minimal hazy airspace opacities in the medial right lung base. Blunting of the left costophrenic sulcus. No pneumothorax. No cardiomegaly. Tortuous aorta with aortic atherosclerosis. No acute fracture or destructive lesions. Multilevel thoracic osteophytosis. Cervical fusion hardware. Bilateral glenohumeral joint arthroplasties. IMPRESSION: Hazy airspace disease in the left mid and lower lung zones and the right medial lung base,  worrisome for multifocal pneumonia. A left pleural effusion is also likely present. Electronically Signed   By: Rance Burrows M.D.   On: 08/31/2023 12:35   CT Head Wo Contrast Result Date: 08/31/2023 CLINICAL DATA:  Altered mental status.  Episodes of falling. EXAM: CT HEAD WITHOUT CONTRAST TECHNIQUE: Contiguous axial images were obtained from the base of the skull through the vertex without intravenous contrast. RADIATION DOSE REDUCTION: This exam was performed according to the departmental dose-optimization program which includes automated exposure control, adjustment of the mA and/or kV according to patient size and/or use of iterative reconstruction technique. COMPARISON:  07/15/2008 FINDINGS: Brain: Mild age related volume loss. No evidence of old or acute focal infarction, mass lesion, hemorrhage, hydrocephalus or extra-axial collection. Vascular: No abnormal vascular finding. Skull: Normal Sinuses/Orbits: Clear/normal Other: None IMPRESSION: No acute or traumatic finding. Mild age related volume loss. Electronically Signed   By: Bettylou Brunner M.D.   On: 08/31/2023 12:20    Assessment and Plan: 87M h/o HTN/HLD, and multiple back surgeries p/w LLL CAP.  #LLL CAP -IV CTX 2g x5 doses for CAP -PO azithromycin 500mg  x2 doses to complete 3 dose CAP coverage -Duonebs q6h sch for now -Wean O2 as  tolerated -Ambulatory pulse ox prior to d/c  #GLF #Physical deconditioning Pt with multiple falls and notable on multiple sedating medications -HOLD pta Elavil  150mg  at bedtime, flexeril  10mg  prn, oxycodone  prn. -PT consulted; recs: pending  #H/o HTN -PTA metoprolol  and amlodipine  -HOLD pta Benicar and hydrochlorothiazide; resume prn   Advance Care Planning:   Code Status: Full Code   Consults: PT  Family Communication: Nephew updated at bedside  Severity of Illness: The appropriate patient status for this patient is INPATIENT. Inpatient status is judged to be reasonable and necessary in order to provide the required intensity of service to ensure the patient's safety. The patient's presenting symptoms, physical exam findings, and initial radiographic and laboratory data in the context of their chronic comorbidities is felt to place them at high risk for further clinical deterioration. Furthermore, it is not anticipated that the patient will be medically stable for discharge from the hospital within 2 midnights of admission.   * I certify that at the point of admission it is my clinical judgment that the patient will require inpatient hospital care spanning beyond 2 midnights from the point of admission due to high intensity of service, high risk for further deterioration and high frequency of surveillance required.*   ------- I spent 55 minutes reviewing previous labs/notes, obtaining separate history at the bedside, counseling/discussing the treatment plan outlined above, ordering medications/tests, and performing clinical documentation.  Author: Arne Langdon, MD 08/31/2023 4:58 PM  For on call review [email or web address].

## 2023-08-31 NOTE — ED Provider Notes (Signed)
 [place] EMERGENCY DEPARTMENT AT [facility] Provider Note   CSN: [id number] Arrival date & time: 08/31/23  1116     History  Chief Complaint  Patient presents with   Weakness    Dustin Carter is a 68 y.o. male.  HPI     68 year old male comes in with chief complaint of generalized weakness.  Patient has history of hypertension and hyperlipidemia.  Substantial portion of the history provided by EMS.  According to the EMS, patient's neighbor found him on the floor of his house, and called EMS.  When they arrived, patient was confused.  Patient was found to have low O2 sats and hypotension.  Patient states that he had worked in his yard yesterday.  He started having back pain that was excruciating.  He went to bed at 10 PM.  He woke up this morning and was feeling dizzy and weak.  Patient fell multiple times in his house.  He does not recall working outside in his yard today, however per EMS, the neighbors indicated that patient was earlier outside in his yard doing some work.  Patient complains of dizziness with standing up.  He also states that he has had some cough, and there was bloody phlegm in it.  His back pain is in the lower back area.  He has no chest pain.  He denies any chest pain with deep inspiration or cough, but does indicate feeling short of breath.  Patient does not have any history of PE, DVT.  He denies any heavy smoking.   Home Medications Prior to Admission medications   Medication Sig Start Date End Date Taking? Authorizing Provider  amitriptyline  (ELAVIL ) 150 MG tablet Take 150 mg by mouth at bedtime.   Yes [provider]  amLODipine  (NORVASC ) 10 MG tablet Take 10 mg by mouth daily. 06/14/23  Yes [provider]  aspirin  81 MG chewable tablet Chew 81 mg by mouth daily. 07/28/22  Yes [provider]  atorvastatin  (LIPITOR) 40 MG tablet Take 40 mg by mouth at bedtime.   Yes [provider]  cyclobenzaprine   (FLEXERIL ) 10 MG tablet Take 1 tablet (10 mg total) by mouth 3 (three) times daily as needed for muscle spasms. 12/20/19  Yes Shuford, Sherrlyn Dolores, PA-C  gabapentin  (NEURONTIN ) 300 MG capsule Take 300-600 mg by mouth See admin instructions. Take 300 mg by mouth in the morning and afternoon and 600 mg at night   Yes [provider]  hydrochlorothiazide (HYDRODIURIL) 12.5 MG tablet Take 12.5 mg by mouth daily.   Yes [provider]  losartan  (COZAAR ) 100 MG tablet Take 100 mg by mouth daily.   Yes [provider]  metoprolol  succinate (TOPROL -XL) 100 MG 24 hr tablet TAKE 1 TABLET BY MOUTH EVERY MORNING WITH OR IMMEDIATELY FOLLOWING A MEAL Patient taking differently: Take 100 mg by mouth daily. 07/11/17  Yes [name], MD  olmesartan-hydrochlorothiazide (BENICAR HCT) 40-25 MG tablet Take 1 tablet by mouth daily. 06/28/23  Yes [provider]  oxyCODONE -acetaminophen  (PERCOCET) 7.5-325 MG tablet Take 1 tablet by mouth every 6 (six) hours as needed for moderate pain.  06/15/19  Yes [provider]  pantoprazole  (PROTONIX ) 40 MG tablet Take 1 tablet (40 mg total) by mouth 2 (two) times daily before a meal. Patient taking differently: Take 40 mg by mouth daily. 01/28/19  Yes Rizwan, Saima, MD      Allergies    Butrans [buprenorphine] and Other    Review of Systems  Review of Systems  All other systems reviewed and are negative.   Physical Exam Updated Vital Signs BP 113/68   Pulse 91   Temp 98.9 F (37.2 C)   Resp (!) 27   Ht 5\' 9"  (1.753 m)   Wt 70.3 kg   SpO2 97%   BMI 22.89 kg/m  Physical Exam Vitals and nursing note reviewed.  Constitutional:      Appearance: He is well-developed.  HENT:     Head: Atraumatic.  Eyes:     Extraocular Movements: Extraocular movements intact.     Pupils: Pupils are equal, round, and reactive to light.  Cardiovascular:     Rate and Rhythm: Normal rate.  Pulmonary:     Effort: Pulmonary effort is normal.      Breath sounds: No wheezing.  Abdominal:     Tenderness: There is no abdominal tenderness.  Musculoskeletal:     Cervical back: Neck supple.  Skin:    General: Skin is warm.  Neurological:     Mental Status: He is alert and oriented to person, place, and time.     Cranial Nerves: No cranial nerve deficit.     Sensory: No sensory deficit.     Motor: No weakness.     Comments: Patient found to have subtle ataxia with left finger     ED Results / Procedures / Treatments   Labs (all labs ordered are listed, but only abnormal results are displayed) Labs Reviewed  COMPREHENSIVE METABOLIC PANEL WITH GFR - Abnormal; Notable for the following components:      Result Value   Sodium 132 (*)    Potassium 3.3 (*)    Chloride 95 (*)    Creatinine, Ser 1.61 (*)    Total Protein 5.5 (*)    Albumin 3.2 (*)    GFR, Estimated 46 (*)    All other components within normal limits  CBC WITH DIFFERENTIAL/PLATELET - Abnormal; Notable for the following components:   RBC 4.19 (*)    HCT 38.5 (*)    Neutro Abs 7.8 (*)    Lymphs Abs 0.5 (*)    All other components within normal limits  I-STAT CG4 LACTIC ACID, ED - Abnormal; Notable for the following components:   Lactic Acid, Venous 3.2 (*)    All other components within normal limits  I-STAT VENOUS BLOOD GAS, ED - Abnormal; Notable for the following components:   pCO2, Ven 43.6 (*)    pO2, Ven 47 (*)    Sodium 131 (*)    Potassium 3.3 (*)    Calcium , Ion 1.08 (*)    HCT 38.0 (*)    Hemoglobin 12.9 (*)    All other components within normal limits  I-STAT CHEM 8, ED - Abnormal; Notable for the following components:   Sodium 131 (*)    Potassium 3.4 (*)    Chloride 93 (*)    Creatinine, Ser 1.60 (*)    Calcium , Ion 1.09 (*)    All other components within normal limits  I-STAT CG4 LACTIC ACID, ED - Abnormal; Notable for the following components:   Lactic Acid, Venous 3.3 (*)    All other components within normal limits  CULTURE, BLOOD  (ROUTINE X 2)  CULTURE, BLOOD (ROUTINE X 2)  RESP PANEL BY RT-PCR (RSV, FLU A&B, COVID)  [id number]  PROTIME-INR  URINALYSIS, W/ REFLEX TO CULTURE (INFECTION SUSPECTED)  POC OCCULT BLOOD, ED  I-STAT CG4 LACTIC ACID, ED  TYPE AND SCREEN  TROPONIN I (HIGH  SENSITIVITY)  TROPONIN I (HIGH SENSITIVITY)    EKG EKG Interpretation Date/Time:  Wednesday Aug 31 2023 11:23:50 EDT Ventricular Rate:  101 PR Interval:  166 QRS Duration:  89 QT Interval:  331 QTC Calculation: 429 R Axis:   71  Text Interpretation: Sinus tachycardia No acute changes No significant change since last tracing Confirmed by Deatra Face [phone]) on 08/31/2023 11:56:10 AM  Radiology CT ABDOMEN PELVIS W CONTRAST Result Date: 08/31/2023 CLINICAL DATA:  Low back pain EXAM: CT ABDOMEN AND PELVIS WITH CONTRAST TECHNIQUE: Multidetector CT imaging of the abdomen and pelvis was performed using the standard protocol following bolus administration of intravenous contrast. RADIATION DOSE REDUCTION: This exam was performed according to the departmental dose-optimization program which includes automated exposure control, adjustment of the mA and/or kV according to patient size and/or use of iterative reconstruction technique. CONTRAST:  OMNIPAQUE  IOHEXOL  350 MG/ML SOLN COMPARISON:  CTA chest same day FINDINGS: Lower chest: Dense pneumonia within the LEFT lower lobe. Patchy airspace disease in the RIGHT lower lobe. Hepatobiliary: No focal hepatic lesion. Postcholecystectomy. No biliary dilatation. Pancreas: Pancreas is normal. No ductal dilatation. No pancreatic inflammation. Spleen: Normal spleen Adrenals/urinary tract: Adrenal glands normal. Nonobstructing calculus measuring 6 mm in the mid LEFT kidney. Ureters and bladder normal. Stomach/Bowel: The stomach, duodenum, and small bowel normal. Moderate volume stool throughout the colon. Vascular/Lymphatic: Abdominal aorta is normal caliber with atherosclerotic calcification. There is no  retroperitoneal or periportal lymphadenopathy. No pelvic lymphadenopathy. Reproductive: Prostate unremarkable Other: No free fluid. Musculoskeletal: No aggressive osseous lesion. Posterior lumbar fusion. IMPRESSION: 1. Dense pneumonia in the LEFT lower lobe. 2. Moderate volume stool throughout the colon. Recommend correlation with constipation. 3. Nonobstructing LEFT renal calculus. 4.  Aortic Atherosclerosis (ICD10-I70.0). Electronically Signed   By: Deboraha Fallow M.D.   On: 08/31/2023 14:32   CT Angio Chest PE W and/or Wo Contrast Result Date: 08/31/2023 CLINICAL DATA:  Chest pain.  Hemoptysis. EXAM: CT ANGIOGRAPHY CHEST WITH CONTRAST TECHNIQUE: Multidetector CT imaging of the chest was performed using the standard protocol during bolus administration of intravenous contrast. Multiplanar CT image reconstructions and MIPs were obtained to evaluate the vascular anatomy. RADIATION DOSE REDUCTION: This exam was performed according to the departmental dose-optimization program which includes automated exposure control, adjustment of the mA and/or kV according to patient size and/or use of iterative reconstruction technique. CONTRAST:  OMNIPAQUE  IOHEXOL  350 MG/ML SOLN COMPARISON:  July 14, 2022. FINDINGS: Cardiovascular: Satisfactory opacification of the pulmonary arteries to the segmental level. No evidence of pulmonary embolism. Normal heart size. No pericardial effusion. Coronary artery calcifications are noted. Mediastinum/Nodes: No enlarged mediastinal, hilar, or axillary lymph nodes. Thyroid  gland, trachea, and esophagus demonstrate no significant findings. Lungs/Pleura: No pneumothorax is noted. Large left lower lobe airspace opacity is noted consistent with pneumonia. Mild patchy airspace opacities are noted in right lower lobe is well consistent with pneumonia. Mild right middle lobe opacity is noted concerning for pneumonia or atelectasis. Upper Abdomen: No acute abnormality. Musculoskeletal: No  chest wall abnormality. No acute or significant osseous findings. Review of the MIP images confirms the above findings. IMPRESSION: No definite evidence of pulmonary embolus. Large left lower lobe airspace opacity is noted consistent with pneumonia. Mild patchy airspace opacities are noted in right lower lobe also concerning for pneumonia. Mild right middle lobe subsegmental atelectasis or pneumonia. Coronary artery calcifications are noted suggesting coronary disease. Aortic Atherosclerosis (ICD10-I70.0). Electronically Signed   By: Rosalene Colon M.D.   On: 08/31/2023 14:31   DG  Chest Port 1 View Result Date: 08/31/2023 CLINICAL DATA:  Questionable sepsis - evaluate for abnormality EXAM: PORTABLE CHEST - 1 VIEW COMPARISON:  April 30, 2017 FINDINGS: Hazy airspace opacities in the left mid and lower lung zones with minimal hazy airspace opacities in the medial right lung base. Blunting of the left costophrenic sulcus. No pneumothorax. No cardiomegaly. Tortuous aorta with aortic atherosclerosis. No acute fracture or destructive lesions. Multilevel thoracic osteophytosis. Cervical fusion hardware. Bilateral glenohumeral joint arthroplasties. IMPRESSION: Hazy airspace disease in the left mid and lower lung zones and the right medial lung base, worrisome for multifocal pneumonia. A left pleural effusion is also likely present. Electronically Signed   By: Rance Burrows M.D.   On: 08/31/2023 12:35   CT Head Wo Contrast Result Date: 08/31/2023 CLINICAL DATA:  Altered mental status.  Episodes of falling. EXAM: CT HEAD WITHOUT CONTRAST TECHNIQUE: Contiguous axial images were obtained from the base of the skull through the vertex without intravenous contrast. RADIATION DOSE REDUCTION: This exam was performed according to the departmental dose-optimization program which includes automated exposure control, adjustment of the mA and/or kV according to patient size and/or use of iterative reconstruction technique.  COMPARISON:  07/15/2008 FINDINGS: Brain: Mild age related volume loss. No evidence of old or acute focal infarction, mass lesion, hemorrhage, hydrocephalus or extra-axial collection. Vascular: No abnormal vascular finding. Skull: Normal Sinuses/Orbits: Clear/normal Other: None IMPRESSION: No acute or traumatic finding. Mild age related volume loss. Electronically Signed   By: Bettylou Brunner M.D.   On: 08/31/2023 12:20    Procedures Ultrasound ED Echo  Date/Time: 08/31/2023 1:36 PM  Performed by: Deatra Face, MD Authorized by: Deatra Face, MD   Procedure details:    Indications: hypotension     Views: subxiphoid, parasternal long axis view and parasternal short axis view     Images: archived   Findings:    Pericardium: no pericardial effusion     LV Function: normal (>50% EF)   Impression:    Impression: normal       Medications Ordered in ED Medications  azithromycin (ZITHROMAX) 500 mg in sodium chloride  0.9 % 250 mL IVPB (500 mg Intravenous New Bag/Given 08/31/23 1355)  lactated ringers  infusion (has no administration in time range)  sodium chloride  0.9 % bolus 1,000 mL (0 mLs Intravenous Stopped 08/31/23 1251)  sodium chloride  0.9 % bolus 1,000 mL (0 mLs Intravenous Stopped 08/31/23 1246)  lactated ringers  bolus 1,000 mL (1,000 mLs Intravenous New Bag/Given 08/31/23 1250)  iohexol  (OMNIPAQUE ) 350 MG/ML injection 100 mL (100 mLs Intravenous Contrast Given 08/31/23 1325)  cefTRIAXone (ROCEPHIN) 2 g in sodium chloride  0.9 % 100 mL IVPB (2 g Intravenous New Bag/Given 08/31/23 1353)    ED Course/ Medical Decision Making/ A&P                                 Medical Decision Making Amount and/or Complexity of Data Reviewed Labs: ordered. Radiology: ordered.  Risk Prescription drug management. Decision regarding hospitalization.  This patient presents to the ED with chief complaint(s) of weakness, multiple falls, dizziness with pertinent past medical history of hypertension,  hyperlipidemia.  Patient has history of admission for anemia.he comes to the ER with hypotension, hypoxia.The complaint involves an extensive differential diagnosis and also carries with it a high risk of complications and morbidity.    The differential diagnosis includes : Orthostatic dizziness, stroke, posterior circulation insufficiency, head bleed due to trauma, pulmonary embolism,  questionable pneumonia, pleural effusion, malignancy, severe anemia, AAA, dissection.  The initial plan is to get basic labs.  Patient had no melena. I performed a bedside ultrasound/echo.  Patient has no large pleural effusion, no enlarged RV.  It does not appear that patient has AAA.  We will give fluid bolus.  Additional history obtained: Additional history obtained from EMS  Records reviewed previous admission documents  Independent labs interpretation:  The following labs were independently interpreted: Patient's hemoglobin is stable.  Lactic acid is over 3.  Creatinine is overall reassuring.  Troponin is normal.  Independent visualization and interpretation of imaging: - I independently visualized the following imaging with scope of interpretation limited to determining acute life threatening conditions related to emergency care: CT PE, CT head, which revealed no evidence of saddle pulmonary embolus.  It does appear that there is left-sided multifocal pneumonia.  Patient has no brain bleed.  Treatment and Reassessment: Patient reassessed.  BP improved to hydration.  Patient has a total of 3 L of IV fluid ordered.  We will initiate antibiotics if there is no PE.  Reassessment: I have independently interpreted CT PE.  No evidence of large PE.  We will give patient antibiotics.  Code sepsis will be activated at 1:40 PM.  Reassessment: Patient's O2 sats dropped to 85% on room air.  He has been placed on 2 L of O2 per nasal cannula.  Results of the ED workup discussed with him.  He stable for admission at  this time.  Final Clinical Impression(s) / ED Diagnoses Final diagnoses:  Acute hypoxic respiratory failure (HCC)  Severe sepsis (HCC)  Community acquired pneumonia, unspecified laterality    Rx / DC Orders ED Discharge Orders     None         Deatra Face, MD 08/31/23 1445

## 2023-09-01 ENCOUNTER — Other Ambulatory Visit: Payer: Self-pay

## 2023-09-01 DIAGNOSIS — E872 Acidosis, unspecified: Secondary | ICD-10-CM

## 2023-09-01 DIAGNOSIS — E876 Hypokalemia: Secondary | ICD-10-CM | POA: Diagnosis not present

## 2023-09-01 DIAGNOSIS — J189 Pneumonia, unspecified organism: Secondary | ICD-10-CM | POA: Diagnosis not present

## 2023-09-01 DIAGNOSIS — A419 Sepsis, unspecified organism: Secondary | ICD-10-CM

## 2023-09-01 DIAGNOSIS — R6520 Severe sepsis without septic shock: Secondary | ICD-10-CM

## 2023-09-01 DIAGNOSIS — E871 Hypo-osmolality and hyponatremia: Secondary | ICD-10-CM

## 2023-09-01 LAB — URINALYSIS, W/ REFLEX TO CULTURE (INFECTION SUSPECTED)
Bacteria, UA: NONE SEEN
Bilirubin Urine: NEGATIVE
Glucose, UA: NEGATIVE mg/dL
Hgb urine dipstick: NEGATIVE
Ketones, ur: 5 mg/dL — AB
Leukocytes,Ua: NEGATIVE
Nitrite: NEGATIVE
Protein, ur: NEGATIVE mg/dL
Specific Gravity, Urine: 1.02 (ref 1.005–1.030)
pH: 5 (ref 5.0–8.0)

## 2023-09-01 MED ORDER — AZITHROMYCIN 250 MG PO TABS
500.0000 mg | ORAL_TABLET | Freq: Every day | ORAL | Status: DC
  Administered 2023-09-01 – 2023-09-03 (×3): 500 mg via ORAL
  Filled 2023-09-01 (×3): qty 2

## 2023-09-01 MED ORDER — GUAIFENESIN ER 600 MG PO TB12
600.0000 mg | ORAL_TABLET | Freq: Two times a day (BID) | ORAL | Status: DC
Start: 2023-09-01 — End: 2023-09-03
  Administered 2023-09-01 – 2023-09-03 (×5): 600 mg via ORAL
  Filled 2023-09-01 (×5): qty 1

## 2023-09-01 MED ORDER — IPRATROPIUM-ALBUTEROL 0.5-2.5 (3) MG/3ML IN SOLN: 3.0000 mL | Freq: Four times a day (QID) | RESPIRATORY_TRACT | Status: DC | PRN

## 2023-09-01 MED ORDER — OXYCODONE-ACETAMINOPHEN 7.5-325 MG PO TABS
1.0000 | ORAL_TABLET | Freq: Four times a day (QID) | ORAL | Status: DC | PRN
  Administered 2023-09-01 – 2023-09-02 (×4): 1 via ORAL
  Filled 2023-09-01 (×4): qty 1

## 2023-09-01 NOTE — Plan of Care (Signed)
  Problem: Education: Goal: Knowledge of General Education information will improve Description: Including pain rating scale, medication(s)/side effects and non-pharmacologic comfort measures Outcome: Progressing   Problem: Clinical Measurements: Goal: Respiratory complications will improve Outcome: Progressing   Problem: Activity: Goal: Risk for activity intolerance will decrease Outcome: Progressing   Problem: Nutrition: Goal: Adequate nutrition will be maintained Outcome: Progressing   Problem: Pain Managment: Goal: General experience of comfort will improve and/or be controlled Outcome: Progressing   Problem: Safety: Goal: Ability to remain free from injury will improve Outcome: Progressing

## 2023-09-01 NOTE — Progress Notes (Signed)
 PROGRESS NOTE    [name]  WUJ:[id number] DOB: Oct 15, 1955 DOA: 08/31/2023 PCP: Imelda Man, MD    Brief Narrative:   Dustin Carter is a 68 y.o. male with past medical history significant for HTN, HLD who presented from home via EMS after being found on the floor by a neighbor in his house.  Upon EMS arrival, patient was confused and was found to have low oxygen saturations as well as hypotension.  Patient reports that he was working in his yard day prior, started having back pain and he went to bed roughly at 10 PM.  Upon awakening in the morning he was feeling dizzy and weak in which he fell multiple times in his house.  In the ED, temperature 98.9 F, HR 112, RR 21, BP 70/38, SpO2 84% on room air was placed on nasal cannula.  WBC 8.8, hemoglobin 13.3, platelet count 166.  Sodium 132, potassium 3.3, chloride 95, CO2 25, glucose 89, BUN 13, creat 1.61.  AST 35, ALT 26, total bilirubin 0.8.  High sensitive troponin 8.  Lactic acid 3.2.  FOBT negative.  Blood cultures x 2 obtained.  Urinalysis unrevealing.  COVID/influenza/RSV PCR negative.  Chest x-ray with hazy airspace disease left mid and lower lung zones and medial right base concerning for multifocal pneumonia, left pleural effusion.  CT head without contrast with no acute or traumatic finding, mild age-related volume loss.  CT angiogram chest with large left lower lobe airspace opacity consistent with pneumonia, mild patchy airspace opacities right lower lobe consistent with pneumonia, right middle lobe subsegmental atelectasis versus pneumonia.  CT abdomen/pelvis with contrast with dense pneumonia left lower lobe, moderate volume stool throughout colon, nonobstructing left renal calculus.  Patient was given 3 L IVF bolus in the ED, started on IV ceftriaxone and azithromycin.  TRH consulted for admission for further evaluation management of sepsis secondary to community-acquired pneumonia, lactic acidosis, acute renal failure.  Assessment &  Plan:   Severe sepsis, POA Community-acquired/multifocal pneumonia Lactic acidosis: resolved Patient presenting to ED after being found by neighbors on the floor of his home poorly responsive.  Patient reportedly feeling dizzy and faint with multiple falls same day.  On EMS arrival he was noted to be hypoxic, hypotensive.  Patient is afebrile with BP was 70/38, SpO2 84%, tachypneic and tachycardic with elevated lactic acid of 3.2.  Imaging consistent with severe multifocal pneumonia. -- WBC 8.8>12.3 -- Azithromycin 500 mg PO daily x 5 days -- Ceftriaxone 2 g IV every 24 hours x 5 days -- Mucinex  600 mg p.o. twice daily -- DuoNebs 3 times daily -- Continue supplemental oxygen, maintain SpO2 greater than 92%; currently on 2 L nasal cannula -- O2 screen today by PT; with continued desaturation to 81% on room air with ambulation, he was requiring O2 -- Repeat CBC in a.m.  Hyponatremia Patient presenting with a sodium of 132.  Etiology likely second to hypovolemic, natremia in the setting of sepsis, pneumonia as above. -- Continue to encourage increased oral intake -- BMP in a.m.  Hypokalemia Potassium 3.3 on admission. -- Repeat BMP in a.m. with magnesium  level  Hypotension Hx Essential hypertension Patient was notably hypotensive on ED arrival, BP 70/38.  Improved with aggressive IV fluid resuscitation with later liters IVF.  Home antihypertensive regimen includes olmesartan-HCTZ 40-25 mg p.o. daily, amlodipine  10 mg p.o. daily.   -- BP improved to 124/62 this morning -- Continue metoprolol  succinate 50 mg PO BID -- Continue to hold home olmesartan-HCTZ, amlodipine  --  Monitor BP closely  HLD -- Atorvastatin  40 g p.o. daily  History of chronic low back pain -- Continue home oxycodone  7.5-325mg  PO q6h PRN moderate pain  Weakness/debility/deconditioning: Multiple falls: --PT/OT evaluation -- Fall precautions   DVT prophylaxis: enoxaparin  (LOVENOX ) injection 40 mg Start: 08/31/23  1700    Code Status: Full Code Family Communication: No family present at bedside this morning  Disposition Plan:  Level of care: Med-Surg Status is: Inpatient Remains inpatient appropriate because: IV antibiotics, weaning from O2, therapy evaluation    Consultants:  None  Procedures:  None  Antimicrobials:  Azithromycin 5/7>> Ceftriaxone 5/7>>   Subjective: Patient seen examined bedside, lying in bed.  Overall feels much improved this morning.  Remains on oxygen.  Seen by PT with ambulatory O2 screen this morning with continued desaturation down to 81% on room air, stable on 2 L nasal cannula.  Patient requesting resumption of his oxycodone  for his chronic low back pain.  No other specific questions, concerns or complaints at this time.  Denies headache, no fever, no chills, no nausea/vomiting/diarrhea, no chest pain, no productive cough, no palpitations, no abdominal pain, no focal weakness, no fatigue, no paresthesia.  No acute events overnight per nurse staff.  Objective: Vitals:   09/01/23 0025 09/01/23 0440 09/01/23 0734 09/01/23 0834  BP: (!) 105/59 113/65 124/62   Pulse: 84 88 91   Resp: 18 18 16    Temp: 98.2 F (36.8 C) 98 F (36.7 C) 98.4 F (36.9 C)   TempSrc:   Oral   SpO2: 97% 100% 100% 99%  Weight:      Height:        Intake/Output Summary (Last 24 hours) at 09/01/2023 1007 Last data filed at 09/01/2023 0600 Gross per 24 hour  Intake 340 ml  Output --  Net 340 ml   Filed Weights   08/31/23 1127  Weight: 70.3 kg    Examination:  Physical Exam: GEN: NAD, alert and oriented x 3, wd/wn HEENT: NCAT, PERRL, EOMI, sclera clear, MMM PULM: Breath sounds slight diminished bilateral bases with rales, no wheezing, normal respiratory effort with accessory muscle use, on 2 L nasal cannula CV: RRR w/o M/G/R GI: abd soft, NTND, + BS MSK: no peripheral edema, muscle strength globally intact 5/5 bilateral upper/lower extremities NEURO: CN II-XII intact, no focal  deficits, sensation to light touch intact PSYCH: normal mood/affect Integumentary: dry/intact, no rashes or wounds    Data Reviewed: I have personally reviewed following labs and imaging studies  CBC: Recent Labs  Lab 08/31/23 1144 08/31/23 1153 08/31/23 2003  WBC 8.8  --  12.3*  NEUTROABS 7.8*  --   --   HGB 13.3 13.6  12.9* 11.3*  HCT 38.5* 40.0  38.0* 33.3*  MCV 91.9  --  91.7  PLT 166  --  108*   Basic Metabolic Panel: Recent Labs  Lab 08/31/23 1144 08/31/23 1153 08/31/23 2003  NA 132* 131*  131*  --   K 3.3* 3.4*  3.3*  --   CL 95* 93*  --   CO2 25  --   --   GLUCOSE 89 86  --   BUN 13 19  --   CREATININE 1.61* 1.60* 1.04  CALCIUM  8.9  --   --    GFR: Estimated Creatinine Clearance: 66.7 mL/min (by C-G formula based on SCr of 1.04 mg/dL). Liver Function Tests: Recent Labs  Lab 08/31/23 1144  AST 35  ALT 26  ALKPHOS 73  BILITOT 0.8  PROT 5.5*  ALBUMIN 3.2*   No results for input(s): "LIPASE", "AMYLASE" in the last 168 hours. No results for input(s): "AMMONIA" in the last 168 hours. Coagulation Profile: Recent Labs  Lab 08/31/23 1144  INR 1.1   Cardiac Enzymes: No results for input(s): "CKTOTAL", "CKMB", "CKMBINDEX", "TROPONINI" in the last 168 hours. BNP (last 3 results) No results for input(s): "PROBNP" in the last 8760 hours. HbA1C: No results for input(s): "HGBA1C" in the last 72 hours. CBG: No results for input(s): "GLUCAP" in the last 168 hours. Lipid Profile: No results for input(s): "CHOL", "HDL", "LDLCALC", "TRIG", "CHOLHDL", "LDLDIRECT" in the last 72 hours. Thyroid  Function Tests: No results for input(s): "TSH", "T4TOTAL", "FREET4", "T3FREE", "THYROIDAB" in the last 72 hours. Anemia Panel: No results for input(s): "VITAMINB12", "FOLATE", "FERRITIN", "TIBC", "IRON", "RETICCTPCT" in the last 72 hours. Sepsis Labs: Recent Labs  Lab 08/31/23 1157 08/31/23 1321 08/31/23 1622  LATICACIDVEN 3.2* 3.3* 1.4    Recent Results  (from the past 240 hours)  Resp panel by RT-PCR (RSV, Flu A&B, Covid) Anterior Nasal Swab     Status: None   Collection Time: 08/31/23  3:58 PM   Specimen: Anterior Nasal Swab  Result Value Ref Range Status   SARS Coronavirus 2 by RT PCR NEGATIVE NEGATIVE Final   Influenza A by PCR NEGATIVE NEGATIVE Final   Influenza B by PCR NEGATIVE NEGATIVE Final    Comment: (NOTE) The Xpert Xpress SARS-CoV-2/FLU/RSV plus assay is intended as an aid in the diagnosis of influenza from Nasopharyngeal swab specimens and should not be used as a sole basis for treatment. Nasal washings and aspirates are unacceptable for Xpert Xpress SARS-CoV-2/FLU/RSV testing.  Fact Sheet for Patients: BloggerCourse.com  Fact Sheet for Healthcare Providers: SeriousBroker.it  This test is not yet approved or cleared by the [place]  FDA and has been authorized for detection and/or diagnosis of SARS-CoV-2 by FDA under an Emergency Use Authorization (EUA). This EUA will remain in effect (meaning this test can be used) for the duration of the COVID-19 declaration under Section 564(b)(1) of the Act, 21 U.S.C. section 360bbb-3(b)(1), unless the authorization is terminated or revoked.     Resp Syncytial Virus by PCR NEGATIVE NEGATIVE Final    Comment: (NOTE) Fact Sheet for Patients: BloggerCourse.com  Fact Sheet for Healthcare Providers: SeriousBroker.it  This test is not yet approved or cleared by the [place]  FDA and has been authorized for detection and/or diagnosis of SARS-CoV-2 by FDA under an Emergency Use Authorization (EUA). This EUA will remain in effect (meaning this test can be used) for the duration of the COVID-19 declaration under Section 564(b)(1) of the Act, 21 U.S.C. section 360bbb-3(b)(1), unless the authorization is terminated or revoked.  Performed at [facility], [address] [place], [place] [address]          Radiology Studies: CT ABDOMEN PELVIS W CONTRAST Result Date: 08/31/2023 CLINICAL DATA:  Low back pain EXAM: CT ABDOMEN AND PELVIS WITH CONTRAST TECHNIQUE: Multidetector CT imaging of the abdomen and pelvis was performed using the standard protocol following bolus administration of intravenous contrast. RADIATION DOSE REDUCTION: This exam was performed according to the departmental dose-optimization program which includes automated exposure control, adjustment of the mA and/or kV according to patient size and/or use of iterative reconstruction technique. CONTRAST:  OMNIPAQUE  IOHEXOL  350 MG/ML SOLN COMPARISON:  CTA chest same day FINDINGS: Lower chest: Dense pneumonia within the LEFT lower lobe. Patchy airspace disease in the RIGHT lower lobe. Hepatobiliary: No focal  hepatic lesion. Postcholecystectomy. No biliary dilatation. Pancreas: Pancreas is normal. No ductal dilatation. No pancreatic inflammation. Spleen: Normal spleen Adrenals/urinary tract: Adrenal glands normal. Nonobstructing calculus measuring 6 mm in the mid LEFT kidney. Ureters and bladder normal. Stomach/Bowel: The stomach, duodenum, and small bowel normal. Moderate volume stool throughout the colon. Vascular/Lymphatic: Abdominal aorta is normal caliber with atherosclerotic calcification. There is no retroperitoneal or periportal lymphadenopathy. No pelvic lymphadenopathy. Reproductive: Prostate unremarkable Other: No free fluid. Musculoskeletal: No aggressive osseous lesion. Posterior lumbar fusion. IMPRESSION: 1. Dense pneumonia in the LEFT lower lobe. 2. Moderate volume stool throughout the colon. Recommend correlation with constipation. 3. Nonobstructing LEFT renal calculus. 4.  Aortic Atherosclerosis (ICD10-I70.0). Electronically Signed   By: Deboraha Fallow M.D.   On: 08/31/2023 14:32   CT Angio Chest PE W and/or Wo Contrast Result Date: 08/31/2023 CLINICAL DATA:  Chest pain.  Hemoptysis. EXAM:  CT ANGIOGRAPHY CHEST WITH CONTRAST TECHNIQUE: Multidetector CT imaging of the chest was performed using the standard protocol during bolus administration of intravenous contrast. Multiplanar CT image reconstructions and MIPs were obtained to evaluate the vascular anatomy. RADIATION DOSE REDUCTION: This exam was performed according to the departmental dose-optimization program which includes automated exposure control, adjustment of the mA and/or kV according to patient size and/or use of iterative reconstruction technique. CONTRAST:  OMNIPAQUE  IOHEXOL  350 MG/ML SOLN COMPARISON:  July 14, 2022. FINDINGS: Cardiovascular: Satisfactory opacification of the pulmonary arteries to the segmental level. No evidence of pulmonary embolism. Normal heart size. No pericardial effusion. Coronary artery calcifications are noted. Mediastinum/Nodes: No enlarged mediastinal, hilar, or axillary lymph nodes. Thyroid  gland, trachea, and esophagus demonstrate no significant findings. Lungs/Pleura: No pneumothorax is noted. Large left lower lobe airspace opacity is noted consistent with pneumonia. Mild patchy airspace opacities are noted in right lower lobe is well consistent with pneumonia. Mild right middle lobe opacity is noted concerning for pneumonia or atelectasis. Upper Abdomen: No acute abnormality. Musculoskeletal: No chest wall abnormality. No acute or significant osseous findings. Review of the MIP images confirms the above findings. IMPRESSION: No definite evidence of pulmonary embolus. Large left lower lobe airspace opacity is noted consistent with pneumonia. Mild patchy airspace opacities are noted in right lower lobe also concerning for pneumonia. Mild right middle lobe subsegmental atelectasis or pneumonia. Coronary artery calcifications are noted suggesting coronary disease. Aortic Atherosclerosis (ICD10-I70.0). Electronically Signed   By: Rosalene Colon M.D.   On: 08/31/2023 14:31   DG Chest Port 1 View Result  Date: 08/31/2023 CLINICAL DATA:  Questionable sepsis - evaluate for abnormality EXAM: PORTABLE CHEST - 1 VIEW COMPARISON:  April 30, 2017 FINDINGS: Hazy airspace opacities in the left mid and lower lung zones with minimal hazy airspace opacities in the medial right lung base. Blunting of the left costophrenic sulcus. No pneumothorax. No cardiomegaly. Tortuous aorta with aortic atherosclerosis. No acute fracture or destructive lesions. Multilevel thoracic osteophytosis. Cervical fusion hardware. Bilateral glenohumeral joint arthroplasties. IMPRESSION: Hazy airspace disease in the left mid and lower lung zones and the right medial lung base, worrisome for multifocal pneumonia. A left pleural effusion is also likely present. Electronically Signed   By: Rance Burrows M.D.   On: 08/31/2023 12:35   CT Head Wo Contrast Result Date: 08/31/2023 CLINICAL DATA:  Altered mental status.  Episodes of falling. EXAM: CT HEAD WITHOUT CONTRAST TECHNIQUE: Contiguous axial images were obtained from the base of the skull through the vertex without intravenous contrast. RADIATION DOSE REDUCTION: This exam was performed according to the departmental dose-optimization program  which includes automated exposure control, adjustment of the mA and/or kV according to patient size and/or use of iterative reconstruction technique. COMPARISON:  07/15/2008 FINDINGS: Brain: Mild age related volume loss. No evidence of old or acute focal infarction, mass lesion, hemorrhage, hydrocephalus or extra-axial collection. Vascular: No abnormal vascular finding. Skull: Normal Sinuses/Orbits: Clear/normal Other: None IMPRESSION: No acute or traumatic finding. Mild age related volume loss. Electronically Signed   By: Bettylou Brunner M.D.   On: 08/31/2023 12:20        Scheduled Meds:  amLODipine   10 mg Oral Daily   aspirin   81 mg Oral QHS   atorvastatin   40 mg Oral QHS   azithromycin  500 mg Oral Daily   enoxaparin  (LOVENOX ) injection  40 mg  Subcutaneous Q24H   ipratropium-albuterol   3 mL Nebulization TID   metoprolol  succinate  50 mg Oral BID   pantoprazole   40 mg Oral BID AC   Continuous Infusions:  cefTRIAXone (ROCEPHIN)  IV 2 g (09/01/23 0519)     LOS: 1 day    Time spent: 52 minutes spent on 09/01/2023 caring for this patient face-to-face including chart review, ordering labs/tests, documenting, discussion with nursing staff, consultants, updating family and interview/physical exam    Rema Care [place], DO [facility] Available via Epic secure chat 7am-7pm After these hours, please refer to coverage provider listed on [email or web address] 09/01/2023, 10:07 AM

## 2023-09-01 NOTE — Evaluation (Signed)
 Physical Therapy Evaluation Patient Details Name: Dustin Carter MRN: [id number] DOB: 1955-10-17 Today's Date: 09/01/2023  History of Present Illness  68 yo male presents to [facility] on 5/7 with LBP, arm pain, chest pain, multiple falls with + head trauma, hypotension, hypoxia. Workup for LLL CAP. PMH includes HTN, HLD, multiple back surgeries, OA, BPH, polyneuropathy, cervical fusion, lap chole, R TSA, former smoker.  Clinical Impression   Pt presents with impaired activity tolerance, dyspnea on exertion with desat on RA, and impaired balance. Pt to benefit from acute PT to address deficits. Pt ambulated good hallway distance with use of RW and intermittent standing rest breaks, SPO2 drop to 81% on RA so placed on 2LO2 with good recovery to 95%. PT anticipates good progress while acute. PT to progress mobility as tolerated, and will continue to follow acutely.          If plan is discharge home, recommend the following: A little help with walking and/or transfers   Can travel by private vehicle        Equipment Recommendations None recommended by PT  Recommendations for Other Services       Functional Status Assessment Patient has had a recent decline in their functional status and demonstrates the ability to make significant improvements in function in a reasonable and predictable amount of time.     Precautions / Restrictions Precautions Precautions: Fall Restrictions Weight Bearing Restrictions Per Provider Order: No      Mobility  Bed Mobility Overal bed mobility: Needs Assistance Bed Mobility: Supine to Sit, Sit to Supine     Supine to sit: Supervision Sit to supine: Supervision   General bed mobility comments: PT management of lines/leads    Transfers Overall transfer level: Needs assistance Equipment used: Rolling walker (2 wheels) Transfers: Sit to/from Stand Sit to Stand: Supervision           General transfer comment: for safety, no physical assist     Ambulation/Gait Ambulation/Gait assistance: Supervision Gait Distance (Feet): 300 Feet Assistive device: Rolling walker (2 wheels) Gait Pattern/deviations: Step-through pattern, Decreased stride length, Trunk flexed Gait velocity: decr     General Gait Details: for safety, cue for upright posture and hallway navigation as pt drifting R some. SpO2 drop to 81% on RA, placed on 2LO2 for SPO2 95%  Stairs            Wheelchair Mobility     Tilt Bed    Modified Rankin (Stroke Patients Only)       Balance Overall balance assessment: Needs assistance, History of Falls (pt endorses multiple falls, yesterday only) Sitting-balance support: No upper extremity supported Sitting balance-Leahy Scale: Good     Standing balance support: No upper extremity supported, During functional activity Standing balance-Leahy Scale: Fair Standing balance comment: can stand statically unsupported, benefits from RW use dynamically                             Pertinent Vitals/Pain Pain Assessment Pain Assessment: Faces Faces Pain Scale: Hurts a little bit Pain Location: low back, chronic Pain Descriptors / Indicators: Discomfort, Sore Pain Intervention(s): Limited activity within patient's tolerance, Monitored during session, Repositioned    Home Living Family/patient expects to be discharged to:: Private residence Living Arrangements: Alone Available Help at Discharge: Family Type of Home: House Home Access: Stairs to enter Entrance Stairs-Rails: Right Entrance Stairs-Number of Steps: 4 (in front, no rails), 3 (in back, R handrail)   Home  Layout: One level Home Equipment: Cane - single Librarian, academic (2 wheels);BSC/3in1;Shower seat;Wheelchair - manual      Prior Function Prior Level of Function : Independent/Modified Independent             Mobility Comments: uses RW for going outside ADLs Comments: indep     Extremity/Trunk Assessment   Upper Extremity  Assessment Upper Extremity Assessment: Defer to OT evaluation    Lower Extremity Assessment Lower Extremity Assessment: Overall WFL for tasks assessed    Cervical / Trunk Assessment Cervical / Trunk Assessment: Kyphotic;Other exceptions Cervical / Trunk Exceptions: history of cervical and back fusions  Communication   Communication Communication: No apparent difficulties    Cognition Arousal: Alert Behavior During Therapy: WFL for tasks assessed/performed, Impulsive   PT - Cognitive impairments: No apparent impairments                       PT - Cognition Comments: can move quickly, requires cues for safety, suspect this is baseline Following commands: Intact       Cueing Cueing Techniques: Verbal cues     General Comments      Exercises     Assessment/Plan    PT Assessment Patient needs continued PT services  PT Problem List Decreased mobility;Decreased activity tolerance;Decreased balance;Pain;Cardiopulmonary status limiting activity;Decreased knowledge of precautions       PT Treatment Interventions DME instruction;Therapeutic activities;Gait training;Therapeutic exercise;Patient/family education;Balance training;Stair training;Functional mobility training;Neuromuscular re-education    PT Goals (Current goals can be found in the Care Plan section)  Acute Rehab PT Goals Patient Stated Goal: home when able, off O2 PT Goal Formulation: With patient Time For Goal Achievement: 09/15/23 Potential to Achieve Goals: Good    Frequency Min 2X/week     Co-evaluation               AM-PAC PT "6 Clicks" Mobility  Outcome Measure Help needed turning from your back to your side while in a flat bed without using bedrails?: None Help needed moving from lying on your back to sitting on the side of a flat bed without using bedrails?: None Help needed moving to and from a bed to a chair (including a wheelchair)?: None Help needed standing up from a chair  using your arms (e.g., wheelchair or bedside chair)?: None Help needed to walk in hospital room?: A Little Help needed climbing 3-5 steps with a railing? : A Little 6 Click Score: 22    End of Session Equipment Utilized During Treatment: Oxygen Activity Tolerance: Patient tolerated treatment well Patient left: in bed;with call bell/phone within reach;with bed alarm set (no recliner in room, PT searched unit and unsuccessful in finding one) Nurse Communication: Mobility status PT Visit Diagnosis: Other abnormalities of gait and mobility (R26.89);Muscle weakness (generalized) (M62.81)    Time: 1610-9604 PT Time Calculation (min) (ACUTE ONLY): 24 min   Charges:   PT Evaluation $PT Eval Low Complexity: 1 Low   PT General Charges $$ ACUTE PT VISIT: 1 Visit         Shirlene Doughty, PT DPT Acute Rehabilitation Services Secure Chat Preferred  Office [phone]   Dustin Carter 09/01/2023, 10:20 AM

## 2023-09-01 NOTE — Progress Notes (Signed)
 Mobility Specialist Progress Note:    09/01/23 1512  Mobility  Activity Ambulated with assistance in hallway  Level of Assistance Contact guard assist, steadying assist  Assistive Device Front wheel walker  Distance Ambulated (ft) 250 ft  Activity Response Tolerated well  Mobility Referral Yes  Mobility visit 1 Mobility  Mobility Specialist Start Time (ACUTE ONLY) 1430  Mobility Specialist Stop Time (ACUTE ONLY) 1445  Mobility Specialist Time Calculation (min) (ACUTE ONLY) 15 min   Received pt in bed having no complaints and agreeable to mobility. Pt was asymptomatic throughout session. Was able to ambulate on RA, no SOB, once able to get a good pleth on pt SPO2 was 95%. Returned to room w/o fault. Left in bed w/ call bell in reach and all needs met. Family in room.   Inetta Manes Mobility Specialist  Please contact vis Secure Chat or  Rehab Office [phone]

## 2023-09-01 NOTE — Progress Notes (Addendum)
 SATURATION QUALIFICATIONS: (This note is used to comply with regulatory documentation for home oxygen)  Patient Saturations on Room Air at Rest = 92%  Patient Saturations on Room Air while Ambulating = 81%  Patient Saturations on 2 Liters of oxygen while Ambulating = 95%  Please briefly explain why patient needs home oxygen: to maintain SPO2 >88%   Dustin Carter S, PT DPT Acute Arts development officer Preferred  Office [phone]

## 2023-09-01 NOTE — Evaluation (Signed)
 Occupational Therapy Evaluation & Discharge Patient Details Name: Dustin Carter MRN: [id number] DOB: 08-23-1955 Today's Date: 09/01/2023   History of Present Illness   68 yo male presents to [facility] on 5/7 with LBP, arm pain, chest pain, multiple falls with + head trauma, hypotension, hypoxia. Workup for LLL CAP. PMH includes HTN, HLD, multiple back surgeries, OA, BPH, polyneuropathy, cervical fusion, lap chole, R TSA, former smoker.     Clinical Impressions Pt admitted based on above, and was seen based on problem list below. PTA pt was independent with ADLs and IADLs including driving. Today pt is presenting at or near his functional baseline. Able to ambulate good distance in hallway with s for safety with use of RW. Tub transfer simulated in session with s for safety. Pt able to maintain O2 stats throughout the session on RA. OT educated pt on safe transfers, use of current DME, and use of RW in home. No follow up OT needs.  No further acute OT needs identified, OT is signing off on this pt.        If plan is discharge home, recommend the following:         Functional Status Assessment   Patient has not had a recent decline in their functional status     Equipment Recommendations   None recommended by OT      Precautions/Restrictions   Precautions Precautions: Fall Recall of Precautions/Restrictions: Intact Restrictions Weight Bearing Restrictions Per Provider Order: No     Mobility Bed Mobility Overal bed mobility: Modified Independent             General bed mobility comments: No assist    Transfers Overall transfer level: Needs assistance Equipment used: Rolling walker (2 wheels) Transfers: Sit to/from Stand Sit to Stand: Supervision         General transfer comment: S for safety with use of RW, cues for safety with speed and turns      Balance Overall balance assessment: Needs assistance, History of Falls (pt endorses multiple falls,  yesterday only) Sitting-balance support: No upper extremity supported Sitting balance-Leahy Scale: Good     Standing balance support: Bilateral upper extremity supported, During functional activity Standing balance-Leahy Scale: Fair Standing balance comment: can stand statically unsupported, benefits from RW use dynamically     ADL either performed or assessed with clinical judgement   ADL Overall ADL's : At baseline             Vision Baseline Vision/History: 1 Wears glasses Vision Assessment?: No apparent visual deficits            Pertinent Vitals/Pain Pain Assessment Pain Assessment: Faces Faces Pain Scale: Hurts a little bit Pain Location: low back, chronic Pain Descriptors / Indicators: Discomfort, Sore Pain Intervention(s): Monitored during session     Extremity/Trunk Assessment Upper Extremity Assessment Upper Extremity Assessment: Overall WFL for tasks assessed;Right hand dominant   Lower Extremity Assessment Lower Extremity Assessment: Defer to PT evaluation   Cervical / Trunk Assessment Cervical / Trunk Assessment: Kyphotic;Other exceptions Cervical / Trunk Exceptions: history of cervical and back fusions   Communication Communication Communication: No apparent difficulties   Cognition Arousal: Alert Behavior During Therapy: WFL for tasks assessed/performed, Impulsive       Following commands: Intact       Cueing  General Comments   Cueing Techniques: Verbal cues  VSS on RA           Home Living Family/patient expects to be discharged to:: Private residence  Living Arrangements: Alone Available Help at Discharge: Family Type of Home: House Home Access: Stairs to enter [employer] of Steps: 4 (in front, no rails), 3 (in back, R handrail) Entrance Stairs-Rails: Right Home Layout: One level     Bathroom Shower/Tub: Chief Strategy Officer: Standard     Home Equipment: Cane - single Librarian, academic (2  wheels);BSC/3in1;Shower seat;Wheelchair - manual          Prior Functioning/Environment Prior Level of Function : Independent/Modified Independent             Mobility Comments: uses RW for going outside ADLs Comments: indep    OT Problem List: Decreased activity tolerance   OT Treatment/Interventions:        OT Goals(Current goals can be found in the care plan section)   Acute Rehab OT Goals Patient Stated Goal: To go home OT Goal Formulation: With patient Time For Goal Achievement: 09/15/23 Potential to Achieve Goals: Good   AM-PAC OT "6 Clicks" Daily Activity     Outcome Measure Help from another person eating meals?: None Help from another person taking care of personal grooming?: None Help from another person toileting, which includes using toliet, bedpan, or urinal?: None Help from another person bathing (including washing, rinsing, drying)?: None Help from another person to put on and taking off regular upper body clothing?: None Help from another person to put on and taking off regular lower body clothing?: None 6 Click Score: 24   End of Session Equipment Utilized During Treatment: Rolling walker (2 wheels) Nurse Communication: Mobility status  Activity Tolerance: Patient tolerated treatment well Patient left: in bed;with call bell/phone within reach;with bed alarm set  OT Visit Diagnosis: Repeated falls (R29.6)                Time: 1191-4782 OT Time Calculation (min): 11 min Charges:  OT General Charges $OT Visit: 1 Visit OT Evaluation $OT Eval Low Complexity: 1 Low  Delmer Ferraris, OT  Acute Rehabilitation Services Office [phone] Secure chat preferred   Mickael Alamo 09/01/2023, 5:04 PM

## 2023-09-01 NOTE — Progress Notes (Signed)
   09/01/23 1344  TOC Brief Assessment  Insurance and Status Reviewed  Patient has primary care physician Yes  Prior level of function: independent  Prior/Current Home Services No current home services  Social Drivers of Health Review SDOH reviewed no interventions necessary  Readmission risk has been reviewed Yes   In progression anticipated discharge date 09/02/23. Patient does not have home oxygen. Per ambulatory oxygen saturation note patient qualifies for home oxygen.   Await order for home oxygen or updated ambulatory oxygen saturation note on day of discharge.     Transition of Care Department [facility]) has reviewed patient and  will continue to monitor patient advancement through interdisciplinary progression rounds. If new patient transition needs arise, please place a TOC consult.

## 2023-09-02 DIAGNOSIS — J189 Pneumonia, unspecified organism: Secondary | ICD-10-CM | POA: Diagnosis not present

## 2023-09-02 LAB — CBC
HCT: 31.1 % — ABNORMAL LOW (ref 39.0–52.0)
Hemoglobin: 10.6 g/dL — ABNORMAL LOW (ref 13.0–17.0)
MCH: 31.4 pg (ref 26.0–34.0)
MCHC: 34.1 g/dL (ref 30.0–36.0)
MCV: 92 fL (ref 80.0–100.0)
Platelets: 101 10*3/uL — ABNORMAL LOW (ref 150–400)
RBC: 3.38 MIL/uL — ABNORMAL LOW (ref 4.22–5.81)
RDW: 13 % (ref 11.5–15.5)
WBC: 16.3 10*3/uL — ABNORMAL HIGH (ref 4.0–10.5)
nRBC: 0 % (ref 0.0–0.2)

## 2023-09-02 LAB — BASIC METABOLIC PANEL WITH GFR
Anion gap: 10 (ref 5–15)
BUN: 9 mg/dL (ref 8–23)
CO2: 25 mmol/L (ref 22–32)
Calcium: 8.2 mg/dL — ABNORMAL LOW (ref 8.9–10.3)
Chloride: 96 mmol/L — ABNORMAL LOW (ref 98–111)
Creatinine, Ser: 0.64 mg/dL (ref 0.61–1.24)
GFR, Estimated: >60 mL/min (ref >60)
Glucose, Bld: 84 mg/dL (ref 70–99)
Potassium: 3.7 mmol/L (ref 3.5–5.1)
Sodium: 131 mmol/L — ABNORMAL LOW (ref 135–145)

## 2023-09-02 LAB — MAGNESIUM: Magnesium: 1.2 mg/dL — ABNORMAL LOW (ref 1.7–2.4)

## 2023-09-02 MED ORDER — MAGNESIUM SULFATE 4 GM/100ML IV SOLN
4.0000 g | Freq: Once | INTRAVENOUS | Status: AC
  Administered 2023-09-02: 4 g via INTRAVENOUS
  Filled 2023-09-02: qty 100

## 2023-09-02 MED ORDER — POTASSIUM CHLORIDE CRYS ER 20 MEQ PO TBCR
30.0000 meq | EXTENDED_RELEASE_TABLET | Freq: Once | ORAL | Status: AC
  Administered 2023-09-02: 30 meq via ORAL
  Filled 2023-09-02: qty 1

## 2023-09-02 NOTE — Progress Notes (Signed)
 Mobility Specialist Progress Note:    09/02/23 1152  Mobility  Activity Ambulated with assistance in hallway  Level of Assistance Contact guard assist, steadying assist  Assistive Device Front wheel walker  Distance Ambulated (ft) 550 ft  Activity Response Tolerated well  Mobility Referral Yes  Mobility visit 1 Mobility  Mobility Specialist Start Time (ACUTE ONLY) 1105  Mobility Specialist Stop Time (ACUTE ONLY) 1120  Mobility Specialist Time Calculation (min) (ACUTE ONLY) 15 min   Received pt in bed having no complaints and agreeable to mobility. Pt was asymptomatic throughout session. Ambulated on RA, VSS. Returned to room w/o fault. Left in bed w/ call bell in reach and all needs met.   Inetta Manes Mobility Specialist  Please contact vis Secure Chat or  Rehab Office [phone]

## 2023-09-02 NOTE — Care Management Important Message (Signed)
 Important Message  Patient Details  Name: Dustin Carter MRN: [id number] Date of Birth: Feb 02, 1956   Important Message Given:  Yes - Medicare IM     [name] 09/02/2023, 2:26 PM

## 2023-09-02 NOTE — Plan of Care (Signed)
  Problem: Health Behavior/Discharge Planning: Goal: Ability to manage health-related needs will improve Outcome: Progressing   Problem: Clinical Measurements: Goal: Will remain free from infection Outcome: Progressing   Problem: Clinical Measurements: Goal: Respiratory complications will improve Outcome: Progressing   Problem: Activity: Goal: Risk for activity intolerance will decrease Outcome: Progressing   Problem: Coping: Goal: Level of anxiety will decrease Outcome: Progressing   Problem: Elimination: Goal: Will not experience complications related to urinary retention Outcome: Progressing   Problem: Elimination: Goal: Will not experience complications related to bowel motility Outcome: Progressing   Problem: Skin Integrity: Goal: Risk for impaired skin integrity will decrease Outcome: Progressing   Problem: Safety: Goal: Ability to remain free from injury will improve Outcome: Progressing

## 2023-09-02 NOTE — Progress Notes (Signed)
 PROGRESS NOTE    Dustin Carter  WUJ:[id number] DOB: February 21, 1956 DOA: 08/31/2023 PCP: Imelda Man, MD    Brief Narrative:   Dustin Carter is a 68 y.o. male with past medical history significant for HTN, HLD who presented from home via EMS after being found on the floor by a neighbor in his house.  Upon EMS arrival, patient was confused and was found to have low oxygen saturations as well as hypotension.  Patient reports that he was working in his yard day prior, started having back pain and he went to bed roughly at 10 PM.  Upon awakening in the morning he was feeling dizzy and weak in which he fell multiple times in his house.  In the ED, temperature 98.9 F, HR 112, RR 21, BP 70/38, SpO2 84% on room air was placed on nasal cannula.  WBC 8.8, hemoglobin 13.3, platelet count 166.  Sodium 132, potassium 3.3, chloride 95, CO2 25, glucose 89, BUN 13, creat 1.61.  AST 35, ALT 26, total bilirubin 0.8.  High sensitive troponin 8.  Lactic acid 3.2.  FOBT negative.  Blood cultures x 2 obtained.  Urinalysis unrevealing.  COVID/influenza/RSV PCR negative.  Chest x-ray with hazy airspace disease left mid and lower lung zones and medial right base concerning for multifocal pneumonia, left pleural effusion.  CT head without contrast with no acute or traumatic finding, mild age-related volume loss.  CT angiogram chest with large left lower lobe airspace opacity consistent with pneumonia, mild patchy airspace opacities right lower lobe consistent with pneumonia, right middle lobe subsegmental atelectasis versus pneumonia.  CT abdomen/pelvis with contrast with dense pneumonia left lower lobe, moderate volume stool throughout colon, nonobstructing left renal calculus.  Patient was given 3 L IVF bolus in the ED, started on IV ceftriaxone  and azithromycin .  TRH consulted for admission for further evaluation management of sepsis secondary to community-acquired pneumonia, lactic acidosis, acute renal failure.  Assessment &  Plan:   Severe sepsis, POA Community-acquired/multifocal pneumonia Lactic acidosis: resolved Patient presenting to ED after being found by neighbors on the floor of his home poorly responsive.  Patient reportedly feeling dizzy and faint with multiple falls same day.  On EMS arrival he was noted to be hypoxic, hypotensive.  Patient is afebrile with BP was 70/38, SpO2 84%, tachypneic and tachycardic with elevated lactic acid of 3.2.  Imaging consistent with severe multifocal pneumonia. -- WBC 8.8>12.3>16.3 -- Azithromycin  500 mg PO daily x 5 days -- Ceftriaxone  2 g IV every 24 hours x 5 days -- Mucinex  600 mg p.o. twice daily -- DuoNebs 3 times daily -- Continue supplemental oxygen, maintain SpO2 greater than 92%; currently on room air at rest but desaturated to 81% with ambulation yesterday -- Incentive spirometry/flutter valve -- Repeat ambulatory O2 screen tomorrow, cxr in am -- Repeat CBC in a.m.  Hyponatremia Patient presenting with a sodium of 132.  Etiology likely second to hypovolemic hyponatremia in the setting of sepsis, pneumonia as above. -- Continue to encourage increased oral intake -- BMP in a.m.  Hypokalemia Hypomagnesemia Potassium 3.7, magnesium  1.2; will replete. -- Repeat BMP in a.m. with magnesium  level  Hypotension Hx Essential hypertension Patient was notably hypotensive on ED arrival, BP 70/38.  Improved with aggressive IV fluid resuscitation with later liters IVF.  Home antihypertensive regimen includes olmesartan-HCTZ 40-25 mg p.o. daily, amlodipine  10 mg p.o. daily.   -- BP improved; 133/72 this morning -- Continue metoprolol  succinate 50 mg PO BID -- Continue to hold home olmesartan-HCTZ,  amlodipine  -- Monitor BP closely  HLD -- Atorvastatin  40 g p.o. daily  History of chronic low back pain -- Continue home oxycodone  7.5-325mg  PO q6h PRN moderate pain  Weakness/debility/deconditioning: Multiple falls: --PT/OT evaluation: no needs identified -- Fall  precautions   DVT prophylaxis: enoxaparin  (LOVENOX ) injection 40 mg Start: 08/31/23 1700    Code Status: Full Code Family Communication: No family present at bedside this morning  Disposition Plan:  Level of care: Med-Surg Status is: Inpatient Remains inpatient appropriate because: IV antibiotics, weaning from O2    Consultants:  None  Procedures:  None  Antimicrobials:  Azithromycin  5/7>> Ceftriaxone  5/7>>   Subjective: Patient seen examined bedside, lying in bed.  Continues with productive cough, overall dyspnea much improved.  Did desaturate with ambulation yesterday on room air to 81%.  Currently on room air at rest.  Discussed will continue IV antibiotics today and reassess O2 needs tomorrow.   No other specific questions, concerns or complaints at this time.  Denies headache, no fever, no chills, no nausea/vomiting/diarrhea, no chest pain, no productive cough, no palpitations, no abdominal pain, no focal weakness, no fatigue, no paresthesia.  No acute events overnight per nurse staff.  Objective: Vitals:   09/01/23 1625 09/01/23 1941 09/02/23 0433 09/02/23 0802  BP: 113/87 120/66 133/72 105/79  Pulse: 92 84 91 70  Resp: 18 18 18 16   Temp: 98.1 F (36.7 C) 98.7 F (37.1 C) 98.3 F (36.8 C) 98.1 F (36.7 C)  TempSrc: Oral Oral Oral   SpO2: 100% 93% 95% 94%  Weight:      Height:        Intake/Output Summary (Last 24 hours) at 09/02/2023 1203 Last data filed at 09/02/2023 1006 Gross per 24 hour  Intake 354 ml  Output 300 ml  Net 54 ml   Filed Weights   08/31/23 1127  Weight: 70.3 kg    Examination:  Physical Exam: GEN: NAD, alert and oriented x 3, wd/wn HEENT: NCAT, PERRL, EOMI, sclera clear, MMM PULM: Breath sounds slight diminished bilateral bases with rales, no wheezing, normal respiratory effort with accessory muscle use, on room air at rest CV: RRR w/o M/G/R GI: abd soft, NTND, + BS MSK: no peripheral edema, muscle strength globally intact 5/5  bilateral upper/lower extremities NEURO: CN II-XII intact, no focal deficits, sensation to light touch intact PSYCH: normal mood/affect Integumentary: dry/intact, no rashes or wounds    Data Reviewed: I have personally reviewed following labs and imaging studies  CBC: Recent Labs  Lab 08/31/23 1144 08/31/23 1153 08/31/23 2003 09/02/23 0558  WBC 8.8  --  12.3* 16.3*  NEUTROABS 7.8*  --   --   --   HGB 13.3 13.6  12.9* 11.3* 10.6*  HCT 38.5* 40.0  38.0* 33.3* 31.1*  MCV 91.9  --  91.7 92.0  PLT 166  --  108* 101*   Basic Metabolic Panel: Recent Labs  Lab 08/31/23 1144 08/31/23 1153 08/31/23 2003 09/02/23 0558  NA 132* 131*  131*  --  131*  K 3.3* 3.4*  3.3*  --  3.7  CL 95* 93*  --  96*  CO2 25  --   --  25  GLUCOSE 89 86  --  84  BUN 13 19  --  9  CREATININE 1.61* 1.60* 1.04 0.64  CALCIUM  8.9  --   --  8.2*  MG  --   --   --  1.2*   GFR: Estimated Creatinine Clearance: 86.7 mL/min (by C-G  formula based on SCr of 0.64 mg/dL). Liver Function Tests: Recent Labs  Lab 08/31/23 1144  AST 35  ALT 26  ALKPHOS 73  BILITOT 0.8  PROT 5.5*  ALBUMIN 3.2*   No results for input(s): "LIPASE", "AMYLASE" in the last 168 hours. No results for input(s): "AMMONIA" in the last 168 hours. Coagulation Profile: Recent Labs  Lab 08/31/23 1144  INR 1.1   Cardiac Enzymes: No results for input(s): "CKTOTAL", "CKMB", "CKMBINDEX", "TROPONINI" in the last 168 hours. BNP (last 3 results) No results for input(s): "PROBNP" in the last 8760 hours. HbA1C: No results for input(s): "HGBA1C" in the last 72 hours. CBG: No results for input(s): "GLUCAP" in the last 168 hours. Lipid Profile: No results for input(s): "CHOL", "HDL", "LDLCALC", "TRIG", "CHOLHDL", "LDLDIRECT" in the last 72 hours. Thyroid  Function Tests: No results for input(s): "TSH", "T4TOTAL", "FREET4", "T3FREE", "THYROIDAB" in the last 72 hours. Anemia Panel: No results for input(s): "VITAMINB12", "FOLATE",  "FERRITIN", "TIBC", "IRON", "RETICCTPCT" in the last 72 hours. Sepsis Labs: Recent Labs  Lab 08/31/23 1157 08/31/23 1321 08/31/23 1622  LATICACIDVEN 3.2* 3.3* 1.4    Recent Results (from the past 240 hours)  Blood Culture (routine x 2)     Status: None (Preliminary result)   Collection Time: 08/31/23 11:44 AM   Specimen: BLOOD  Result Value Ref Range Status   Specimen Description BLOOD RIGHT ANTECUBITAL  Final   Special Requests   Final    BOTTLES DRAWN AEROBIC AND ANAEROBIC Blood Culture adequate volume   Culture   Final    NO GROWTH 2 DAYS Performed at [facility], [address], [place], [place] [address]    Report Status PENDING  Incomplete  Blood Culture (routine x 2)     Status: None (Preliminary result)   Collection Time: 08/31/23 11:49 AM   Specimen: BLOOD RIGHT HAND  Result Value Ref Range Status   Specimen Description BLOOD RIGHT HAND  Final   Special Requests   Final    BOTTLES DRAWN AEROBIC ONLY Blood Culture results may not be optimal due to an inadequate volume of blood received in culture bottles   Culture   Final    NO GROWTH 2 DAYS Performed at [facility], [address], [place], [place] [address]    Report Status PENDING  Incomplete  Resp panel by RT-PCR (RSV, Flu A&B, Covid) Anterior Nasal Swab     Status: None   Collection Time: 08/31/23  3:58 PM   Specimen: Anterior Nasal Swab  Result Value Ref Range Status   SARS Coronavirus 2 by RT PCR NEGATIVE NEGATIVE Final   Influenza A by PCR NEGATIVE NEGATIVE Final   Influenza B by PCR NEGATIVE NEGATIVE Final    Comment: (NOTE) The Xpert Xpress SARS-CoV-2/FLU/RSV plus assay is intended as an aid in the diagnosis of influenza from Nasopharyngeal swab specimens and should not be used as a sole basis for treatment. Nasal washings and aspirates are unacceptable for Xpert Xpress SARS-CoV-2/FLU/RSV testing.  Fact Sheet for Patients: BloggerCourse.com  Fact Sheet for  Healthcare Providers: SeriousBroker.it  This test is not yet approved or cleared by the [place]  FDA and has been authorized for detection and/or diagnosis of SARS-CoV-2 by FDA under an Emergency Use Authorization (EUA). This EUA will remain in effect (meaning this test can be used) for the duration of the COVID-19 declaration under Section 564(b)(1) of the Act, 21 U.S.C. section 360bbb-3(b)(1), unless the authorization is terminated or revoked.  Resp Syncytial Virus by PCR NEGATIVE NEGATIVE Final    Comment: (NOTE) Fact Sheet for Patients: BloggerCourse.com  Fact Sheet for Healthcare Providers: SeriousBroker.it  This test is not yet approved or cleared by the [place]  FDA and has been authorized for detection and/or diagnosis of SARS-CoV-2 by FDA under an Emergency Use Authorization (EUA). This EUA will remain in effect (meaning this test can be used) for the duration of the COVID-19 declaration under Section 564(b)(1) of the Act, 21 U.S.C. section 360bbb-3(b)(1), unless the authorization is terminated or revoked.  Performed at [facility], [address], [place], [place] [address]          Radiology Studies: CT ABDOMEN PELVIS W CONTRAST Result Date: 08/31/2023 CLINICAL DATA:  Low back pain EXAM: CT ABDOMEN AND PELVIS WITH CONTRAST TECHNIQUE: Multidetector CT imaging of the abdomen and pelvis was performed using the standard protocol following bolus administration of intravenous contrast. RADIATION DOSE REDUCTION: This exam was performed according to the departmental dose-optimization program which includes automated exposure control, adjustment of the mA and/or kV according to patient size and/or use of iterative reconstruction technique. CONTRAST:  OMNIPAQUE  IOHEXOL  350 MG/ML SOLN COMPARISON:  CTA chest same day FINDINGS: Lower chest: Dense pneumonia within the LEFT lower  lobe. Patchy airspace disease in the RIGHT lower lobe. Hepatobiliary: No focal hepatic lesion. Postcholecystectomy. No biliary dilatation. Pancreas: Pancreas is normal. No ductal dilatation. No pancreatic inflammation. Spleen: Normal spleen Adrenals/urinary tract: Adrenal glands normal. Nonobstructing calculus measuring 6 mm in the mid LEFT kidney. Ureters and bladder normal. Stomach/Bowel: The stomach, duodenum, and small bowel normal. Moderate volume stool throughout the colon. Vascular/Lymphatic: Abdominal aorta is normal caliber with atherosclerotic calcification. There is no retroperitoneal or periportal lymphadenopathy. No pelvic lymphadenopathy. Reproductive: Prostate unremarkable Other: No free fluid. Musculoskeletal: No aggressive osseous lesion. Posterior lumbar fusion. IMPRESSION: 1. Dense pneumonia in the LEFT lower lobe. 2. Moderate volume stool throughout the colon. Recommend correlation with constipation. 3. Nonobstructing LEFT renal calculus. 4.  Aortic Atherosclerosis (ICD10-I70.0). Electronically Signed   By: Deboraha Fallow M.D.   On: 08/31/2023 14:32   CT Angio Chest PE W and/or Wo Contrast Result Date: 08/31/2023 CLINICAL DATA:  Chest pain.  Hemoptysis. EXAM: CT ANGIOGRAPHY CHEST WITH CONTRAST TECHNIQUE: Multidetector CT imaging of the chest was performed using the standard protocol during bolus administration of intravenous contrast. Multiplanar CT image reconstructions and MIPs were obtained to evaluate the vascular anatomy. RADIATION DOSE REDUCTION: This exam was performed according to the departmental dose-optimization program which includes automated exposure control, adjustment of the mA and/or kV according to patient size and/or use of iterative reconstruction technique. CONTRAST:  OMNIPAQUE  IOHEXOL  350 MG/ML SOLN COMPARISON:  July 14, 2022. FINDINGS: Cardiovascular: Satisfactory opacification of the pulmonary arteries to the segmental level. No evidence of pulmonary embolism.  Normal heart size. No pericardial effusion. Coronary artery calcifications are noted. Mediastinum/Nodes: No enlarged mediastinal, hilar, or axillary lymph nodes. Thyroid  gland, trachea, and esophagus demonstrate no significant findings. Lungs/Pleura: No pneumothorax is noted. Large left lower lobe airspace opacity is noted consistent with pneumonia. Mild patchy airspace opacities are noted in right lower lobe is well consistent with pneumonia. Mild right middle lobe opacity is noted concerning for pneumonia or atelectasis. Upper Abdomen: No acute abnormality. Musculoskeletal: No chest wall abnormality. No acute or significant osseous findings. Review of the MIP images confirms the above findings. IMPRESSION: No definite evidence of pulmonary embolus. Large left lower lobe airspace opacity is noted consistent with pneumonia. Mild patchy  airspace opacities are noted in right lower lobe also concerning for pneumonia. Mild right middle lobe subsegmental atelectasis or pneumonia. Coronary artery calcifications are noted suggesting coronary disease. Aortic Atherosclerosis (ICD10-I70.0). Electronically Signed   By: Rosalene Colon M.D.   On: 08/31/2023 14:31   CT Head Wo Contrast Result Date: 08/31/2023 CLINICAL DATA:  Altered mental status.  Episodes of falling. EXAM: CT HEAD WITHOUT CONTRAST TECHNIQUE: Contiguous axial images were obtained from the base of the skull through the vertex without intravenous contrast. RADIATION DOSE REDUCTION: This exam was performed according to the departmental dose-optimization program which includes automated exposure control, adjustment of the mA and/or kV according to patient size and/or use of iterative reconstruction technique. COMPARISON:  07/15/2008 FINDINGS: Brain: Mild age related volume loss. No evidence of old or acute focal infarction, mass lesion, hemorrhage, hydrocephalus or extra-axial collection. Vascular: No abnormal vascular finding. Skull: Normal Sinuses/Orbits:  Clear/normal Other: None IMPRESSION: No acute or traumatic finding. Mild age related volume loss. Electronically Signed   By: Bettylou Brunner M.D.   On: 08/31/2023 12:20        Scheduled Meds:  aspirin   81 mg Oral QHS   atorvastatin   40 mg Oral QHS   azithromycin   500 mg Oral Daily   enoxaparin  (LOVENOX ) injection  40 mg Subcutaneous Q24H   guaiFENesin   600 mg Oral BID   metoprolol  succinate  50 mg Oral BID   pantoprazole   40 mg Oral BID AC   Continuous Infusions:  cefTRIAXone  (ROCEPHIN )  IV 2 g (09/02/23 0604)     LOS: 2 days    Time spent: 45 minutes spent on 09/02/2023 caring for this patient face-to-face including chart review, ordering labs/tests, documenting, discussion with nursing staff, consultants, updating family and interview/physical exam    Rema Care [place], DO [facility] Available via Epic secure chat 7am-7pm After these hours, please refer to coverage provider listed on [email or web address] 09/02/2023, 12:03 PM

## 2023-09-02 NOTE — Plan of Care (Signed)

## 2023-09-03 ENCOUNTER — Inpatient Hospital Stay (HOSPITAL_COMMUNITY)

## 2023-09-03 DIAGNOSIS — J189 Pneumonia, unspecified organism: Secondary | ICD-10-CM | POA: Diagnosis not present

## 2023-09-03 LAB — CBC
HCT: 30.1 % — ABNORMAL LOW (ref 39.0–52.0)
Hemoglobin: 10.4 g/dL — ABNORMAL LOW (ref 13.0–17.0)
MCH: 31.4 pg (ref 26.0–34.0)
MCHC: 34.6 g/dL (ref 30.0–36.0)
MCV: 90.9 fL (ref 80.0–100.0)
Platelets: 110 10*3/uL — ABNORMAL LOW (ref 150–400)
RBC: 3.31 MIL/uL — ABNORMAL LOW (ref 4.22–5.81)
RDW: 12.7 % (ref 11.5–15.5)
WBC: 13.7 10*3/uL — ABNORMAL HIGH (ref 4.0–10.5)
nRBC: 0 % (ref 0.0–0.2)

## 2023-09-03 LAB — BASIC METABOLIC PANEL WITH GFR
Anion gap: 10 (ref 5–15)
BUN: 5 mg/dL — ABNORMAL LOW (ref 8–23)
CO2: 23 mmol/L (ref 22–32)
Calcium: 8.1 mg/dL — ABNORMAL LOW (ref 8.9–10.3)
Chloride: 98 mmol/L (ref 98–111)
Creatinine, Ser: 0.56 mg/dL — ABNORMAL LOW (ref 0.61–1.24)
GFR, Estimated: >60 mL/min (ref >60)
Glucose, Bld: 92 mg/dL (ref 70–99)
Potassium: 3.7 mmol/L (ref 3.5–5.1)
Sodium: 131 mmol/L — ABNORMAL LOW (ref 135–145)

## 2023-09-03 LAB — MAGNESIUM: Magnesium: 1.7 mg/dL (ref 1.7–2.4)

## 2023-09-03 LAB — PROCALCITONIN: Procalcitonin: 6.89 ng/mL

## 2023-09-03 MED ORDER — POTASSIUM CHLORIDE CRYS ER 10 MEQ PO TBCR
10.0000 meq | EXTENDED_RELEASE_TABLET | Freq: Two times a day (BID) | ORAL | Status: DC
  Filled 2023-09-03 (×2): qty 1

## 2023-09-03 MED ORDER — POTASSIUM CHLORIDE CRYS ER 10 MEQ PO TBCR: 10.0000 meq | EXTENDED_RELEASE_TABLET | Freq: Once | ORAL | Status: DC

## 2023-09-03 MED ORDER — MAGNESIUM SULFATE 2 GM/50ML IV SOLN
2.0000 g | Freq: Once | INTRAVENOUS | Status: AC
  Administered 2023-09-03: 2 g via INTRAVENOUS
  Filled 2023-09-03: qty 50

## 2023-09-03 MED ORDER — POTASSIUM CHLORIDE CRYS ER 20 MEQ PO TBCR
30.0000 meq | EXTENDED_RELEASE_TABLET | Freq: Once | ORAL | Status: AC
  Administered 2023-09-03: 30 meq via ORAL
  Filled 2023-09-03: qty 1

## 2023-09-03 MED ORDER — GUAIFENESIN ER 600 MG PO TB12: 600.0000 mg | ORAL_TABLET | Freq: Two times a day (BID) | ORAL | 0 refills | Status: AC

## 2023-09-03 MED ORDER — LEVOFLOXACIN 750 MG PO TABS: 750.0000 mg | ORAL_TABLET | Freq: Every day | ORAL | 0 refills | Status: AC

## 2023-09-03 MED ORDER — POTASSIUM CHLORIDE CRYS ER 10 MEQ PO TBCR
10.0000 meq | EXTENDED_RELEASE_TABLET | Freq: Once | ORAL | Status: DC
Start: 2023-09-03 — End: 2023-09-03

## 2023-09-03 NOTE — Discharge Summary (Signed)
 Physician Discharge Summary  Dustin Carter UJW:[id number] DOB: [birth year] DOA: 08/31/2023  PCP: Imelda Man, MD  Admit date: 08/31/2023 Discharge date: 09/03/2023  Admitted From: Home Disposition: Home  Recommendations for Outpatient Follow-up:  Follow up with PCP in 1-2 weeks Continue antibiotics on discharge with Levaquin  to complete course for community-acquired pneumonia Holding home olmesartan/HCTZ, amlodipine  for borderline hypotension, follow-up with PCP for indication for restart Repeat CBC/CBC 1 week for recheck of white blood cell count, sodium, potassium level  Home Health: No needs identified by physical/Occupational Therapy Equipment/Devices: None, titrated off of oxygen during hospitalization  Discharge Condition: Stable CODE STATUS: Full code Diet recommendation: Heart healthy diet  History of present illness:  Dustin Carter is a 68 y.o. male with past medical history significant for HTN, HLD who presented from home via EMS after being found on the floor by a neighbor in his house.  Upon EMS arrival, patient was confused and was found to have low oxygen saturations as well as hypotension.  Patient reports that he was working in his yard day prior, started having back pain and he went to bed roughly at 10 PM.  Upon awakening in the morning he was feeling dizzy and weak in which he fell multiple times in his house.   In the ED, temperature 98.9 F, HR 112, RR 21, BP 70/38, SpO2 84% on room air was placed on nasal cannula.  WBC 8.8, hemoglobin 13.3, platelet count 166.  Sodium 132, potassium 3.3, chloride 95, CO2 25, glucose 89, BUN 13, creat 1.61.  AST 35, ALT 26, total bilirubin 0.8.  High sensitive troponin 8.  Lactic acid 3.2.  FOBT negative.  Blood cultures x 2 obtained.  Urinalysis unrevealing.  COVID/influenza/RSV PCR negative.  Chest x-ray with hazy airspace disease left mid and lower lung zones and medial right base concerning for multifocal pneumonia, left pleural  effusion.  CT head without contrast with no acute or traumatic finding, mild age-related volume loss.  CT angiogram chest with large left lower lobe airspace opacity consistent with pneumonia, mild patchy airspace opacities right lower lobe consistent with pneumonia, right middle lobe subsegmental atelectasis versus pneumonia.  CT abdomen/pelvis with contrast with dense pneumonia left lower lobe, moderate volume stool throughout colon, nonobstructing left renal calculus.  Patient was given 3 L IVF bolus in the ED, started on IV ceftriaxone  and azithromycin .  TRH consulted for admission for further evaluation management of sepsis secondary to community-acquired pneumonia, lactic acidosis, acute renal failure.  Hospital course:  Severe sepsis, POA Community-acquired/multifocal pneumonia Lactic acidosis: resolved Acute hypoxic respite failure, POA Patient presenting to ED after being found by neighbors on the floor of his home poorly responsive.  Patient reportedly feeling dizzy and faint with multiple falls same day.  On EMS arrival he was noted to be hypoxic, hypotensive.  Patient is afebrile with BP was 70/38, SpO2 84%, tachypneic and tachycardic with elevated lactic acid of 3.2.  Imaging consistent with severe multifocal pneumonia.  Patient was started on IV Novox with azithromycin  and ceftriaxone .  Initially requiring supplemental oxygen which was eventually titrated off during hospitalization.  No further desaturation with ambulation.  WBC count peaked at 16.3 and improved to 13.7 at time of discharge.  Patient wishes discharge home and will continue Levaquin  to complete 10-day antibiotic course given severity of his pneumonia.  Outpatient follow-up with PCP.  Repeat CBC 1 week.   Hyponatremia Patient presenting with a sodium of 132.  Etiology likely second to hypovolemic hyponatremia in the  setting of sepsis, pneumonia as above.  Likely complicated by home HCTZ use.  Holding HCTZ on discharge.  Repeat  BMP 1 week.   Hypokalemia Hypomagnesemia Repleted during hospitalization.  Repeat BMP 1 week.   Hypotension Hx Essential hypertension Patient was notably hypotensive on ED arrival, BP 70/38.  Improved with aggressive IV fluid resuscitation with later liters IVF.  Home antihypertensive regimen includes olmesartan-HCTZ 40-25 mg p.o. daily, amlodipine  10 mg p.o. daily.  Will continue metoprolol  succinate 50 mg p.o. twice daily on discharge.  Holding home olmesartan/HCTZ and amlodipine .  Outpatient follow-up with PCP.   HLD Atorvastatin  40 g p.o. daily   History of chronic low back pain Continue home oxycodone  7.5-325mg  PO q6h PRN moderate pain   Weakness/debility/deconditioning: Multiple falls: Seen by PT and OT with no need identified.  Discharge Diagnoses:  Principal Problem:   CAP (community acquired pneumonia)    Discharge Instructions  Discharge Instructions     Call MD for:  difficulty breathing, headache or visual disturbances   Complete by: As directed    Call MD for:  extreme fatigue   Complete by: As directed    Call MD for:  persistant dizziness or light-headedness   Complete by: As directed    Call MD for:  persistant nausea and vomiting   Complete by: As directed    Call MD for:  severe uncontrolled pain   Complete by: As directed    Call MD for:  temperature >100.4   Complete by: As directed    Diet - low sodium heart healthy   Complete by: As directed    Increase activity slowly   Complete by: As directed       Allergies as of 09/03/2023       Reactions   Butrans [buprenorphine] Itching      Other Itching   Pain patch (unsure of name)        Medication List     PAUSE taking these medications    amLODipine  10 MG tablet Wait to take this until your doctor or other care provider tells you to start again. Commonly known as: NORVASC  Take 10 mg by mouth daily.   hydrochlorothiazide 12.5 MG tablet Wait to take this until your doctor or other  care provider tells you to start again. Commonly known as: HYDRODIURIL Take 12.5 mg by mouth daily.   olmesartan-hydrochlorothiazide 40-25 MG tablet Wait to take this until your doctor or other care provider tells you to start again. Commonly known as: BENICAR HCT Take 1 tablet by mouth daily.       TAKE these medications    amitriptyline  150 MG tablet Commonly known as: ELAVIL  Take 150 mg by mouth at bedtime.   aspirin  81 MG chewable tablet Chew 81 mg by mouth daily.   atorvastatin  40 MG tablet Commonly known as: LIPITOR Take 40 mg by mouth at bedtime.   cyclobenzaprine  10 MG tablet Commonly known as: FLEXERIL  Take 1 tablet (10 mg total) by mouth 3 (three) times daily as needed for muscle spasms. What changed: when to take this   gabapentin  300 MG capsule Commonly known as: NEURONTIN  Take 300-600 mg by mouth See admin instructions. Take 300 mg by mouth in the morning and afternoon and 600 mg at night   guaiFENesin  600 MG 12 hr tablet Commonly known as: MUCINEX  Take 1 tablet (600 mg total) by mouth 2 (two) times daily for 14 days.   levofloxacin  750 MG tablet Commonly known as: Levaquin  Take 1  tablet (750 mg total) by mouth daily for 7 days.   metoprolol  succinate 100 MG 24 hr tablet Commonly known as: TOPROL -XL TAKE 1 TABLET BY MOUTH EVERY MORNING WITH OR IMMEDIATELY FOLLOWING A MEAL What changed: See the new instructions.   oxyCODONE -acetaminophen  7.5-325 MG tablet Commonly known as: PERCOCET Take 1 tablet by mouth every 6 (six) hours as needed for moderate pain.   pantoprazole  40 MG tablet Commonly known as: PROTONIX  Take 1 tablet (40 mg total) by mouth 2 (two) times daily before a meal. What changed: when to take this        Follow-up Information     Imelda Man, MD. Schedule an appointment as soon as possible for a visit in 1 week(s).   Specialty: Internal Medicine Contact information: [address] [place]  [address] [phone]                Allergies  Allergen Reactions   Butrans [Buprenorphine] Itching        Other Itching    Pain patch (unsure of name)    Consultations: None   Procedures/Studies: CT ABDOMEN PELVIS W CONTRAST Result Date: 08/31/2023 CLINICAL DATA:  Low back pain EXAM: CT ABDOMEN AND PELVIS WITH CONTRAST TECHNIQUE: Multidetector CT imaging of the abdomen and pelvis was performed using the standard protocol following bolus administration of intravenous contrast. RADIATION DOSE REDUCTION: This exam was performed according to the departmental dose-optimization program which includes automated exposure control, adjustment of the mA and/or kV according to patient size and/or use of iterative reconstruction technique. CONTRAST:  OMNIPAQUE  IOHEXOL  350 MG/ML SOLN COMPARISON:  CTA chest same day FINDINGS: Lower chest: Dense pneumonia within the LEFT lower lobe. Patchy airspace disease in the RIGHT lower lobe. Hepatobiliary: No focal hepatic lesion. Postcholecystectomy. No biliary dilatation. Pancreas: Pancreas is normal. No ductal dilatation. No pancreatic inflammation. Spleen: Normal spleen Adrenals/urinary tract: Adrenal glands normal. Nonobstructing calculus measuring 6 mm in the mid LEFT kidney. Ureters and bladder normal. Stomach/Bowel: The stomach, duodenum, and small bowel normal. Moderate volume stool throughout the colon. Vascular/Lymphatic: Abdominal aorta is normal caliber with atherosclerotic calcification. There is no retroperitoneal or periportal lymphadenopathy. No pelvic lymphadenopathy. Reproductive: Prostate unremarkable Other: No free fluid. Musculoskeletal: No aggressive osseous lesion. Posterior lumbar fusion. IMPRESSION: 1. Dense pneumonia in the LEFT lower lobe. 2. Moderate volume stool throughout the colon. Recommend correlation with constipation. 3. Nonobstructing LEFT renal calculus. 4.  Aortic Atherosclerosis (ICD10-I70.0). Electronically Signed   By:  Deboraha Fallow M.D.   On: 08/31/2023 14:32   CT Angio Chest PE W and/or Wo Contrast Result Date: 08/31/2023 CLINICAL DATA:  Chest pain.  Hemoptysis. EXAM: CT ANGIOGRAPHY CHEST WITH CONTRAST TECHNIQUE: Multidetector CT imaging of the chest was performed using the standard protocol during bolus administration of intravenous contrast. Multiplanar CT image reconstructions and MIPs were obtained to evaluate the vascular anatomy. RADIATION DOSE REDUCTION: This exam was performed according to the departmental dose-optimization program which includes automated exposure control, adjustment of the mA and/or kV according to patient size and/or use of iterative reconstruction technique. CONTRAST:  OMNIPAQUE  IOHEXOL  350 MG/ML SOLN COMPARISON:  July 14, 2022. FINDINGS: Cardiovascular: Satisfactory opacification of the pulmonary arteries to the segmental level. No evidence of pulmonary embolism. Normal heart size. No pericardial effusion. Coronary artery calcifications are noted. Mediastinum/Nodes: No enlarged mediastinal, hilar, or axillary lymph nodes. Thyroid  gland, trachea, and esophagus demonstrate no significant findings. Lungs/Pleura: No pneumothorax is noted. Large left lower lobe airspace opacity is noted consistent with  pneumonia. Mild patchy airspace opacities are noted in right lower lobe is well consistent with pneumonia. Mild right middle lobe opacity is noted concerning for pneumonia or atelectasis. Upper Abdomen: No acute abnormality. Musculoskeletal: No chest wall abnormality. No acute or significant osseous findings. Review of the MIP images confirms the above findings. IMPRESSION: No definite evidence of pulmonary embolus. Large left lower lobe airspace opacity is noted consistent with pneumonia. Mild patchy airspace opacities are noted in right lower lobe also concerning for pneumonia. Mild right middle lobe subsegmental atelectasis or pneumonia. Coronary artery calcifications are noted suggesting  coronary disease. Aortic Atherosclerosis (ICD10-I70.0). Electronically Signed   By: Rosalene Colon M.D.   On: 08/31/2023 14:31   DG Chest Port 1 View Result Date: 08/31/2023 CLINICAL DATA:  Questionable sepsis - evaluate for abnormality EXAM: PORTABLE CHEST - 1 VIEW COMPARISON:  April 30, 2017 FINDINGS: Hazy airspace opacities in the left mid and lower lung zones with minimal hazy airspace opacities in the medial right lung base. Blunting of the left costophrenic sulcus. No pneumothorax. No cardiomegaly. Tortuous aorta with aortic atherosclerosis. No acute fracture or destructive lesions. Multilevel thoracic osteophytosis. Cervical fusion hardware. Bilateral glenohumeral joint arthroplasties. IMPRESSION: Hazy airspace disease in the left mid and lower lung zones and the right medial lung base, worrisome for multifocal pneumonia. A left pleural effusion is also likely present. Electronically Signed   By: Rance Burrows M.D.   On: 08/31/2023 12:35   CT Head Wo Contrast Result Date: 08/31/2023 CLINICAL DATA:  Altered mental status.  Episodes of falling. EXAM: CT HEAD WITHOUT CONTRAST TECHNIQUE: Contiguous axial images were obtained from the base of the skull through the vertex without intravenous contrast. RADIATION DOSE REDUCTION: This exam was performed according to the departmental dose-optimization program which includes automated exposure control, adjustment of the mA and/or kV according to patient size and/or use of iterative reconstruction technique. COMPARISON:  07/15/2008 FINDINGS: Brain: Mild age related volume loss. No evidence of old or acute focal infarction, mass lesion, hemorrhage, hydrocephalus or extra-axial collection. Vascular: No abnormal vascular finding. Skull: Normal Sinuses/Orbits: Clear/normal Other: None IMPRESSION: No acute or traumatic finding. Mild age related volume loss. Electronically Signed   By: Bettylou Brunner M.D.   On: 08/31/2023 12:20     Subjective: Patient seen examined  bedside, lying in bed.  Ready for discharge home.  No complaints.  Reported that he ambulated off of oxygen with mobility tech yesterday and no desaturation.  No cough today.  Denies headache, no dizziness, no chest pain, no palpitations, no shortness of breath, no abdominal pain, no fever/chills/night sweats, no nausea/vomiting/diarrhea, no focal weakness, no fatigue, no paresthesia.  No acute events overnight per nursing.  Discharge Exam: Vitals:   09/02/23 2123 09/03/23 0508  BP: 139/82 139/81  Pulse: 93 79  Resp:  18  Temp:  98.1 F (36.7 C)  SpO2:  97%   Vitals:   09/02/23 1435 09/02/23 1954 09/02/23 2123 09/03/23 0508  BP: 133/73 139/82 139/82 139/81  Pulse: 94 93 93 79  Resp: 18 18  18   Temp: 98.1 F (36.7 C) 98.2 F (36.8 C)  98.1 F (36.7 C)  TempSrc:  Oral  Oral  SpO2: 94% 100%  97%  Weight:      Height:        Physical Exam: GEN: NAD, alert and oriented x 3, wd/wn HEENT: NCAT, PERRL, EOMI, sclera clear, MMM PULM: CTAB w/o wheezes/crackles, normal respiratory effort, on room air with SpO2 97% CV: RRR w/o  M/G/R GI: abd soft, NTND, NABS, no R/G/M MSK: no peripheral edema, muscle strength globally intact 5/5 bilateral upper/lower extremities NEURO: CN II-XII intact, no focal deficits, sensation to light touch intact PSYCH: normal mood/affect Integumentary: dry/intact, no rashes or wounds    The results of significant diagnostics from this hospitalization (including imaging, microbiology, ancillary and laboratory) are listed below for reference.     Microbiology: Recent Results (from the past 240 hours)  Blood Culture (routine x 2)     Status: None (Preliminary result)   Collection Time: 08/31/23 11:44 AM   Specimen: BLOOD  Result Value Ref Range Status   Specimen Description BLOOD RIGHT ANTECUBITAL  Final   Special Requests   Final    BOTTLES DRAWN AEROBIC AND ANAEROBIC Blood Culture adequate volume   Culture   Final    NO GROWTH 3 DAYS Performed at [facility], [address], [place], [place] [address]    Report Status PENDING  Incomplete  Blood Culture (routine x 2)     Status: None (Preliminary result)   Collection Time: 08/31/23 11:49 AM   Specimen: BLOOD RIGHT HAND  Result Value Ref Range Status   Specimen Description BLOOD RIGHT HAND  Final   Special Requests   Final    BOTTLES DRAWN AEROBIC ONLY Blood Culture results may not be optimal due to an inadequate volume of blood received in culture bottles   Culture   Final    NO GROWTH 3 DAYS Performed at [facility], [address], [place], [place] [address]    Report Status PENDING  Incomplete  Resp panel by RT-PCR (RSV, Flu A&B, Covid) Anterior Nasal Swab     Status: None   Collection Time: 08/31/23  3:58 PM   Specimen: Anterior Nasal Swab  Result Value Ref Range Status   SARS Coronavirus 2 by RT PCR NEGATIVE NEGATIVE Final   Influenza A by PCR NEGATIVE NEGATIVE Final   Influenza B by PCR NEGATIVE NEGATIVE Final    Comment: (NOTE) The Xpert Xpress SARS-CoV-2/FLU/RSV plus assay is intended as an aid in the diagnosis of influenza from Nasopharyngeal swab specimens and should not be used as a sole basis for treatment. Nasal washings and aspirates are unacceptable for Xpert Xpress SARS-CoV-2/FLU/RSV testing.  Fact Sheet for Patients: BloggerCourse.com  Fact Sheet for Healthcare Providers: SeriousBroker.it  This test is not yet approved or cleared by the [place]  FDA and has been authorized for detection and/or diagnosis of SARS-CoV-2 by FDA under an Emergency Use Authorization (EUA). This EUA will remain in effect (meaning this test can be used) for the duration of the COVID-19 declaration under Section 564(b)(1) of the Act, 21 U.S.C. section 360bbb-3(b)(1), unless the authorization is terminated or revoked.     Resp Syncytial Virus by PCR NEGATIVE NEGATIVE Final    Comment: (NOTE) Fact Sheet for  Patients: BloggerCourse.com  Fact Sheet for Healthcare Providers: SeriousBroker.it  This test is not yet approved or cleared by the [place]  FDA and has been authorized for detection and/or diagnosis of SARS-CoV-2 by FDA under an Emergency Use Authorization (EUA). This EUA will remain in effect (meaning this test can be used) for the duration of the COVID-19 declaration under Section 564(b)(1) of the Act, 21 U.S.C. section 360bbb-3(b)(1), unless the authorization is terminated or revoked.  Performed at [facility], [address], [place], [place] [address]      Labs: BNP (last 3 results) No results for input(s): "BNP" in  the last 8760 hours. Basic Metabolic Panel: Recent Labs  Lab 08/31/23 1144 08/31/23 1153 08/31/23 2003 09/02/23 0558 09/03/23 0333  NA 132* 131*  131*  --  131* 131*  K 3.3* 3.4*  3.3*  --  3.7 3.7  CL 95* 93*  --  96* 98  CO2 25  --   --  25 23  GLUCOSE 89 86  --  84 92  BUN 13 19  --  9 5*  CREATININE 1.61* 1.60* 1.04 0.64 0.56*  CALCIUM  8.9  --   --  8.2* 8.1*  MG  --   --   --  1.2* 1.7   Liver Function Tests: Recent Labs  Lab 08/31/23 1144  AST 35  ALT 26  ALKPHOS 73  BILITOT 0.8  PROT 5.5*  ALBUMIN 3.2*   No results for input(s): "LIPASE", "AMYLASE" in the last 168 hours. No results for input(s): "AMMONIA" in the last 168 hours. CBC: Recent Labs  Lab 08/31/23 1144 08/31/23 1153 08/31/23 2003 09/02/23 0558 09/03/23 0333  WBC 8.8  --  12.3* 16.3* 13.7*  NEUTROABS 7.8*  --   --   --   --   HGB 13.3 13.6  12.9* 11.3* 10.6* 10.4*  HCT 38.5* 40.0  38.0* 33.3* 31.1* 30.1*  MCV 91.9  --  91.7 92.0 90.9  PLT 166  --  108* 101* 110*   Cardiac Enzymes: No results for input(s): "CKTOTAL", "CKMB", "CKMBINDEX", "TROPONINI" in the last 168 hours. BNP: Invalid input(s): "POCBNP" CBG: No results for input(s): "GLUCAP" in the last 168 hours. D-Dimer No results for  input(s): "DDIMER" in the last 72 hours. Hgb A1c No results for input(s): "HGBA1C" in the last 72 hours. Lipid Profile No results for input(s): "CHOL", "HDL", "LDLCALC", "TRIG", "CHOLHDL", "LDLDIRECT" in the last 72 hours. Thyroid  function studies No results for input(s): "TSH", "T4TOTAL", "T3FREE", "THYROIDAB" in the last 72 hours.  Invalid input(s): "FREET3" Anemia work up No results for input(s): "VITAMINB12", "FOLATE", "FERRITIN", "TIBC", "IRON", "RETICCTPCT" in the last 72 hours. Urinalysis    Component Value Date/Time   COLORURINE YELLOW 09/01/2023 0745   APPEARANCEUR CLEAR 09/01/2023 0745   LABSPEC 1.020 09/01/2023 0745   PHURINE 5.0 09/01/2023 0745   GLUCOSEU NEGATIVE 09/01/2023 0745   HGBUR NEGATIVE 09/01/2023 0745   BILIRUBINUR NEGATIVE 09/01/2023 0745   KETONESUR 5 (A) 09/01/2023 0745   PROTEINUR NEGATIVE 09/01/2023 0745   UROBILINOGEN 0.2 04/01/2012 1833   NITRITE NEGATIVE 09/01/2023 0745   LEUKOCYTESUR NEGATIVE 09/01/2023 0745   Sepsis Labs Recent Labs  Lab 08/31/23 1144 08/31/23 2003 09/02/23 0558 09/03/23 0333  WBC 8.8 12.3* 16.3* 13.7*   Microbiology Recent Results (from the past 240 hours)  Blood Culture (routine x 2)     Status: None (Preliminary result)   Collection Time: 08/31/23 11:44 AM   Specimen: BLOOD  Result Value Ref Range Status   Specimen Description BLOOD RIGHT ANTECUBITAL  Final   Special Requests   Final    BOTTLES DRAWN AEROBIC AND ANAEROBIC Blood Culture adequate volume   Culture   Final    NO GROWTH 3 DAYS Performed at [facility], [address], [place], [place] [address]    Report Status PENDING  Incomplete  Blood Culture (routine x 2)     Status: None (Preliminary result)   Collection Time: 08/31/23 11:49 AM   Specimen: BLOOD RIGHT HAND  Result Value Ref Range Status   Specimen Description BLOOD RIGHT HAND  Final   Special  Requests   Final    BOTTLES DRAWN AEROBIC ONLY Blood Culture results may not be optimal due to  an inadequate volume of blood received in culture bottles   Culture   Final    NO GROWTH 3 DAYS Performed at [facility], [address] [place], [place] [address]    Report Status PENDING  Incomplete  Resp panel by RT-PCR (RSV, Flu A&B, Covid) Anterior Nasal Swab     Status: None   Collection Time: 08/31/23  3:58 PM   Specimen: Anterior Nasal Swab  Result Value Ref Range Status   SARS Coronavirus 2 by RT PCR NEGATIVE NEGATIVE Final   Influenza A by PCR NEGATIVE NEGATIVE Final   Influenza B by PCR NEGATIVE NEGATIVE Final    Comment: (NOTE) The Xpert Xpress SARS-CoV-2/FLU/RSV plus assay is intended as an aid in the diagnosis of influenza from Nasopharyngeal swab specimens and should not be used as a sole basis for treatment. Nasal washings and aspirates are unacceptable for Xpert Xpress SARS-CoV-2/FLU/RSV testing.  Fact Sheet for Patients: BloggerCourse.com  Fact Sheet for Healthcare Providers: SeriousBroker.it  This test is not yet approved or cleared by the [place]  FDA and has been authorized for detection and/or diagnosis of SARS-CoV-2 by FDA under an Emergency Use Authorization (EUA). This EUA will remain in effect (meaning this test can be used) for the duration of the COVID-19 declaration under Section 564(b)(1) of the Act, 21 U.S.C. section 360bbb-3(b)(1), unless the authorization is terminated or revoked.     Resp Syncytial Virus by PCR NEGATIVE NEGATIVE Final    Comment: (NOTE) Fact Sheet for Patients: BloggerCourse.com  Fact Sheet for Healthcare Providers: SeriousBroker.it  This test is not yet approved or cleared by the [place]  FDA and has been authorized for detection and/or diagnosis of SARS-CoV-2 by FDA under an Emergency Use Authorization (EUA). This EUA will remain in effect (meaning this test can be used) for the duration of the COVID-19  declaration under Section 564(b)(1) of the Act, 21 U.S.C. section 360bbb-3(b)(1), unless the authorization is terminated or revoked.  Performed at [facility], [address] [place], [place] [address]      Time coordinating discharge: Over 30 minutes  SIGNED:   Rema Care [place], DO  [facility] 09/03/2023, 8:56 AM

## 2023-09-03 NOTE — Progress Notes (Signed)
 Patient verbalized understanding of dc instructions. All belongings and paperwork given to patient

## 2023-09-03 NOTE — Progress Notes (Signed)
SATURATION QUALIFICATIONS: (This note is used to comply with regulatory documentation for home oxygen)  Patient Saturations on Room Air at Rest = 99%  Patient Saturations on Room Air while Ambulating = 99%   

## 2023-09-03 NOTE — Plan of Care (Signed)

## 2023-09-03 NOTE — Progress Notes (Signed)
 Mobility Specialist Progress Note:    09/03/23 0800  Mobility  Activity Dangled on edge of bed  Level of Assistance Modified independent, requires aide device or extra time  Assistive Device None  Activity Response Tolerated well  Mobility Referral Yes  Mobility visit 1 Mobility  Mobility Specialist Start Time (ACUTE ONLY) [id number]  Mobility Specialist Stop Time (ACUTE ONLY) [id number]  Mobility Specialist Time Calculation (min) (ACUTE ONLY) 4 min   Pt received in bed and agreeable. Declined ambulation d/t soon discharging. Agreeable to sit EOB. Pt left on EOB with call bell and bed alarm on.  D'Vante Nolon Baxter Mobility Specialist Please contact via Special educational needs teacher or Rehab office at [phone]

## 2023-09-04 ENCOUNTER — Emergency Department (HOSPITAL_COMMUNITY)
Admission: EM | Admit: 2023-09-04 | Discharge: 2023-09-04 | Disposition: A | Discharge status: Home / Self Care | Attending: Emergency Medicine | Admitting: Emergency Medicine

## 2023-09-04 ENCOUNTER — Emergency Department (HOSPITAL_COMMUNITY)

## 2023-09-04 ENCOUNTER — Encounter (HOSPITAL_COMMUNITY): Payer: Self-pay

## 2023-09-04 ENCOUNTER — Other Ambulatory Visit: Payer: Self-pay

## 2023-09-04 DIAGNOSIS — Z96611 Presence of right artificial shoulder joint: Secondary | ICD-10-CM | POA: Diagnosis not present

## 2023-09-04 DIAGNOSIS — Z471 Aftercare following joint replacement surgery: Secondary | ICD-10-CM | POA: Diagnosis not present

## 2023-09-04 DIAGNOSIS — R918 Other nonspecific abnormal finding of lung field: Secondary | ICD-10-CM | POA: Diagnosis not present

## 2023-09-04 DIAGNOSIS — R042 Hemoptysis: Secondary | ICD-10-CM | POA: Diagnosis not present

## 2023-09-04 DIAGNOSIS — I10 Essential (primary) hypertension: Secondary | ICD-10-CM | POA: Diagnosis not present

## 2023-09-04 DIAGNOSIS — I2510 Atherosclerotic heart disease of native coronary artery without angina pectoris: Secondary | ICD-10-CM | POA: Diagnosis not present

## 2023-09-04 DIAGNOSIS — Z7982 Long term (current) use of aspirin: Secondary | ICD-10-CM | POA: Diagnosis not present

## 2023-09-04 DIAGNOSIS — J984 Other disorders of lung: Secondary | ICD-10-CM | POA: Diagnosis not present

## 2023-09-04 DIAGNOSIS — R000 Tachycardia, unspecified: Secondary | ICD-10-CM | POA: Diagnosis not present

## 2023-09-04 LAB — I-STAT CHEM 8, ED
BUN: 5 mg/dL — ABNORMAL LOW (ref 8–23)
Calcium, Ion: 0.98 mmol/L — ABNORMAL LOW (ref 1.15–1.40)
Chloride: 100 mmol/L (ref 98–111)
Creatinine, Ser: 0.5 mg/dL — ABNORMAL LOW (ref 0.61–1.24)
Glucose, Bld: 95 mg/dL (ref 70–99)
HCT: 29 % — ABNORMAL LOW (ref 39.0–52.0)
Hemoglobin: 9.9 g/dL — ABNORMAL LOW (ref 13.0–17.0)
Potassium: 3.6 mmol/L (ref 3.5–5.1)
Sodium: 132 mmol/L — ABNORMAL LOW (ref 135–145)
TCO2: 26 mmol/L (ref 22–32)

## 2023-09-04 LAB — CBC WITH DIFFERENTIAL/PLATELET
Abs Immature Granulocytes: 0.05 10*3/uL (ref 0.00–0.07)
Basophils Absolute: 0 10*3/uL (ref 0.0–0.1)
Basophils Relative: 0 %
Eosinophils Absolute: 0.1 10*3/uL (ref 0.0–0.5)
Eosinophils Relative: 1 %
HCT: 32.9 % — ABNORMAL LOW (ref 39.0–52.0)
Hemoglobin: 11.2 g/dL — ABNORMAL LOW (ref 13.0–17.0)
Immature Granulocytes: 1 %
Lymphocytes Relative: 11 %
Lymphs Abs: 0.9 10*3/uL (ref 0.7–4.0)
MCH: 30.9 pg (ref 26.0–34.0)
MCHC: 34 g/dL (ref 30.0–36.0)
MCV: 90.6 fL (ref 80.0–100.0)
Monocytes Absolute: 0.5 10*3/uL (ref 0.1–1.0)
Monocytes Relative: 6 %
Neutro Abs: 7 10*3/uL (ref 1.7–7.7)
Neutrophils Relative %: 81 %
Platelets: 132 10*3/uL — ABNORMAL LOW (ref 150–400)
RBC: 3.63 MIL/uL — ABNORMAL LOW (ref 4.22–5.81)
RDW: 12.7 % (ref 11.5–15.5)
WBC: 8.6 10*3/uL (ref 4.0–10.5)
nRBC: 0 % (ref 0.0–0.2)

## 2023-09-04 LAB — TROPONIN I (HIGH SENSITIVITY)
Troponin I (High Sensitivity): 15 ng/L (ref <18)
Troponin I (High Sensitivity): 16 ng/L (ref <18)

## 2023-09-04 LAB — COMPREHENSIVE METABOLIC PANEL WITH GFR
ALT: 25 U/L (ref 0–44)
AST: 31 U/L (ref 15–41)
Albumin: 2.7 g/dL — ABNORMAL LOW (ref 3.5–5.0)
Alkaline Phosphatase: 69 U/L (ref 38–126)
Anion gap: 9 (ref 5–15)
BUN: 6 mg/dL — ABNORMAL LOW (ref 8–23)
CO2: 24 mmol/L (ref 22–32)
Calcium: 8.5 mg/dL — ABNORMAL LOW (ref 8.9–10.3)
Chloride: 99 mmol/L (ref 98–111)
Creatinine, Ser: 0.58 mg/dL — ABNORMAL LOW (ref 0.61–1.24)
GFR, Estimated: >60 mL/min (ref >60)
Glucose, Bld: 97 mg/dL (ref 70–99)
Potassium: 3.5 mmol/L (ref 3.5–5.1)
Sodium: 132 mmol/L — ABNORMAL LOW (ref 135–145)
Total Bilirubin: 0.8 mg/dL (ref 0.0–1.2)
Total Protein: 5.8 g/dL — ABNORMAL LOW (ref 6.5–8.1)

## 2023-09-04 LAB — I-STAT CG4 LACTIC ACID, ED: Lactic Acid, Venous: 1.3 mmol/L (ref 0.5–1.9)

## 2023-09-04 MED ORDER — IOHEXOL 350 MG/ML SOLN
75.0000 mL | Freq: Once | INTRAVENOUS | Status: AC | PRN
  Administered 2023-09-04: 75 mL via INTRAVENOUS

## 2023-09-04 NOTE — ED Triage Notes (Signed)
 Pt bib ems from home; discharged yesterday after treatment for pneumonia; pt called put for right red tinged mucus; denies pain, denies sob; T 99.53F, HR 109, RR 24, BP 144/90

## 2023-09-04 NOTE — Discharge Instructions (Signed)
 Please follow-up with your primary care provider closely in regards to recent symptoms and ER visit.  Today your labs and imaging show that the pneumonia is getting better and the blood you are coughing up is most likely from the pneumonia.  Please follow-up closely with your primary care provider and if symptoms change or worsen please return to the ER.

## 2023-09-04 NOTE — ED Provider Notes (Signed)
 [place] EMERGENCY DEPARTMENT AT [facility] Provider Note   CSN: [id number] Arrival date & time: 09/04/23  0749     History  Chief Complaint  Patient presents with   Hemoptysis    Dustin Carter is a 68 y.o. male recently discharged yesterday for pneumonia discharged on Levaquin  presented after having hemoptysis.  Patient c/o 1 episode where he coughed up a streak of blood but since then has been asymptomatic.  Patient denies chest pain or shortness of breath.  Patient denies any previous history of blood clots, fevers, chest pain, shortness of breath, leg swelling.  Patient does not smoke.  Patient states he is currently asymptomatic and only came in due to that 1 episode of hemoptysis.  Home Medications Prior to Admission medications   Medication Sig Start Date End Date Taking? Authorizing Provider  amitriptyline  (ELAVIL ) 150 MG tablet Take 150 mg by mouth at bedtime.   Yes [provider]  aspirin  81 MG chewable tablet Chew 81 mg by mouth daily. 07/28/22  Yes [provider]  atorvastatin  (LIPITOR) 40 MG tablet Take 40 mg by mouth at bedtime.   Yes [provider]  cyclobenzaprine  (FLEXERIL ) 10 MG tablet Take 1 tablet (10 mg total) by mouth 3 (three) times daily as needed for muscle spasms. Patient taking differently: Take 10 mg by mouth 2 (two) times daily as needed for muscle spasms. 12/20/19  Yes Shuford, Sherrlyn Dolores, PA-C  gabapentin  (NEURONTIN ) 300 MG capsule Take 300-600 mg by mouth See admin instructions. Take 300 mg by mouth in the morning and afternoon and 600 mg at night   Yes [provider]  levofloxacin  (LEVAQUIN ) 750 MG tablet Take 1 tablet (750 mg total) by mouth daily for 7 days. 09/03/23 09/10/23 Yes [place], Eric J, DO  metoprolol  succinate (TOPROL -XL) 100 MG 24 hr tablet TAKE 1 TABLET BY MOUTH EVERY MORNING WITH OR IMMEDIATELY FOLLOWING A MEAL Patient taking differently: Take 50 mg by mouth 2 (two) times daily. 07/11/17  Yes [name], MD  oxyCODONE -acetaminophen  (PERCOCET) 7.5-325 MG tablet Take 1 tablet by mouth every 6 (six) hours as needed for moderate pain.  06/15/19  Yes [provider]  pantoprazole  (PROTONIX ) 40 MG tablet Take 1 tablet (40 mg total) by mouth 2 (two) times daily before a meal. Patient taking differently: Take 40 mg by mouth daily. 01/28/19  Yes Rizwan, Saima, MD  amLODipine  (NORVASC ) 10 MG tablet Take 10 mg by mouth daily. Patient not taking: Reported on 09/04/2023 06/14/23   [provider]  guaiFENesin  (MUCINEX ) 600 MG 12 hr tablet Take 1 tablet (600 mg total) by mouth 2 (two) times daily for 14 days. Patient not taking: Reported on 09/04/2023 09/03/23 09/17/23  [place], Eric J, DO  hydrochlorothiazide (HYDRODIURIL) 12.5 MG tablet Take 12.5 mg by mouth daily. Patient not taking: Reported on 09/04/2023    [provider]  olmesartan-hydrochlorothiazide (BENICAR HCT) 40-25 MG tablet Take 1 tablet by mouth daily. Patient not taking: Reported on 09/04/2023 06/28/23   [provider]      Allergies    Butrans [buprenorphine]    Review of Systems   Review of Systems  Physical Exam Updated Vital Signs BP (!) 151/101   Pulse 91   Temp 97.9 F (36.6 C) (Oral)   Resp 17   Ht 5\' 9"  (1.753 m)   Wt 68 kg   SpO2 100%   BMI 22.15 kg/m  Physical Exam Vitals reviewed.  Constitutional:  General: He is not in acute distress. HENT:     Head: Normocephalic and atraumatic.  Eyes:     Extraocular Movements: Extraocular movements intact.     Conjunctiva/sclera: Conjunctivae normal.     Pupils: Pupils are equal, round, and reactive to light.  Cardiovascular:     Rate and Rhythm: Normal rate and regular rhythm.     Pulses: Normal pulses.     Heart sounds: Normal heart sounds.     Comments: 2+ bilateral radial/dorsalis pedis pulses with regular rate Pulmonary:     Effort: Pulmonary effort is normal. No respiratory distress.     Breath sounds: Normal breath  sounds.  Abdominal:     Palpations: Abdomen is soft.     Tenderness: There is no abdominal tenderness. There is no guarding or rebound.  Musculoskeletal:        General: Normal range of motion.     Cervical back: Normal range of motion and neck supple.     Right lower leg: No edema.     Left lower leg: No edema.     Comments: 5 out of 5 bilateral grip/leg extension strength  Skin:    General: Skin is warm and dry.     Capillary Refill: Capillary refill takes less than 2 seconds.  Neurological:     General: No focal deficit present.     Mental Status: He is alert and oriented to person, place, and time.     Comments: Sensation intact in all 4 limbs  Psychiatric:        Mood and Affect: Mood normal.     ED Results / Procedures / Treatments   Labs (all labs ordered are listed, but only abnormal results are displayed) Labs Reviewed  COMPREHENSIVE METABOLIC PANEL WITH GFR - Abnormal; Notable for the following components:      Result Value   Sodium 132 (*)    BUN 6 (*)    Creatinine, Ser 0.58 (*)    Calcium  8.5 (*)    Total Protein 5.8 (*)    Albumin 2.7 (*)    All other components within normal limits  CBC WITH DIFFERENTIAL/PLATELET - Abnormal; Notable for the following components:   RBC 3.63 (*)    Hemoglobin 11.2 (*)    HCT 32.9 (*)    Platelets 132 (*)    All other components within normal limits  I-STAT CHEM 8, ED - Abnormal; Notable for the following components:   Sodium 132 (*)    BUN 5 (*)    Creatinine, Ser 0.50 (*)    Calcium , Ion 0.98 (*)    Hemoglobin 9.9 (*)    HCT 29.0 (*)    All other components within normal limits  I-STAT CG4 LACTIC ACID, ED  I-STAT CG4 LACTIC ACID, ED  TROPONIN I (HIGH SENSITIVITY)  TROPONIN I (HIGH SENSITIVITY)    EKG EKG Interpretation Date/Time:  Sunday Sep 04 2023 08:06:20 EDT Ventricular Rate:  97 PR Interval:  159 QRS Duration:  89 QT Interval:  332 QTC Calculation: 422 R Axis:   69  Text Interpretation: Sinus rhythm  Consider left atrial enlargement similar prior no stemi Confirmed by Russella Courts ([id number]) on 09/04/2023 8:14:05 AM  Radiology CT Angio Chest PE W/Cm &/Or Wo Cm Result Date: 09/04/2023 CLINICAL DATA:  Hemoptysis EXAM: CT ANGIOGRAPHY CHEST WITH CONTRAST TECHNIQUE: Multidetector CT imaging of the chest was performed using the standard protocol during bolus administration of intravenous contrast. Multiplanar CT image reconstructions and MIPs were obtained to  evaluate the vascular anatomy. Multiplanar image (3D post-processing) reconstructions and MIPs were obtained to evaluate the vascular anatomy. RADIATION DOSE REDUCTION: This exam was performed according to the departmental dose-optimization program which includes automated exposure control, adjustment of the mA and/or kV according to patient size and/or use of iterative reconstruction technique. CONTRAST:  75mL OMNIPAQUE  IOHEXOL  350 MG/ML SOLN COMPARISON:  CT angiogram of the chest performed Aug 31, 2023 FINDINGS: Cardiovascular: Satisfactory opacification of the pulmonary arteries to the segmental level. No evidence of pulmonary embolism. Normal heart size. No pericardial effusion. Atherosclerotic changes are present in the left coronary territory. Mediastinum/Nodes: No enlarged mediastinal, hilar, or axillary lymph nodes. Thyroid  gland, trachea, and esophagus demonstrate no significant findings. Lungs/Pleura: The previously observed airspace consolidation in the left lower lobe is modestly improved. There are areas of consolidation the right lower lobe and lingula which are also modestly improved. Upper Abdomen: Cholecystectomy. Musculoskeletal: Postsurgical changes from bilateral shoulder arthroplasty. Review of the MIP images confirms the above findings. IMPRESSION: 1. No evidence of pulmonary embolism to the subsegmental level. 2. Modest improvement in appearance of multifocal airspace disease (which is worst in the left lower lobe). Electronically Signed   By:  Reagan Camera M.D.   On: 09/04/2023 10:32   DG Chest Port 1 View Result Date: 09/04/2023 CLINICAL DATA:  Shortness of breath. EXAM: PORTABLE CHEST 1 VIEW COMPARISON:  08/31/2023 and 09/03/2023 FINDINGS: Aortic atherosclerosis. Stable cardiomediastinal contours. Persistent airspace disease within the left lower lung, unchanged from previous exam. Equivocal opacity within the periphery of the right upper lobe appears new. No signs of pleural effusion or edema. Status post bilateral shoulder arthroplasty. Postoperative changes in the lower cervical/upper thoracic spine. IMPRESSION: 1. Persistent airspace disease within the left lower lung, unchanged from previous exam. 2. Equivocal opacity within the periphery of the right upper lobe appears new and may represent a new site of pneumonia. Aortic Atherosclerosis (ICD10-I70.0). Electronically Signed   By: Kimberley Penman M.D.   On: 09/04/2023 08:45   DG CHEST PORT 1 VIEW Result Date: 09/03/2023 CLINICAL DATA:  Shortness of breath. EXAM: PORTABLE CHEST 1 VIEW COMPARISON:  08/31/2023 radiograph and CT FINDINGS: Left lung opacity greatest at the base has improved from prior exam with mild/moderate residual. No new airspace disease. Normal heart size with stable mediastinal contours. No pneumothorax or large pleural effusion. No pulmonary edema. IMPRESSION: Left lung opacity greatest at the base has improved from prior exam with mild/moderate residual. Electronically Signed   By: Chadwick Colonel M.D.   On: 09/03/2023 10:19    Procedures Procedures    Medications Ordered in ED Medications  iohexol  (OMNIPAQUE ) 350 MG/ML injection 75 mL (75 mLs Intravenous Contrast Given 09/04/23 1014)    ED Course/ Medical Decision Making/ A&P                                 Medical Decision Making Amount and/or Complexity of Data Reviewed Labs: ordered. Radiology: ordered.  Risk Prescription drug management.   Jarvis Mesa 68 y.o. presented today for hemoptysis.  Working DDx that I considered at this time includes, but not limited to, pneumonia, PE, esophageal rupture, anemia, sepsis.  R/o DDx: PE, esophageal rupture, anemia, sepsis: These are considered less likely due to history of present illness, physical exam, labs/imaging findings  Review of prior external notes: 07/28/2023 office visit  Unique Tests and My Independent Interpretation:  CBC: Unremarkable, hemoglobin has improved CMP: Mild hyponatremia  132 Lactic acid: Unremarkable Troponin: 15, 16 CTA chest: Modest improvement in pneumonia Chest x-ray: New pneumonia in the right upper lobe along with the pneumonia seen previously in the left side EKG: Sinus tach 7 bpm, no signs of right heart strain or ischemia  Social Determinants of Health: none  Discussion with Independent Historian: None  Discussion of Management of Tests: None  Risk: Medium: prescription drug management  Risk Stratification Score: None  Staffed with Martina Sledge, DO  Plan: On exam patient was no acute distress with stable vitals.  Patient was mildly tachycardic in the room at 103 when I was there however his exam was ultimately unremarkable.  Patient does not Dors any infectious symptoms but states he had 1 episode of hemoptysis which could be bronchitis from his pneumonia that he was recently here for but given that he was recently hospitalized and is elderly we will get CTA to rule out PE.  Labs and imaging are ultimately reassuring.  CTA shows modest improvement in the pneumonia as it appears last time it was in bilateral lungs as well.  Patient has stable vitals and is not hypoxic and is currently asymptomatic with improving blood count from last time and so do feel he safe to be discharged and follow-up outpatient.  Will have him continue taking his antibiotics and follow-up closely.  Patient was given return precautions. Patient stable for discharge at this time.  Patient verbalized understanding of plan.  This chart  was dictated using voice recognition software.  Despite best efforts to proofread,  errors can occur which can change the documentation meaning.         Final Clinical Impression(s) / ED Diagnoses Final diagnoses:  Hemoptysis    Rx / DC Orders ED Discharge Orders     None         Elex Grimmer 09/04/23 1140    Russella Courts A, DO 09/08/23 0825

## 2023-09-04 NOTE — ED Notes (Signed)
 Pt verbalized understanding of discharge instructions, Patient does not have any concerns at this time, patients cousin will drive patient home.

## 2023-09-05 DIAGNOSIS — B372 Candidiasis of skin and nail: Secondary | ICD-10-CM | POA: Diagnosis not present

## 2023-09-05 DIAGNOSIS — I2510 Atherosclerotic heart disease of native coronary artery without angina pectoris: Secondary | ICD-10-CM | POA: Diagnosis not present

## 2023-09-05 DIAGNOSIS — Z09 Encounter for follow-up examination after completed treatment for conditions other than malignant neoplasm: Secondary | ICD-10-CM | POA: Diagnosis not present

## 2023-09-05 DIAGNOSIS — I10 Essential (primary) hypertension: Secondary | ICD-10-CM | POA: Diagnosis not present

## 2023-09-05 LAB — CULTURE, BLOOD (ROUTINE X 2)
Culture: NO GROWTH
Culture: NO GROWTH
Special Requests: ADEQUATE

## 2023-09-22 DIAGNOSIS — E871 Hypo-osmolality and hyponatremia: Secondary | ICD-10-CM | POA: Diagnosis not present

## 2023-09-22 DIAGNOSIS — K219 Gastro-esophageal reflux disease without esophagitis: Secondary | ICD-10-CM | POA: Diagnosis not present

## 2023-09-22 DIAGNOSIS — I10 Essential (primary) hypertension: Secondary | ICD-10-CM | POA: Diagnosis not present

## 2023-09-27 DIAGNOSIS — Q761 Klippel-Feil syndrome: Secondary | ICD-10-CM | POA: Diagnosis not present

## 2023-09-27 DIAGNOSIS — M5416 Radiculopathy, lumbar region: Secondary | ICD-10-CM | POA: Diagnosis not present

## 2023-10-03 DIAGNOSIS — I10 Essential (primary) hypertension: Secondary | ICD-10-CM | POA: Diagnosis not present

## 2023-10-26 DIAGNOSIS — Z471 Aftercare following joint replacement surgery: Secondary | ICD-10-CM | POA: Diagnosis not present

## 2023-10-26 DIAGNOSIS — Z96612 Presence of left artificial shoulder joint: Secondary | ICD-10-CM | POA: Diagnosis not present

## 2023-10-26 DIAGNOSIS — Z96611 Presence of right artificial shoulder joint: Secondary | ICD-10-CM | POA: Diagnosis not present

## 2024-01-03 DIAGNOSIS — M5416 Radiculopathy, lumbar region: Secondary | ICD-10-CM | POA: Diagnosis not present

## 2024-01-12 DIAGNOSIS — H2513 Age-related nuclear cataract, bilateral: Secondary | ICD-10-CM | POA: Diagnosis not present

## 2024-03-01 ENCOUNTER — Encounter: Payer: Self-pay | Admitting: Cardiovascular Disease

## 2024-03-01 ENCOUNTER — Ambulatory Visit: Attending: Cardiovascular Disease | Admitting: Cardiovascular Disease

## 2024-03-01 VITALS — BP 117/67 | HR 64 | Ht 70.0 in

## 2024-03-01 DIAGNOSIS — R931 Abnormal findings on diagnostic imaging of heart and coronary circulation: Secondary | ICD-10-CM

## 2024-03-01 DIAGNOSIS — R079 Chest pain, unspecified: Secondary | ICD-10-CM

## 2024-03-01 DIAGNOSIS — E782 Mixed hyperlipidemia: Secondary | ICD-10-CM

## 2024-03-01 DIAGNOSIS — R002 Palpitations: Secondary | ICD-10-CM | POA: Diagnosis not present

## 2024-03-01 DIAGNOSIS — I10 Essential (primary) hypertension: Secondary | ICD-10-CM

## 2024-03-01 MED ORDER — AMLODIPINE BESYLATE 5 MG PO TABS: 5.0000 mg | ORAL_TABLET | Freq: Every day | ORAL | 3 refills | Status: AC

## 2024-03-01 NOTE — Patient Instructions (Addendum)
 Medication Instructions:  Your physician has recommended you make the following change in your medication:   -Decrease amlodipine  (norvasc ) to 5mg  once daily.  *If you need a refill on your cardiac medications before your next appointment, please call your pharmacy*    Follow-Up: At [facility], you and your health needs are our priority.  As part of our continuing mission to provide you with exceptional heart care, our providers are all part of one team.  This team includes your primary Cardiologist (physician) and Advanced Practice Providers or APPs (Physician Assistants and Nurse Practitioners) who all work together to provide you with the care you need, when you need it.  Your next appointment:   6 month(s)  Provider:   Jon Hails, PA-C, Callie Goodrich, PA-C, Kathleen Johnson, PA-C, Damien Braver, NP, or Katlyn West, NP         Then, Dorn Lesches, MD will plan to see you again in 12 month(s).     We recommend signing up for the patient portal called MyChart.  Sign up information is provided on this After Visit Summary.  MyChart is used to connect with patients for Virtual Visits (Telemedicine).  Patients are able to view lab/test results, encounter notes, upcoming appointments, etc.  Non-urgent messages can be sent to your provider as well.   To learn more about what you can do with MyChart, go to [email or web address].   Other Instructions Dr. Lesches has requested that you schedule an appointment with one of our clinical pharmacists for a blood pressure check appointment within the next 4-6 weeks.  If you monitor your blood pressure (BP) at home, please bring your BP cuff and your BP readings with you to this appointment  HOW TO TAKE YOUR BLOOD PRESSURE: Rest 5 minutes before taking your blood pressure. Don't smoke or drink caffeinated beverages for at least 30 minutes before. Take your blood pressure before (not after) you eat. Sit comfortably with your back  supported and both feet on the floor (don't cross your legs). Elevate your arm to heart level on a table or a desk. Use the proper sized cuff. It should fit smoothly and snugly around your bare upper arm. There should be enough room to slip a fingertip under the cuff. The bottom edge of the cuff should be 1 inch above the crease of the elbow. Ideally, take 3 measurements at one sitting and record the average.

## 2024-03-01 NOTE — Progress Notes (Signed)
 03/01/2024 Dustin Carter   28-Mar-1956  [id number]  Primary Physician Clarice Nottingham, MD Primary Cardiologist: Dorn JINNY Lesches MD FACP, [place], [place], [place]  HPI:  Dustin Carter is a 68 y.o.  mildly overweight single Caucasian male children is currently on disability because of multiple back operations. I last saw him in the office 08/25/2022.  His only risk factor is treated hyperlipidemia and family history. He's never had a heart attack or stroke. I did perform a cardiac catheterization on him as an outpatient 11/02/11 which is entirely normal. This hospitalized from [address] of this month with atypical chest pain and palpitations. Event monitor showed sinus rhythm/sinus bradycardia runs of PSVT. His beta blocker was uptitrated. A 2-D echo was normal and a Myoview  stress test was low risk. Since I saw him 6 months ago he has noticed some palpitations and atypical chest pain principally since New Year's Day. A recent Holter monitor was unrevealing.   Since I saw him a year and a half ago he continues to do well.  He was started on amlodipine  by his PCP which has resulted in some lower extremity edema.  He denies chest pain or shortness of breath.  He is at goal for secondary prevention with regards to his hyperlipidemia on statin therapy.   Current Meds  Medication Sig   amitriptyline  (ELAVIL ) 150 MG tablet Take 150 mg by mouth at bedtime.   [Paused] amLODipine  (NORVASC ) 10 MG tablet Take 10 mg by mouth daily.   aspirin  81 MG chewable tablet Chew 81 mg by mouth daily.   atorvastatin  (LIPITOR) 40 MG tablet Take 40 mg by mouth at bedtime.   cyclobenzaprine  (FLEXERIL ) 10 MG tablet Take 1 tablet (10 mg total) by mouth 3 (three) times daily as needed for muscle spasms. (Patient taking differently: Take 10 mg by mouth 2 (two) times daily as needed for muscle spasms.)   metoprolol  succinate (TOPROL -XL) 100 MG 24 hr tablet TAKE 1 TABLET BY MOUTH EVERY MORNING WITH OR IMMEDIATELY FOLLOWING A MEAL (Patient  taking differently: Take 50 mg by mouth 2 (two) times daily.)   oxyCODONE -acetaminophen  (PERCOCET) 7.5-325 MG tablet Take 1 tablet by mouth every 6 (six) hours as needed for moderate pain.    pantoprazole  (PROTONIX ) 40 MG tablet Take 1 tablet (40 mg total) by mouth 2 (two) times daily before a meal. (Patient taking differently: Take 40 mg by mouth daily.)     Allergies  Allergen Reactions   Butrans [Buprenorphine] Itching         Social History   Socioeconomic History   Marital status: Single    Spouse name: Not on file   Number of children: 0   Years of education: 12   Highest education level: Not on file  Occupational History   Not on file  Tobacco Use   Smoking status: Former    Current packs/day: 0.00    Average packs/day: 1 pack/day for 12.0 years (12.0 ttl pk-yrs)    Types: Cigarettes    Start date: 04/26/1980    Quit date: 04/26/1992    Years since quitting: 31.8   Smokeless tobacco: Former    Types: Chew    Quit date: 04/26/1980   Tobacco comments:    used chew for 12 years before started smoking  Vaping Use   Vaping status: Never Used  Substance and Sexual Activity   Alcohol use: Not Currently    Comment: has one shot of Vodka before bed   Drug use:  No   Sexual activity: Never  Other Topics Concern   Not on file  Social History Narrative   Not on file   Social Drivers of Health   Financial Resource Strain: Not on file  Food Insecurity: No Food Insecurity (09/01/2023)   Hunger Vital Sign    Worried About Running Out of Food in the Last Year: Never true    Ran Out of Food in the Last Year: Never true  Transportation Needs: No Transportation Needs (09/01/2023)   PRAPARE - Administrator, Civil Service (Medical): No    Lack of Transportation (Non-Medical): No  Physical Activity: Not on file  Stress: Not on file  Social Connections: Socially Isolated (09/01/2023)   Social Connection and Isolation Panel    Frequency of Communication with Friends and  Family: Twice a week    Frequency of Social Gatherings with Friends and Family: Once a week    Attends Religious Services: Never    Database Administrator or Organizations: No    Attends Banker Meetings: Never    Marital Status: Never married  Intimate Partner Violence: Patient Declined (09/01/2023)   Humiliation, Afraid, Rape, and Kick questionnaire    Fear of Current or Ex-Partner: Patient declined    Emotionally Abused: Patient declined    Physically Abused: Patient declined    Sexually Abused: Patient declined     Review of Systems: General: negative for chills, fever, night sweats or weight changes.  Cardiovascular: negative for chest pain, dyspnea on exertion, edema, orthopnea, palpitations, paroxysmal nocturnal dyspnea or shortness of breath Dermatological: negative for rash Respiratory: negative for cough or wheezing Urologic: negative for hematuria Abdominal: negative for nausea, vomiting, diarrhea, bright red blood per rectum, melena, or hematemesis Neurologic: negative for visual changes, syncope, or dizziness All other systems reviewed and are otherwise negative except as noted above.    Blood pressure 117/67, pulse 64, height 5' 10 (1.778 m), SpO2 96%.  General appearance: alert and no distress Neck: no adenopathy, no carotid bruit, no JVD, supple, symmetrical, trachea midline, and thyroid  not enlarged, symmetric, no tenderness/mass/nodules Lungs: clear to auscultation bilaterally Heart: regular rate and rhythm, S1, S2 normal, no murmur, click, rub or gallop Extremities: 1+ lower extremity edema Pulses: 2+ and symmetric Skin: Skin color, texture, turgor normal. No rashes or lesions Neurologic: Grossly normal  EKG EKG Interpretation Date/Time:  Thursday March 01 2024 09:14:10 EST Ventricular Rate:  64 PR Interval:  178 QRS Duration:  86 QT Interval:  398 QTC Calculation: 410 R Axis:   24  Text Interpretation: Normal sinus rhythm Normal ECG  When compared with ECG of 04-Sep-2023 08:06, PREVIOUS ECG IS PRESENT Confirmed by Court Carrier [id number]) on 03/01/2024 9:59:36 AM    ASSESSMENT AND PLAN:   Hyperlipidemia History of hyperlipidemia on atorvastatin  with lipid profile performed 06/23/2023 revealing total cholesterol 123, LDL 58 and HDL of 51.  Palpitations History of palpitations with that event monitor in the past showing runs of SVT on beta-blocker.  Essential hypertension History of essential hypertension blood pressure measured today 117/67.  He is on amlodipine  which was begun by his PCP resulting in lower extremity edema as well as Benicar/hydrochlorothiazide and Toprol .  Of asked him to cut his amlodipine  in half to 5 mg and keep a 30 blood pressure log.  He will see Pharm.D. back after that to review his blood pressure log and make appropriate changes.  Elevated coronary artery calcium  score History of elevated coronary calcium  score 1269  performed 07/14/2022.  He denies chest pain.  I did perform cardiac catheterization on him 11/02/2011 which was entirely normal.  He has a goal for secondary prevention.     Dorn DOROTHA Lesches MD FACP,FACC,FAHA, [facility] 03/01/2024 10:07 AM

## 2024-03-01 NOTE — Assessment & Plan Note (Signed)
 History of palpitations with that event monitor in the past showing runs of SVT on beta-blocker.

## 2024-03-01 NOTE — Assessment & Plan Note (Signed)
 History of elevated coronary calcium  score 1269 performed 07/14/2022.  He denies chest pain.  I did perform cardiac catheterization on him 11/02/2011 which was entirely normal.  He has a goal for secondary prevention.

## 2024-03-01 NOTE — Assessment & Plan Note (Signed)
 History of essential hypertension blood pressure measured today 117/67.  He is on amlodipine  which was begun by his PCP resulting in lower extremity edema as well as Benicar/hydrochlorothiazide and Toprol .  Of asked him to cut his amlodipine  in half to 5 mg and keep a 30 blood pressure log.  He will see Pharm.D. back after that to review his blood pressure log and make appropriate changes.

## 2024-03-01 NOTE — Assessment & Plan Note (Signed)
 History of hyperlipidemia on atorvastatin  with lipid profile performed 06/23/2023 revealing total cholesterol 123, LDL 58 and HDL of 51.

## 2024-04-03 NOTE — Progress Notes (Deleted)
 Patient ID: JAHSEH LUCCHESE                 DOB: [birth year]                      MRN: [id number]      HPI: Dustin Carter is a 68 y.o. male referred by Dr. Court to HTN clinic. PMH is significant for HTN, HLD,    Patient was last evaluated by Dr. Court in November 2025. He was initated on amlodipine  by his PCP but he experienced lower extremity edema, he was instructed to cut amlodipine  in half to 5 mg dailyand keep a 30day blood pressure; BP at that visit was 117/67. He then referred to pharmD  Current HTN meds: amlodipine  5 mg daily, olmesartan-hydrochlorothiazide 40-25 mg daily, metoprolol  succinate 100 mg daily Previously tried: BP goal: < 130/80  Family History:  Relation Problem Comments  Mother Metallurgist)   Father (Deceased) Heart disease   Hyperlipidemia     Sister Metallurgist)   Brother Metallurgist)   Brother (Alive)   Brother Heart disease   Hyperlipidemia     Brother Heart disease   Hyperlipidemia     Maternal Aunt Cancer brain    Maternal Uncle Heart disease     Maternal Uncle Heart disease     Paternal Uncle Cancer liver    Maternal Grandmother (Deceased) Heart disease     Maternal Grandfather (Deceased)   Paternal Grandmother (Deceased)   Paternal Grandfather (Deceased) Stroke      Social History:  Alcohol: Smoking:   Diet:  Breakfast: Lunch/Dinner: Snacks: Beverages:  Exercise:  {types:28256}  Home BP readings:  Date SBP/DBP  HR              Average      Wt Readings from Last 3 Encounters:  09/04/23 150 lb (68 kg)  08/31/23 155 lb (70.3 kg)  08/25/22 155 lb 9.6 oz (70.6 kg)   BP Readings from Last 3 Encounters:  03/01/24 117/67  09/04/23 (!) 162/92  09/03/23 (!) 146/87   Pulse Readings from Last 3 Encounters:  03/01/24 64  09/04/23 89  09/03/23 81    Renal function: CrCl cannot be calculated (Patient's most recent lab result is older than the maximum 21 days allowed.).  Past Medical History:  Diagnosis Date   Arthritis    SPINE  AND HEAD TO TOES   Atypical chest pain at rest   cardiologist-- dr berry-- work up done per note all normal (stress test and cardiac cath)   BPH (benign prostatic hypertrophy) Mar 29, 2011   TREATED IN DR. TANNENBAUM'S OFFICE WITH HEAT THERAPY TO SHRINK THE PROSTATE   DOE (dyspnea on exertion)    Family history of adverse reaction to anesthesia    MOTHER--- HARD TO WAKE   Full dentures    GERD (gastroesophageal reflux disease)    History of kidney stones    Hyperlipidemia    Hypertension    Polyneuropathy    DUE TO POST SURGERY'S   PONV (postoperative nausea and vomiting)    hx of with first back surgery    Sinus tachycardia cardiologist- dr berry   hx palpitations w/ holter event monitor showed SR, SB, runs of PSVT-- pt takes beta blocker   Wears glasses     Current Outpatient Medications on File Prior to Visit  Medication Sig Dispense Refill   amitriptyline  (ELAVIL ) 150 MG tablet Take 150 mg by mouth at bedtime.  amLODipine  (NORVASC ) 5 MG tablet Take 1 tablet (5 mg total) by mouth daily. 90 tablet 3   aspirin  81 MG chewable tablet Chew 81 mg by mouth daily.     atorvastatin  (LIPITOR) 40 MG tablet Take 40 mg by mouth at bedtime.     cyclobenzaprine  (FLEXERIL ) 10 MG tablet Take 1 tablet (10 mg total) by mouth 3 (three) times daily as needed for muscle spasms. (Patient taking differently: Take 10 mg by mouth 2 (two) times daily as needed for muscle spasms.) 30 tablet 1   gabapentin  (NEURONTIN ) 300 MG capsule Take 300-600 mg by mouth See admin instructions. Take 300 mg by mouth in the morning and afternoon and 600 mg at night     hydrochlorothiazide (HYDRODIURIL) 12.5 MG tablet Take 12.5 mg by mouth daily. (Patient not taking: Reported on 03/01/2024)     metoprolol  succinate (TOPROL -XL) 100 MG 24 hr tablet TAKE 1 TABLET BY MOUTH EVERY MORNING WITH OR IMMEDIATELY FOLLOWING A MEAL (Patient taking differently: Take 50 mg by mouth 2 (two) times daily.) 30 tablet 7    olmesartan-hydrochlorothiazide (BENICAR HCT) 40-25 MG tablet Take 1 tablet by mouth daily. (Patient not taking: Reported on 03/01/2024)     oxyCODONE -acetaminophen  (PERCOCET) 7.5-325 MG tablet Take 1 tablet by mouth every 6 (six) hours as needed for moderate pain.      pantoprazole  (PROTONIX ) 40 MG tablet Take 1 tablet (40 mg total) by mouth 2 (two) times daily before a meal. (Patient taking differently: Take 40 mg by mouth daily.) 60 tablet 0   No current facility-administered medications on file prior to visit.    Allergies  Allergen Reactions   Butrans [Buprenorphine] Itching         There were no vitals taken for this visit.   Assessment/Plan:  1. Hypertension -  No problem-specific Assessment & Plan notes found for this encounter.      Thank you  Myleah Cavendish E. Kreston Ahrendt, Pharm.D [place] [facility]. [facility] [address], [place], [place] [address] Phone: [phone]; Fax: [phone]

## 2024-04-05 ENCOUNTER — Ambulatory Visit

## 2024-06-11 ENCOUNTER — Ambulatory Visit: Admitting: Pharmacist Clinician (PhC)/ Clinical Pharmacy Specialist
